# Patient Record
Sex: Male | Born: 1955 | Race: Black or African American | Hispanic: No | Marital: Married | Smoking: Former smoker
Health system: Southern US, Community
[De-identification: ages and names within clinical notes are randomized; demographics above are authoritative.]

## PROBLEM LIST (undated history)

## (undated) DIAGNOSIS — E119 Type 2 diabetes mellitus without complications: Secondary | ICD-10-CM

## (undated) DIAGNOSIS — E785 Hyperlipidemia, unspecified: Secondary | ICD-10-CM

## (undated) DIAGNOSIS — I10 Essential (primary) hypertension: Secondary | ICD-10-CM

## (undated) HISTORY — DX: Hyperlipidemia, unspecified: E78.5

## (undated) HISTORY — DX: Type 2 diabetes mellitus without complications: E11.9

---

## 1982-07-28 HISTORY — PX: CYST EXCISION PERINEAL: SHX6278

## 1998-12-09 ENCOUNTER — Inpatient Hospital Stay (HOSPITAL_COMMUNITY): Admission: EM | Admit: 1998-12-09 | Discharge: 1998-12-10 | Payer: Self-pay | Admitting: Emergency Medicine

## 2009-05-01 ENCOUNTER — Ambulatory Visit: Payer: Self-pay | Admitting: Oncology

## 2009-05-28 LAB — CBC WITH DIFFERENTIAL (CANCER CENTER ONLY)
BASO%: 0.4 % (ref 0.0–2.0)
LYMPH#: 2 10*3/uL (ref 0.9–3.3)
LYMPH%: 36.7 % (ref 14.0–48.0)
MONO#: 0.3 10*3/uL (ref 0.1–0.9)
NEUT#: 3 10*3/uL (ref 1.5–6.5)
Platelets: 145 10*3/uL (ref 145–400)
RDW: 12.3 % (ref 10.5–14.6)
WBC: 5.4 10*3/uL (ref 4.0–10.0)

## 2009-05-28 LAB — CMP (CANCER CENTER ONLY)
AST: 30 U/L (ref 11–38)
Albumin: 4 g/dL (ref 3.3–5.5)
Alkaline Phosphatase: 65 U/L (ref 26–84)
BUN, Bld: 10 mg/dL (ref 7–22)
Potassium: 4 mEq/L (ref 3.3–4.7)
Sodium: 143 mEq/L (ref 128–145)
Total Protein: 7.3 g/dL (ref 6.4–8.1)

## 2009-05-28 LAB — MORPHOLOGY - CHCC SATELLITE: PLT EST ~~LOC~~: ADEQUATE

## 2009-05-28 LAB — URINALYSIS, MICROSCOPIC (CHCC SATELLITE)
Bacteria, UA: NEGATIVE
Glucose: NEGATIVE g/dL
Ketones: NEGATIVE mg/dL
Specific Gravity, Urine: 1.015 (ref 1.003–1.035)
WBC: NEGATIVE (ref 0–2)

## 2009-06-01 LAB — IRON AND TIBC
%SAT: 25 % (ref 20–55)
Iron: 75 ug/dL (ref 42–165)
TIBC: 305 ug/dL (ref 215–435)

## 2009-06-01 LAB — HAPTOGLOBIN: Haptoglobin: 165 mg/dL (ref 16–200)

## 2009-06-01 LAB — VITAMIN B12: Vitamin B-12: 285 pg/mL (ref 211–911)

## 2009-06-01 LAB — HEMOGLOBINOPATHY EVALUATION
Hgb A: 55.8 % — ABNORMAL LOW (ref 96.8–97.8)
Hgb F Quant: 0 % (ref 0.0–2.0)

## 2009-06-01 LAB — PROTEIN ELECTROPHORESIS, SERUM
Albumin ELP: 62.2 % (ref 55.8–66.1)
Alpha-1-Globulin: 3.9 % (ref 2.9–4.9)
Beta 2: 4 % (ref 3.2–6.5)
Beta Globulin: 6.1 % (ref 4.7–7.2)
Gamma Globulin: 14.8 % (ref 11.1–18.8)

## 2009-06-01 LAB — FERRITIN: Ferritin: 417 ng/mL — ABNORMAL HIGH (ref 22–322)

## 2009-06-01 LAB — ERYTHROPOIETIN: Erythropoietin: 17.9 m[IU]/mL (ref 2.6–34.0)

## 2009-06-01 LAB — RETICULOCYTES (CHCC): RBC.: 4.42 MIL/uL (ref 4.22–5.81)

## 2009-06-20 ENCOUNTER — Ambulatory Visit: Payer: Self-pay | Admitting: Oncology

## 2011-10-21 ENCOUNTER — Ambulatory Visit (INDEPENDENT_AMBULATORY_CARE_PROVIDER_SITE_OTHER): Payer: BC Managed Care – PPO | Admitting: Internal Medicine

## 2011-10-21 VITALS — BP 172/113 | HR 69 | Temp 98.0°F | Resp 16 | Ht 67.5 in | Wt 246.8 lb

## 2011-10-21 DIAGNOSIS — M549 Dorsalgia, unspecified: Secondary | ICD-10-CM

## 2011-10-21 DIAGNOSIS — D573 Sickle-cell trait: Secondary | ICD-10-CM | POA: Insufficient documentation

## 2011-10-21 DIAGNOSIS — I10 Essential (primary) hypertension: Secondary | ICD-10-CM

## 2011-10-21 DIAGNOSIS — Z6838 Body mass index (BMI) 38.0-38.9, adult: Secondary | ICD-10-CM | POA: Insufficient documentation

## 2011-10-21 DIAGNOSIS — E782 Mixed hyperlipidemia: Secondary | ICD-10-CM

## 2011-10-21 DIAGNOSIS — E785 Hyperlipidemia, unspecified: Secondary | ICD-10-CM | POA: Insufficient documentation

## 2011-10-21 LAB — LIPID PANEL
HDL: 39 mg/dL — ABNORMAL LOW (ref >39)
LDL Cholesterol: 139 mg/dL — ABNORMAL HIGH (ref 0–99)
Total CHOL/HDL Ratio: 5.8 Ratio
Triglycerides: 237 mg/dL — ABNORMAL HIGH (ref <150)
VLDL: 47 mg/dL — ABNORMAL HIGH (ref 0–40)

## 2011-10-21 LAB — POCT CBC
MCH, POC: 28.1 pg (ref 27–31.2)
MCV: 87 fL (ref 80–97)
MID (cbc): 0.3 (ref 0–0.9)
POC LYMPH PERCENT: 47 %L (ref 10–50)
Platelet Count, POC: 168 10*3/uL (ref 142–424)
RBC: 4.38 M/uL — AB (ref 4.69–6.13)
WBC: 5.7 10*3/uL (ref 4.6–10.2)

## 2011-10-21 LAB — COMPREHENSIVE METABOLIC PANEL
Alkaline Phosphatase: 64 U/L (ref 39–117)
BUN: 10 mg/dL (ref 6–23)
Glucose, Bld: 76 mg/dL (ref 70–99)
Sodium: 144 mEq/L (ref 135–145)
Total Bilirubin: 0.6 mg/dL (ref 0.3–1.2)

## 2011-10-21 MED ORDER — MELOXICAM 15 MG PO TABS: 15.0000 mg | ORAL_TABLET | Freq: Every day | ORAL | Status: DC

## 2011-10-21 MED ORDER — LISINOPRIL-HYDROCHLOROTHIAZIDE 20-12.5 MG PO TABS: 1.0000 | ORAL_TABLET | Freq: Every day | ORAL | Status: DC

## 2011-10-21 MED ORDER — SIMVASTATIN 40 MG PO TABS: 40.0000 mg | ORAL_TABLET | Freq: Every evening | ORAL | Status: DC

## 2011-10-21 MED ORDER — CYCLOBENZAPRINE HCL 10 MG PO TABS: 10.0000 mg | ORAL_TABLET | Freq: Every day | ORAL | Status: AC

## 2011-10-21 MED ORDER — LISINOPRIL 20 MG PO TABS: 20.0000 mg | ORAL_TABLET | Freq: Every day | ORAL | Status: DC
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
## 2011-10-21 NOTE — Patient Instructions (Signed)
Back Exercises Back exercises help treat and prevent back injuries. The goal of back exercises is to increase the strength of your abdominal and back muscles and the flexibility of your back. These exercises should be started when you no longer have back pain. Back exercises include:  Pelvic Tilt. Lie on your back with your knees bent. Tilt your pelvis until the lower part of your back is against the floor. Hold this position 5 to 10 sec and repeat 5 to 10 times.   Knee to Chest. Pull first 1 knee up against your chest and hold for 20 to 30 seconds, repeat this with the other knee, and then both knees. This may be done with the other leg straight or bent, whichever feels better.   Sit-Ups or Curl-Ups. Bend your knees 90 degrees. Start with tilting your pelvis, and do a partial, slow sit-up, lifting your trunk only 30 to 45 degrees off the floor. Take at least 2 to 3 seconds for each sit-up. Do not do sit-ups with your knees out straight. If partial sit-ups are difficult, simply do the above but with only tightening your abdominal muscles and holding it as directed.   Hip-Lift. Lie on your back with your knees flexed 90 degrees. Push down with your feet and shoulders as you raise your hips a couple inches off the floor; hold for 10 seconds, repeat 5 to 10 times.   Back arches. Lie on your stomach, propping yourself up on bent elbows. Slowly press on your hands, causing an arch in your low back. Repeat 3 to 5 times. Any initial stiffness and discomfort should lessen with repetition over time.   Shoulder-Lifts. Lie face down with arms beside your body. Keep hips and torso pressed to floor as you slowly lift your head and shoulders off the floor.  Do not overdo your exercises, especially in the beginning. Exercises may cause you some mild back discomfort which lasts for a few minutes; however, if the pain is more severe, or lasts for more than 15 minutes, do not continue exercises until you see your  caregiver. Improvement with exercise therapy for back problems is slow.  See your caregivers for assistance with developing a proper back exercise program. Document Released: 08/21/2004 Document Revised: 07/03/2011 Document Reviewed: 07/14/2005 ExitCare Patient Information 2012 [employer]. 

## 2011-10-28 ENCOUNTER — Encounter: Payer: Self-pay | Admitting: Internal Medicine

## 2012-05-22 ENCOUNTER — Other Ambulatory Visit: Payer: Self-pay | Admitting: Internal Medicine

## 2012-06-25 ENCOUNTER — Other Ambulatory Visit: Payer: Self-pay | Admitting: Internal Medicine

## 2012-07-17 ENCOUNTER — Other Ambulatory Visit: Payer: Self-pay | Admitting: Physician Assistant

## 2012-07-22 ENCOUNTER — Other Ambulatory Visit: Payer: Self-pay | Admitting: Physician Assistant

## 2012-08-26 ENCOUNTER — Other Ambulatory Visit: Payer: Self-pay | Admitting: Physician Assistant

## 2012-08-26 NOTE — Telephone Encounter (Signed)
4th notice - pt way overdue for visit and labs

## 2012-09-25 ENCOUNTER — Other Ambulatory Visit: Payer: Self-pay | Admitting: Internal Medicine

## 2012-10-05 ENCOUNTER — Other Ambulatory Visit: Payer: Self-pay | Admitting: Internal Medicine

## 2012-10-05 ENCOUNTER — Ambulatory Visit (INDEPENDENT_AMBULATORY_CARE_PROVIDER_SITE_OTHER): Payer: BC Managed Care – PPO | Admitting: Emergency Medicine

## 2012-10-05 VITALS — BP 180/110 | HR 77 | Temp 98.1°F | Resp 18 | Wt 252.0 lb

## 2012-10-05 DIAGNOSIS — S83207A Unspecified tear of unspecified meniscus, current injury, left knee, initial encounter: Secondary | ICD-10-CM

## 2012-10-05 DIAGNOSIS — I10 Essential (primary) hypertension: Secondary | ICD-10-CM

## 2012-10-05 DIAGNOSIS — IMO0002 Reserved for concepts with insufficient information to code with codable children: Secondary | ICD-10-CM

## 2012-10-05 MED ORDER — LISINOPRIL 20 MG PO TABS: 20.0000 mg | ORAL_TABLET | Freq: Every day | ORAL | Status: DC

## 2012-10-05 MED ORDER — LISINOPRIL-HYDROCHLOROTHIAZIDE 20-12.5 MG PO TABS: 1.0000 | ORAL_TABLET | Freq: Every day | ORAL | Status: DC

## 2012-10-05 MED ORDER — HYDROCODONE-ACETAMINOPHEN 5-500 MG PO TABS: 1.0000 | ORAL_TABLET | ORAL | Status: DC | PRN

## 2012-10-05 NOTE — Patient Instructions (Addendum)
Knee, Cartilage and its Function  The menisci are made of tough cartilage, and fit between the surfaces of the bones upon which they rest. The menisci are semi lunar (C-shaped) and have a wedged profile. The wedged profile helps the stability of the joint by keeping the rounded femur surface from sliding off the flat tibial surface. The menisci are nourished (fed) by small blood vessels; but there is also a large area in the center of the meniscus that does not have a good blood supply (avascular). This presents a problem when there is an injury to the meniscus, as areas without good blood supply heal poorly. When there is a torn cartilage in the knee, surgery is often required to fix this. This is usually done with an arthroscopy (a surgical procedure less invasive than open surgery). Some times open surgery of the knee is required if there is other damage which has occurred.  The medial meniscus rests on the medial tibial plateau. The tibia is the large bone in your lower leg (the shin bone). The medial tibial plateau is the upper end of the bone making up the inner part of your knee. The lateral meniscus serves the same purpose and is located on the outside of the knee. The menisci help to distribute your body weight across the knee joint. Without the meniscus present, the weight of your body would be unevenly applied to the bones in your legs (the femur and tibia). The femur is the large bone in your thigh. This uneven weight distribution would cause increased wear and tear on the cartilage covering the bones, leading to early damage of these areas. The presence of the menisci cartilage is necessary for a healthy knee.  PURPOSE OF THE KNEE CARTILAGE   The knee joint is made up of three bones: the thigh bone (femur), the shin bone (tibia), and the knee cap (patella). The surfaces of these bones at the knee joint are covered with cartilage. This smooth, slippery surface allows the bones to slide against each other without causing bone damage. The meniscus sits between these cartilaginous surfaces of the bones (femur and tibia). It distributes the weight evenly in the joints and helps with the stability of the joint (keeps the joint steady).  HOME CARE INSTRUCTIONS  After surgery:   Use crutches as instructed.   Once home, an ice pack applied to your injury may help with discomfort and keep the swelling down. An ice pack can be used for 15 to 20 minutes 3 to 4 times per day for the first 2-3 days or as instructed.   Only take over-the-counter or prescription medicines for pain, discomfort, or fever as directed by your caregiver.   Call if you do not have relief of pain with medications or if there is an increase in pain.   Call if your foot becomes cold or blue.   Call if your knee gets stuck in a fixed position and will not move.   You may resume normal diet and activities as directed.   Make sure to keep your appointment with your follow-up caregiver. This injury may require further evaluation and treatment beyond the treatment you received today.  MAKE SURE YOU:     Understand these instructions.   Will watch your condition.   Will get help right away if you are not doing well or get worse.  Document Released: 01/03/2002 Document Revised: 10/06/2011 Document Reviewed: 05/16/2009  ExitCare Patient Information 2013 [employer].

## 2012-10-05 NOTE — Progress Notes (Signed)
[facility] [address], [place] [address] [phone]  Date:  10/05/2012   Name:  Alexander Cochran   DOB:  04-Aug-1955   MRN:  [id number]  PCP:  No primary Laverna Dossett on file.    Chief Complaint: Knee Pain   History of Present Illness:  Alexander Cochran is a 57 y.o. very pleasant male patient who presents with the following:  Sitting in chair Saturday evening and stood up to go to another room.  He felt an immediate pain and swelling in his left knee.  Has been unable to easily bear weight since.  Denies prior knee injury or other complaint.  No improvement last night with ace and icey hot.  Ran out of antihypertensive medication over a week ago. No symptoms.  Patient Active Problem List  Diagnosis  . BMI 38.0-38.9,adult  . Hemoglobin S-A disorder  . Hyperlipemia  . HTN (hypertension)    No past medical history on file.  No past surgical history on file.  History  Substance Use Topics  . Smoking status: Former Games developer  . Smokeless tobacco: Not on file  . Alcohol Use: Yes    Family History  Problem Relation Age of Onset  . Hypertension Sister   . Hypertension Brother     No Known Allergies  Medication list has been reviewed and updated.  Current Outpatient Prescriptions on File Prior to Visit  Medication Sig Dispense Refill  . lisinopril (PRINIVIL,ZESTRIL) 20 MG tablet Take 1 tablet (20 mg total) by mouth daily. NEED VISIT - 4th NOTICE!!!  15 tablet  0  . lisinopril-hydrochlorothiazide (PRINZIDE,ZESTORETIC) 20-12.5 MG per tablet TAKE 1 TABLET BY MOUTH EVERY DAY  30 tablet  0  . simvastatin (ZOCOR) 40 MG tablet Take 1 tablet (40 mg total) by mouth at bedtime. NEED VISIT - 4th NOTICE!!!  15 tablet  0   No current facility-administered medications on file prior to visit.    Review of Systems:  As per HPI, otherwise negative.    Physical Examination: Filed Vitals:   10/05/12 1030  BP: 180/110  Pulse: 77  Temp: 98.1 F (36.7 C)   Resp: 18   Filed Vitals:   10/05/12 1030  Weight: 252 lb (114.306 kg)   Body mass index is 38.86 kg/(m^2). Ideal Body Weight:     GEN: WDWN, NAD, Non-toxic, Alert & Oriented x 3 HEENT: Atraumatic, Normocephalic.  Ears and Nose: No external deformity. EXTR: No clubbing/cyanosis/edema NEURO: Normal gait.  PSYCH: Normally interactive. Conversant. Not depressed or anxious appearing.  Calm demeanor.  LEFT KNEE:  Moderate effusion.  Guards extension past 160 degrees or flexion past 120 degrees.  Joint stable grossly  Assessment and Plan: Meniscus tear left knee Crutches Knee immobilizer vicodin Hypertension Non compliance Refill meds Follow up in one month  Carmelina Dane, MD

## 2012-11-07 ENCOUNTER — Other Ambulatory Visit: Payer: Self-pay | Admitting: Emergency Medicine

## 2012-11-07 ENCOUNTER — Other Ambulatory Visit: Payer: Self-pay | Admitting: Physician Assistant

## 2013-01-08 ENCOUNTER — Other Ambulatory Visit: Payer: Self-pay | Admitting: Physician Assistant

## 2013-03-05 ENCOUNTER — Other Ambulatory Visit: Payer: Self-pay | Admitting: Physician Assistant

## 2013-04-10 ENCOUNTER — Other Ambulatory Visit: Payer: Self-pay | Admitting: Emergency Medicine

## 2013-04-10 ENCOUNTER — Other Ambulatory Visit: Payer: Self-pay | Admitting: Physician Assistant

## 2013-04-10 NOTE — Telephone Encounter (Signed)
Needs OV, labs 

## 2013-05-07 ENCOUNTER — Other Ambulatory Visit: Payer: Self-pay | Admitting: Physician Assistant

## 2013-08-31 ENCOUNTER — Ambulatory Visit (INDEPENDENT_AMBULATORY_CARE_PROVIDER_SITE_OTHER): Payer: BC Managed Care – PPO | Admitting: Emergency Medicine

## 2013-08-31 VITALS — BP 202/120 | HR 77 | Temp 98.4°F | Resp 18 | Wt 262.0 lb

## 2013-08-31 DIAGNOSIS — Z9114 Patient's other noncompliance with medication regimen: Secondary | ICD-10-CM

## 2013-08-31 DIAGNOSIS — Z91199 Patient's noncompliance with other medical treatment and regimen due to unspecified reason: Secondary | ICD-10-CM

## 2013-08-31 DIAGNOSIS — Z9119 Patient's noncompliance with other medical treatment and regimen: Secondary | ICD-10-CM

## 2013-08-31 DIAGNOSIS — I10 Essential (primary) hypertension: Secondary | ICD-10-CM

## 2013-08-31 DIAGNOSIS — Z6838 Body mass index (BMI) 38.0-38.9, adult: Secondary | ICD-10-CM

## 2013-08-31 DIAGNOSIS — E785 Hyperlipidemia, unspecified: Secondary | ICD-10-CM

## 2013-08-31 LAB — COMPREHENSIVE METABOLIC PANEL
ALBUMIN: 4.7 g/dL (ref 3.5–5.2)
ALK PHOS: 66 U/L (ref 39–117)
ALT: 23 U/L (ref 0–53)
AST: 17 U/L (ref 0–37)
BUN: 11 mg/dL (ref 6–23)
CO2: 27 mEq/L (ref 19–32)
Calcium: 9.7 mg/dL (ref 8.4–10.5)
Chloride: 106 mEq/L (ref 96–112)
Creat: 1.16 mg/dL (ref 0.50–1.35)
GLUCOSE: 93 mg/dL (ref 70–99)
Potassium: 4.1 mEq/L (ref 3.5–5.3)
Sodium: 144 mEq/L (ref 135–145)
Total Bilirubin: 0.7 mg/dL (ref 0.2–1.2)
Total Protein: 7.8 g/dL (ref 6.0–8.3)

## 2013-08-31 LAB — POCT CBC
Granulocyte percent: 50.4 %G (ref 37–80)
HEMATOCRIT: 45 % (ref 43.5–53.7)
HEMOGLOBIN: 14.1 g/dL (ref 14.1–18.1)
Lymph, poc: 2 (ref 0.6–3.4)
MCH, POC: 29.2 pg (ref 27–31.2)
MCHC: 31.3 g/dL — AB (ref 31.8–35.4)
MCV: 93.1 fL (ref 80–97)
MID (cbc): 0.2 (ref 0–0.9)
MPV: 13.1 fL (ref 0–99.8)
POC GRANULOCYTE: 2.3 (ref 2–6.9)
POC LYMPH PERCENT: 44.2 %L (ref 10–50)
POC MID %: 5.4 % (ref 0–12)
Platelet Count, POC: 153 10*3/uL (ref 142–424)
RBC: 4.83 M/uL (ref 4.69–6.13)
RDW, POC: 13.5 %
WBC: 4.5 10*3/uL — AB (ref 4.6–10.2)

## 2013-08-31 LAB — POCT URINALYSIS DIPSTICK
BILIRUBIN UA: NEGATIVE
Blood, UA: NEGATIVE
GLUCOSE UA: NEGATIVE
Ketones, UA: NEGATIVE
LEUKOCYTES UA: NEGATIVE
NITRITE UA: NEGATIVE
PH UA: 5.5
Protein, UA: NEGATIVE
Spec Grav, UA: 1.02
Urobilinogen, UA: 0.2

## 2013-08-31 LAB — LIPID PANEL
Cholesterol: 237 mg/dL — ABNORMAL HIGH (ref 0–200)
HDL: 40 mg/dL (ref >39)
LDL Cholesterol: 137 mg/dL — ABNORMAL HIGH (ref 0–99)
TRIGLYCERIDES: 300 mg/dL — AB (ref <150)
Total CHOL/HDL Ratio: 5.9 Ratio
VLDL: 60 mg/dL — ABNORMAL HIGH (ref 0–40)

## 2013-08-31 MED ORDER — LISINOPRIL-HYDROCHLOROTHIAZIDE 20-12.5 MG PO TABS: 1.0000 | ORAL_TABLET | Freq: Every day | ORAL | Status: DC

## 2013-08-31 MED ORDER — SIMVASTATIN 40 MG PO TABS: 40.0000 mg | ORAL_TABLET | Freq: Every day | ORAL | Status: DC

## 2013-08-31 NOTE — Patient Instructions (Signed)

## 2013-08-31 NOTE — Progress Notes (Signed)
[facility] [address] [place] [address] [phone]  Date:  08/31/2013   Name:  Alexander Cochran   DOB:  03-27-1956   MRN:  [id number]  PCP:  No primary provider on file.    Chief Complaint: Medication Refill   History of Present Illness:  Alexander Cochran is a 58 y.o. very pleasant male patient who presents with the following:  History of uncontrolled HBP.  Last March BP was 180/110 and today is 200/120.  Not symptomatic.  Not compliant.  Out of meds for 1 month.  Fasting.  Non smoker.  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  He was informed.  No improvement with over the counter medications or other home remedies. Denies other complaint or health concern today.   Patient Active Problem List   Diagnosis Date Noted  . BMI 38.0-38.9,adult 10/21/2011  . 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  . Hyperlipemia 10/21/2011  . HTN (hypertension) 10/21/2011    No past medical history on file.  No past surgical history on file.  History  Substance Use Topics  . Smoking status: Former Research scientist (life sciences)  . Smokeless tobacco: Not on file  . Alcohol Use: Yes    Family History  Problem Relation Age of Onset  . Hypertension Sister   . Hypertension Brother     No Known Allergies  Medication list has been reviewed and updated.  Current Outpatient Prescriptions on File Prior to Visit  Medication Sig Dispense Refill  . lisinopril-hydrochlorothiazide (PRINZIDE,ZESTORETIC) 20-12.5 MG per tablet Take 1 tablet by mouth daily. NEED VISIT, LABS!!  30 tablet  0  . simvastatin (ZOCOR) 40 MG tablet Take 1 tablet (40 mg total) by mouth at bedtime. NEED VISIT, LABS!!  30 tablet  0   No current facility-administered medications on file prior to visit.    Review of Systems:  As per HPI, otherwise negative.    Physical Examination: Filed Vitals:   08/31/13 0907  BP: 202/120  Pulse: 77  Temp: 98.4 F (36.9 C)  Resp: 18   Filed Vitals:   08/31/13  0907  Weight: 262 lb (118.842 kg)   Body mass index is 40.41 kg/(m^2). Ideal Body Weight:    GEN: very obese, NAD, Non-toxic, A & O x 3 HEENT: Atraumatic, Normocephalic. Neck supple. No masses, No LAD. Ears and Nose: No external deformity. CV: RRR, No M/G/R. No JVD. No thrill. No extra heart sounds. PULM: CTA B, no wheezes, crackles, rhonchi. No retractions. No resp. distress. No accessory muscle use. ABD: S, NT, ND, +BS. No rebound. No HSM. EXTR: No c/c/e NEURO Normal gait.  PSYCH: Normally interactive. Conversant. Not depressed or anxious appearing.  Calm demeanor.    Assessment and Plan: Hypertension.  Refill meds and follow up in a month for recheck Hyperlipidemia Non compliance Overweight Follow up in the next thirty days   Signed,  Ellison Carwin, MD

## 2013-09-01 ENCOUNTER — Telehealth: Payer: Self-pay | Admitting: Radiology

## 2013-09-01 LAB — TSH: TSH: 0.955 u[IU]/mL (ref 0.350–4.500)

## 2013-09-01 LAB — PSA: PSA: 0.72 ng/mL (ref <=4.00)

## 2013-10-05 ENCOUNTER — Encounter: Payer: Self-pay | Admitting: Emergency Medicine

## 2013-10-24 ENCOUNTER — Other Ambulatory Visit: Payer: Self-pay | Admitting: Emergency Medicine

## 2013-11-22 ENCOUNTER — Other Ambulatory Visit: Payer: Self-pay | Admitting: Emergency Medicine

## 2013-12-04 ENCOUNTER — Ambulatory Visit (INDEPENDENT_AMBULATORY_CARE_PROVIDER_SITE_OTHER): Payer: BC Managed Care – PPO | Admitting: Family Medicine

## 2013-12-04 VITALS — BP 124/84 | HR 87 | Temp 98.2°F | Resp 14 | Ht 66.5 in | Wt 262.0 lb

## 2013-12-04 DIAGNOSIS — I10 Essential (primary) hypertension: Secondary | ICD-10-CM

## 2013-12-04 DIAGNOSIS — E785 Hyperlipidemia, unspecified: Secondary | ICD-10-CM

## 2013-12-04 LAB — COMPLETE METABOLIC PANEL WITH GFR
ALT: 24 U/L (ref 0–53)
AST: 18 U/L (ref 0–37)
CO2: 28 mEq/L (ref 19–32)
Calcium: 9.4 mg/dL (ref 8.4–10.5)
Chloride: 106 mEq/L (ref 96–112)
Creat: 1.42 mg/dL — ABNORMAL HIGH (ref 0.50–1.35)
GFR, Est African American: 63 mL/min
GFR, Est Non African American: 55 mL/min — ABNORMAL LOW
Sodium: 143 mEq/L (ref 135–145)
Total Protein: 6.9 g/dL (ref 6.0–8.3)

## 2013-12-04 LAB — COMPLETE METABOLIC PANEL WITHOUT GFR
Albumin: 4.6 g/dL (ref 3.5–5.2)
Alkaline Phosphatase: 61 U/L (ref 39–117)
BUN: 17 mg/dL (ref 6–23)
Glucose, Bld: 105 mg/dL — ABNORMAL HIGH (ref 70–99)
Potassium: 3.8 meq/L (ref 3.5–5.3)
Total Bilirubin: 0.6 mg/dL (ref 0.2–1.2)

## 2013-12-04 LAB — LIPID PANEL
Cholesterol: 137 mg/dL (ref 0–200)
HDL: 32 mg/dL — ABNORMAL LOW (ref >39)
LDL Cholesterol: 67 mg/dL (ref 0–99)
Total CHOL/HDL Ratio: 4.3 ratio
Triglycerides: 188 mg/dL — ABNORMAL HIGH (ref <150)
VLDL: 38 mg/dL (ref 0–40)

## 2013-12-04 LAB — MICROALBUMIN, URINE: Microalb, Ur: 0.5 mg/dL (ref 0.00–1.89)

## 2013-12-04 MED ORDER — LISINOPRIL-HYDROCHLOROTHIAZIDE 20-12.5 MG PO TABS: 1.0000 | ORAL_TABLET | Freq: Every day | ORAL | Status: DC

## 2013-12-04 MED ORDER — SIMVASTATIN 40 MG PO TABS: 40.0000 mg | ORAL_TABLET | Freq: Every day | ORAL | Status: DC

## 2013-12-04 NOTE — Progress Notes (Signed)
Chief Complaint:  Chief Complaint  Patient presents with  . Follow-up  . Medication Refill    HPI: Alexander Cochran is a 58 y.o. male who is here for : Hyperlipidemia since 1980 HTN  Since 1980 Strong family hx of HTN,no SEs He has been compliant, last does of meds was yeaterday morning He has no SE, denies CP, SOB, n/v/abd pain, msk pain No prior h.o MI/CVA HE does not take BP, he says he does but has not gotten a new machine, he tries to follow a low fat diet, not exercising right now.    Past Medical History  Diagnosis Date  . Hyperlipidemia    History reviewed. No pertinent past surgical history. History   Social History  . Marital Status: Married    Spouse Name: N/A    Number of Children: N/A  . Years of Education: N/A   Social History Main Topics  . Smoking status: Former Research scientist (life sciences)  . Smokeless tobacco: Never Used  . Alcohol Use: Yes  . Drug Use: No  . Sexual Activity: No   Other Topics Concern  . None   Social History Narrative  . None   Family History  Problem Relation Age of Onset  . Hypertension Sister   . Hypertension Brother    No Known Allergies Prior to Admission medications   Medication Sig Start Date End Date Taking? Authorizing Provider  lisinopril-hydrochlorothiazide (PRINZIDE,ZESTORETIC) 20-12.5 MG per tablet Take 1 tablet by mouth daily. PATIENT NEEDS OFFICE VISIT FOR ADDITIONAL REFILLS 10/24/13  Yes Ellison Carwin, MD  simvastatin (ZOCOR) 40 MG tablet Take 1 tablet (40 mg total) by mouth daily. PATIENT NEEDS OFFICE VISIT FOR ADDITIONAL REFILLS 10/24/13  Yes Ellison Carwin, MD     ROS: The patient denies fevers, chills, night sweats, unintentional weight loss, chest pain, palpitations, wheezing, dyspnea on exertion, nausea, vomiting, abdominal pain, dysuria, hematuria, melena, numbness, weakness, or tingling.   All other systems have been reviewed and were otherwise negative with the exception of those mentioned in the HPI and as  above.    PHYSICAL EXAM: Filed Vitals:   12/04/13 1022  BP: 124/84  Pulse: 87  Temp: 98.2 F (36.8 C)  Resp: 14   Filed Vitals:   12/04/13 1022  Height: 5' 6.5" (1.689 m)  Weight: 262 lb (118.842 kg)   Body mass index is 41.66 kg/(m^2).  General: Alert, no acute distress, obese AA male HEENT:  Normocephalic, atraumatic, oropharynx patent. EOMI, PERRLA Cardiovascular:  Regular rate and rhythm, no rubs murmurs or gallops.  No Carotid bruits, radial pulse intact. No pedal edema.  Respiratory: Clear to auscultation bilaterally.  No wheezes, rales, or rhonchi.  No cyanosis, no use of accessory musculature GI: No organomegaly, abdomen is soft and non-tender, positive bowel sounds.  No masses. Skin: No rashes. Neurologic: Facial musculature symmetric. Psychiatric: Patient is appropriate throughout our interaction. Lymphatic: No cervical lymphadenopathy Musculoskeletal: Gait intact.   LABS: Results for orders placed in visit on 08/31/13  COMPREHENSIVE METABOLIC PANEL      Result Value Ref Range   Sodium 144  135 - 145 mEq/L   Potassium 4.1  3.5 - 5.3 mEq/L   Chloride 106  96 - 112 mEq/L   CO2 27  19 - 32 mEq/L   Glucose, Bld 93  70 - 99 mg/dL   BUN 11  6 - 23 mg/dL   Creat 1.16  0.50 - 1.35 mg/dL   Total Bilirubin 0.7  0.2 - 1.2  mg/dL   Alkaline Phosphatase 66  39 - 117 U/L   AST 17  0 - 37 U/L   ALT 23  0 - 53 U/L   Total Protein 7.8  6.0 - 8.3 g/dL   Albumin 4.7  3.5 - 5.2 g/dL   Calcium 9.7  8.4 - 10.5 mg/dL  LIPID PANEL      Result Value Ref Range   Cholesterol 237 (*) 0 - 200 mg/dL   Triglycerides 300 (*) <150 mg/dL   HDL 40  >39 mg/dL   Total CHOL/HDL Ratio 5.9     VLDL 60 (*) 0 - 40 mg/dL   LDL Cholesterol 137 (*) 0 - 99 mg/dL  TSH      Result Value Ref Range   TSH 0.955  0.350 - 4.500 uIU/mL  PSA      Result Value Ref Range   PSA 0.72  <=4.00 ng/mL  POCT CBC      Result Value Ref Range   WBC 4.5 (*) 4.6 - 10.2 K/uL   Lymph, poc 2.0  0.6 - 3.4   POC  LYMPH PERCENT 44.2  10 - 50 %L   MID (cbc) 0.2  0 - 0.9   POC MID % 5.4  0 - 12 %M   POC Granulocyte 2.3  2 - 6.9   Granulocyte percent 50.4  37 - 80 %G   RBC 4.83  4.69 - 6.13 M/uL   Hemoglobin 14.1  14.1 - 18.1 g/dL   HCT, POC 45.0  43.5 - 53.7 %   MCV 93.1  80 - 97 fL   MCH, POC 29.2  27 - 31.2 pg   MCHC 31.3 (*) 31.8 - 35.4 g/dL   RDW, POC 13.5     Platelet Count, POC 153  142 - 424 K/uL   MPV 13.1  0 - 99.8 fL  POCT URINALYSIS DIPSTICK      Result Value Ref Range   Color, UA amber     Clarity, UA clear     Glucose, UA neg     Bilirubin, UA neg     Ketones, UA neg     Spec Grav, UA 1.020     Blood, UA neg     pH, UA 5.5     Protein, UA neg     Urobilinogen, UA 0.2     Nitrite, UA neg     Leukocytes, UA Negative       EKG/XRAY:   Primary read interpreted by Dr. Marin Comment at [facility].   ASSESSMENT/PLAN: Encounter Diagnoses  Name Primary?  . Other and unspecified hyperlipidemia Yes  . HTN (hypertension)    Refilled meds for 6 months Labs pending: CMP, TSH, lipids F/u in 6 months , if labs abnormal then will increase statin to 80 mg and recheck in 2 months   Gross sideeffects, risk and benefits, and alternatives of medications d/w patient. Patient is aware that all medications have potential sideeffects and we are unable to predict every sideeffect or drug-drug interaction that may occur.  Glenford Bayley, DO 12/04/2013 10:34 AM

## 2013-12-08 ENCOUNTER — Other Ambulatory Visit: Payer: Self-pay | Admitting: Family Medicine

## 2013-12-08 ENCOUNTER — Encounter: Payer: Self-pay | Admitting: Family Medicine

## 2013-12-08 DIAGNOSIS — R944 Abnormal results of kidney function studies: Secondary | ICD-10-CM

## 2014-05-27 ENCOUNTER — Other Ambulatory Visit: Payer: Self-pay | Admitting: Family Medicine

## 2014-06-23 ENCOUNTER — Other Ambulatory Visit: Payer: Self-pay | Admitting: Family Medicine

## 2014-07-09 ENCOUNTER — Other Ambulatory Visit: Payer: Self-pay | Admitting: Physician Assistant

## 2014-07-10 NOTE — Telephone Encounter (Signed)
LMOM for pt to CB and either set up appt or advise me when he can f/up.

## 2014-07-16 ENCOUNTER — Ambulatory Visit (INDEPENDENT_AMBULATORY_CARE_PROVIDER_SITE_OTHER): Payer: BC Managed Care – PPO | Admitting: Family Medicine

## 2014-07-16 VITALS — BP 134/88 | HR 68 | Temp 98.0°F | Resp 16 | Ht 68.0 in | Wt 270.0 lb

## 2014-07-16 DIAGNOSIS — I10 Essential (primary) hypertension: Secondary | ICD-10-CM

## 2014-07-16 DIAGNOSIS — E785 Hyperlipidemia, unspecified: Secondary | ICD-10-CM

## 2014-07-16 LAB — COMPREHENSIVE METABOLIC PANEL
ALT: 33 U/L (ref 0–53)
AST: 29 U/L (ref 0–37)
Albumin: 4.4 g/dL (ref 3.5–5.2)
Alkaline Phosphatase: 49 U/L (ref 39–117)
BUN: 9 mg/dL (ref 6–23)
CO2: 28 mEq/L (ref 19–32)
Calcium: 9.7 mg/dL (ref 8.4–10.5)
Chloride: 106 mEq/L (ref 96–112)
Creat: 1.33 mg/dL (ref 0.50–1.35)
Glucose, Bld: 105 mg/dL — ABNORMAL HIGH (ref 70–99)
Potassium: 4.1 mEq/L (ref 3.5–5.3)
Sodium: 143 mEq/L (ref 135–145)
Total Bilirubin: 0.6 mg/dL (ref 0.2–1.2)
Total Protein: 7 g/dL (ref 6.0–8.3)

## 2014-07-16 MED ORDER — SIMVASTATIN 40 MG PO TABS: 40.0000 mg | ORAL_TABLET | Freq: Every day | ORAL | Status: DC

## 2014-07-16 MED ORDER — LISINOPRIL-HYDROCHLOROTHIAZIDE 20-12.5 MG PO TABS: 1.0000 | ORAL_TABLET | Freq: Every day | ORAL | Status: DC

## 2014-07-16 NOTE — Progress Notes (Signed)
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 He was last seen in May of this year. At that time his cholesterol was excellent.  Patient is having no muscle soreness or nausea. He is fasting this morning and has been compliant with his medication.  When patient was seen last May is creatinine was elevated at 1.42. He denies any itching or decrease in urine flow.  Objective: This is an obese gentleman in no acute distress HEENT: Unremarkable Neck: Supple no adenopathy or thyromegaly Chest: Clear Heart: Regular no murmur Abdomen: Soft nontender without HSM or masses Skin: Some mild hyperpigmentation around his eyes and neck.  Assessment: Controlled hypertension and hyperlipidemia. Unexplained reason for a slight elevation in creatinine last time     ICD-9-CM ICD-10-CM   1. Hyperlipidemia 272.4 E78.5 Comprehensive metabolic panel     simvastatin (ZOCOR) 40 MG tablet  2. Essential hypertension 401.9 I10 lisinopril-hydrochlorothiazide (PRINZIDE,ZESTORETIC) 20-12.5 MG per tablet     Signed, Robyn Haber, MD   A low fat, low cholesterol is discussed with the patient, and a written copy is given to him.

## 2014-07-16 NOTE — Patient Instructions (Signed)

## 2014-09-03 ENCOUNTER — Ambulatory Visit (INDEPENDENT_AMBULATORY_CARE_PROVIDER_SITE_OTHER): Payer: BLUE CROSS/BLUE SHIELD | Admitting: Internal Medicine

## 2014-09-03 VITALS — BP 118/84 | HR 91 | Temp 98.4°F | Resp 18 | Ht 68.25 in | Wt 259.2 lb

## 2014-09-03 DIAGNOSIS — J0100 Acute maxillary sinusitis, unspecified: Secondary | ICD-10-CM

## 2014-09-03 MED ORDER — AMOXICILLIN 875 MG PO TABS: 875.0000 mg | ORAL_TABLET | Freq: Two times a day (BID) | ORAL | Status: DC

## 2014-09-03 NOTE — Progress Notes (Signed)
   Subjective:  This chart was scribed for Tami Lin, MD by Dellis Filbert, ED Scribe at [facility].The patient was seen in exam room 09 and the patient's care was started at 9:00 AM.   Patient ID: Alexander Cochran, male    DOB: [birth year], 59 y.o.   MRN: [id number] Chief Complaint  Patient presents with  . Sore Throat    x 1 week    HPI HPI Comments: Alexander Cochran is a 59 y.o. male who presents to [facility] complaining of a sore throat ongoing for one week. He initally  developed a cold which has improved but the sore throat has lingered. Coughing up plenty of phlegm in the morning but not trouble with sleeping. Salt water gurgle, tea and honey have not provided relief.   Patient Active Problem List   Diagnosis Date Noted  . BMI 38.0-38.9,adult 10/21/2011  . 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  . Hyperlipemia 10/21/2011  . HTN (hypertension) 10/21/2011   Past Medical History  Diagnosis Date  . Hyperlipidemia    History reviewed. No pertinent past surgical history. No Known Allergies Prior to Admission medications   Medication Sig Start Date End Date Taking? Authorizing Provider  lisinopril-hydrochlorothiazide (PRINZIDE,ZESTORETIC) 20-12.5 MG per tablet Take 1 tablet by mouth daily. 07/16/14  Yes Robyn Haber, MD  simvastatin (ZOCOR) 40 MG tablet Take 1 tablet (40 mg total) by mouth daily. 07/16/14  Yes Robyn Haber, MD   Review of Systems  HENT: Positive for sore throat.   Respiratory: Positive for cough.   Psychiatric/Behavioral: Negative for sleep disturbance.       Objective:  BP 118/84 mmHg  Pulse 91  Temp(Src) 98.4 F (36.9 C) (Oral)  Resp 18  Ht 5' 8.25" (1.734 m)  Wt 259 lb 3.2 oz (117.572 kg)  BMI 39.10 kg/m2  SpO2 97%  Physical Exam  Constitutional: He is oriented to person, place, and time. He appears well-developed and well-nourished. No distress.  HENT:  Head: Normocephalic and atraumatic.  Nares are boggy but no  discharge Throat is red with copious post nasal drainage. Tender to percussion of the maxillary areas  Eyes: Conjunctivae are normal. Pupils are equal, round, and reactive to light.  Neck: Normal range of motion. No thyromegaly present.  Cardiovascular: Normal rate, regular rhythm and normal heart sounds.   Pulmonary/Chest: Effort normal and breath sounds normal. No respiratory distress.  Musculoskeletal: Normal range of motion.  Lymphadenopathy:    He has no cervical adenopathy.  Neurological: He is alert and oriented to person, place, and time.  Skin: Skin is warm and dry.  Psychiatric: He has a normal mood and affect. His behavior is normal.  Nursing note and vitals reviewed.       Assessment & Plan:    I have completed the patient encounter in its entirety as documented by the scribe, with editing by me where necessary. Devarious Pavek P. Laney Pastor, M.D.  Acute maxillary sinusitis Meds ordered this encounter  Medications  . amoxicillin (AMOXIL) 875 MG tablet    Sig: Take 1 tablet (875 mg total) by mouth 2 (two) times daily.    Dispense:  20 tablet    Refill:  0

## 2014-12-13 ENCOUNTER — Other Ambulatory Visit: Payer: Self-pay | Admitting: Family Medicine

## 2014-12-31 ENCOUNTER — Ambulatory Visit (INDEPENDENT_AMBULATORY_CARE_PROVIDER_SITE_OTHER): Payer: BLUE CROSS/BLUE SHIELD | Admitting: Physician Assistant

## 2014-12-31 VITALS — BP 124/82 | HR 85 | Temp 99.0°F | Resp 20 | Ht 68.25 in | Wt 263.2 lb

## 2014-12-31 DIAGNOSIS — I10 Essential (primary) hypertension: Secondary | ICD-10-CM | POA: Diagnosis not present

## 2014-12-31 DIAGNOSIS — Z6839 Body mass index (BMI) 39.0-39.9, adult: Secondary | ICD-10-CM

## 2014-12-31 DIAGNOSIS — E785 Hyperlipidemia, unspecified: Secondary | ICD-10-CM

## 2014-12-31 LAB — CBC
HCT: 36.3 % — ABNORMAL LOW (ref 39.0–52.0)
Hemoglobin: 11.9 g/dL — ABNORMAL LOW (ref 13.0–17.0)
MCH: 29.2 pg (ref 26.0–34.0)
MCHC: 32.8 g/dL (ref 30.0–36.0)
MCV: 89 fL (ref 78.0–100.0)
MPV: 13.3 fL — ABNORMAL HIGH (ref 8.6–12.4)
Platelets: 156 10*3/uL (ref 150–400)
RBC: 4.08 MIL/uL — ABNORMAL LOW (ref 4.22–5.81)
RDW: 13 % (ref 11.5–15.5)
WBC: 4 10*3/uL (ref 4.0–10.5)

## 2014-12-31 LAB — COMPLETE METABOLIC PANEL WITH GFR
ALBUMIN: 4.3 g/dL (ref 3.5–5.2)
ALT: 26 U/L (ref 0–53)
AST: 19 U/L (ref 0–37)
Alkaline Phosphatase: 50 U/L (ref 39–117)
BILIRUBIN TOTAL: 0.7 mg/dL (ref 0.2–1.2)
BUN: 16 mg/dL (ref 6–23)
CALCIUM: 9.3 mg/dL (ref 8.4–10.5)
CHLORIDE: 109 meq/L (ref 96–112)
CO2: 28 mEq/L (ref 19–32)
Creat: 1.34 mg/dL (ref 0.50–1.35)
GFR, Est African American: 68 mL/min
GFR, Est Non African American: 58 mL/min — ABNORMAL LOW
Glucose, Bld: 109 mg/dL — ABNORMAL HIGH (ref 70–99)
Potassium: 3.9 mEq/L (ref 3.5–5.3)
SODIUM: 145 meq/L (ref 135–145)
TOTAL PROTEIN: 6.8 g/dL (ref 6.0–8.3)

## 2014-12-31 LAB — LIPID PANEL
CHOL/HDL RATIO: 4.7 ratio
Cholesterol: 127 mg/dL (ref 0–200)
HDL: 27 mg/dL — AB (ref >=40)
LDL Cholesterol: 66 mg/dL (ref 0–99)
Triglycerides: 172 mg/dL — ABNORMAL HIGH (ref <150)
VLDL: 34 mg/dL (ref 0–40)

## 2014-12-31 LAB — POCT URINALYSIS DIPSTICK
Bilirubin, UA: NEGATIVE
Glucose, UA: NEGATIVE
Ketones, UA: NEGATIVE
Leukocytes, UA: NEGATIVE
NITRITE UA: NEGATIVE
PROTEIN UA: NEGATIVE
RBC UA: NEGATIVE
SPEC GRAV UA: 1.02
UROBILINOGEN UA: 0.2
pH, UA: 5

## 2014-12-31 LAB — POCT GLYCOSYLATED HEMOGLOBIN (HGB A1C): HEMOGLOBIN A1C: 5.7

## 2014-12-31 MED ORDER — LISINOPRIL-HYDROCHLOROTHIAZIDE 20-12.5 MG PO TABS: 1.0000 | ORAL_TABLET | Freq: Every day | ORAL | Status: DC

## 2014-12-31 MED ORDER — SIMVASTATIN 40 MG PO TABS: 40.0000 mg | ORAL_TABLET | Freq: Every day | ORAL | Status: DC

## 2014-12-31 NOTE — Progress Notes (Signed)
12/31/2014 at 10:29 AM  Alexander Cochran / DOB: 23-Jan-1956 / MRN: [id number]  The patient has BMI 38.0-38.9,adult; Hemoglobin S-A disorder; Hyperlipemia; and HTN (hypertension) on his problem list.  SUBJECTIVE  Chief complaint: Medication Refill  Patient here for a refill of his HTN medication.  Reports he is out as of today.  He checks his pressure a few times per weeks and it measures at 135/85.  He denies HA, SOB, DOE, chest pain, cough, and nausea. Denies excessive thirst, and urinates once nightly.   Overall he feels well.  He has a history of hyperlipidemia and this was last check 1 year ago. He is not exercising.    He  has a past medical history of Hyperlipidemia.    Medications reviewed and updated by myself where necessary, and exist elsewhere in the encounter.   Alexander Cochran has No Known Allergies. He  reports that he has quit smoking. He has never used smokeless tobacco. He reports that he drinks alcohol. He reports that he does not use illicit drugs. He  reports that he does not engage in sexual activity. The patient  has no past surgical history on file.  His family history includes Hypertension in his brother and sister.  ROS  As noted in hpi, otherwise negative.   OBJECTIVE  His  height is 5' 8.25" (1.734 m) and weight is 263 lb 3.2 oz (119.387 kg). His oral temperature is 99 F (37.2 C). His blood pressure is 124/82 and his pulse is 85. His respiration is 20 and oxygen saturation is 96%.  The patient's body mass index is 39.71 kg/(m^2).  Physical Exam  Vitals reviewed. Constitutional: He is oriented to person, place, and time. He appears well-developed and well-nourished.  Eyes: EOM are normal. Pupils are equal, round, and reactive to light.  Cardiovascular: Normal rate.   Respiratory: Effort normal.  GI: Soft.  Musculoskeletal: Normal range of motion.  Neurological: He is alert and oriented to person, place, and time. No cranial nerve deficit.  Skin: Skin is warm and  dry.  Psychiatric: He has a normal mood and affect.    Results for orders placed or performed in visit on 12/31/14 (from the past 24 hour(s))  POCT urinalysis dipstick     Status: None   Collection Time: 12/31/14  9:41 AM  Result Value Ref Range   Color, UA yellow    Clarity, UA clear    Glucose, UA neg    Bilirubin, UA neg    Ketones, UA neg    Spec Grav, UA 1.020    Blood, UA neg    pH, UA 5.0    Protein, UA neg    Urobilinogen, UA 0.2    Nitrite, UA neg    Leukocytes, UA Negative   POCT glycosylated hemoglobin (Hb A1C)     Status: Normal   Collection Time: 12/31/14 10:06 AM  Result Value Ref Range   Hemoglobin A1C 5.7     ASSESSMENT & PLAN  Alexander Cochran was seen today for medication refill.  Diagnoses and all orders for this visit:  Essential hypertension Orders: -     COMPLETE METABOLIC PANEL WITH GFR -     CBC -     POCT urinalysis dipstick -     lisinopril-hydrochlorothiazide (PRINZIDE,ZESTORETIC) 20-12.5 MG per tablet; Take 1 tablet by mouth daily. -     POCT glycosylated hemoglobin (Hb A1C)  Hyperlipemia Orders: -     Lipid panel -     simvastatin (  ZOCOR) 40 MG tablet; Take 1 tablet (40 mg total) by mouth daily. -     POCT glycosylated hemoglobin (Hb A1C)  BMI 39.0-39.9,adult Orders: -     Cancel: Hemoglobin A1c -     POCT glycosylated hemoglobin (Hb A1C)    The patient was advised to call or come back to clinic if he does not see an improvement in symptoms, or worsens with the above plan.   Philis Fendt, MHS, PA-C [facility] 12/31/2014 10:29 AM

## 2015-01-04 NOTE — Progress Notes (Signed)
  Medical screening examination/treatment/procedure(s) were performed by non-physician practitioner and as supervising physician I was immediately available for consultation/collaboration.     

## 2015-01-31 ENCOUNTER — Encounter: Payer: Self-pay | Admitting: *Deleted

## 2015-06-06 ENCOUNTER — Other Ambulatory Visit: Payer: Self-pay | Admitting: Physician Assistant

## 2015-09-18 ENCOUNTER — Other Ambulatory Visit: Payer: Self-pay | Admitting: Physician Assistant

## 2015-12-05 ENCOUNTER — Other Ambulatory Visit: Payer: Self-pay | Admitting: Physician Assistant

## 2015-12-31 ENCOUNTER — Ambulatory Visit (INDEPENDENT_AMBULATORY_CARE_PROVIDER_SITE_OTHER): Payer: BLUE CROSS/BLUE SHIELD | Admitting: Physician Assistant

## 2015-12-31 VITALS — BP 120/84 | HR 82 | Temp 97.8°F | Resp 20 | Ht 68.25 in | Wt 266.4 lb

## 2015-12-31 DIAGNOSIS — E785 Hyperlipidemia, unspecified: Secondary | ICD-10-CM

## 2015-12-31 DIAGNOSIS — I10 Essential (primary) hypertension: Secondary | ICD-10-CM | POA: Diagnosis not present

## 2015-12-31 LAB — COMPLETE METABOLIC PANEL WITH GFR
ALT: 21 U/L (ref 9–46)
AST: 20 U/L (ref 10–35)
Albumin: 4.4 g/dL (ref 3.6–5.1)
Alkaline Phosphatase: 58 U/L (ref 40–115)
BILIRUBIN TOTAL: 0.6 mg/dL (ref 0.2–1.2)
BUN: 11 mg/dL (ref 7–25)
CALCIUM: 9.1 mg/dL (ref 8.6–10.3)
CO2: 26 mmol/L (ref 20–31)
CREATININE: 1.32 mg/dL (ref 0.70–1.33)
Chloride: 108 mmol/L (ref 98–110)
GFR, EST NON AFRICAN AMERICAN: 59 mL/min — AB (ref >=60)
GFR, Est African American: 68 mL/min (ref >=60)
Glucose, Bld: 108 mg/dL — ABNORMAL HIGH (ref 65–99)
Potassium: 3.9 mmol/L (ref 3.5–5.3)
Sodium: 144 mmol/L (ref 135–146)
TOTAL PROTEIN: 6.7 g/dL (ref 6.1–8.1)

## 2015-12-31 LAB — LIPID PANEL
Cholesterol: 142 mg/dL (ref 125–200)
HDL: 38 mg/dL — ABNORMAL LOW (ref >=40)
LDL CALC: 73 mg/dL (ref <130)
Total CHOL/HDL Ratio: 3.7 Ratio (ref <=5.0)
Triglycerides: 155 mg/dL — ABNORMAL HIGH (ref <150)
VLDL: 31 mg/dL — ABNORMAL HIGH (ref <30)

## 2015-12-31 NOTE — Patient Instructions (Addendum)
IF you received an x-ray today, you will receive an invoice from [facility]. Please contact [facility] at [phone] with questions or concerns regarding your invoice.   IF you received labwork today, you will receive an invoice from [employer]. Please contact [facility] at [phone] with questions or concerns regarding your invoice.   Our billing staff will not be able to assist you with questions regarding bills from these companies.  You will be contacted with the lab results as soon as they are available. The fastest way to get your results is to activate your My Chart account. Instructions are located on the last page of this paperwork. If you have not heard from [place] regarding the results in 2 weeks, please contact this office.    Please try to do daily stretches, and exercise of aerobic work.  There is a great swim class at the [facility] instructed by "Romie Minus" that provides low impact aerobic activity, without the back pain.  Something to consider.   Below is the dash diet, to avoid high sodium choices.   Please schedule your physical exam.  If you would like [place] to look at the back issue, please return.  Also, you need to have a colonoscopy.    DASH Eating Plan DASH stands for "Dietary Approaches to Stop Hypertension." The DASH eating plan is a healthy eating plan that has been shown to reduce high blood pressure (hypertension). Additional health benefits may include reducing the risk of type 2 diabetes mellitus, heart disease, and stroke. The DASH eating plan may also help with weight loss. WHAT DO I NEED TO KNOW ABOUT THE DASH EATING PLAN? For the DASH eating plan, you will follow these general guidelines:  Choose foods with a percent daily value for sodium of less than 5% (as listed on the food label).  Use salt-free seasonings or herbs instead of table salt or sea salt.  Check with your health care provider or pharmacist before using  salt substitutes.  Eat lower-sodium products, often labeled as "lower sodium" or "no salt added."  Eat fresh foods.  Eat more vegetables, fruits, and low-fat dairy products.  Choose whole grains. Look for the word "whole" as the first word in the ingredient list.  Choose fish and skinless chicken or [place] more often than red meat. Limit fish, poultry, and meat to 6 oz (170 g) each day.  Limit sweets, desserts, sugars, and sugary drinks.  Choose heart-healthy fats.  Limit cheese to 1 oz (28 g) per day.  Eat more home-cooked food and less restaurant, buffet, and fast food.  Limit fried foods.  Cook foods using methods other than frying.  Limit canned vegetables. If you do use them, rinse them well to decrease the sodium.  When eating at a restaurant, ask that your food be prepared with less salt, or no salt if possible. WHAT FOODS CAN I EAT? Seek help from a dietitian for individual calorie needs. Grains Whole grain or whole wheat bread. Brown rice. Whole grain or whole wheat pasta. Quinoa, bulgur, and whole grain cereals. Low-sodium cereals. Corn or whole wheat flour tortillas. Whole grain cornbread. Whole grain crackers. Low-sodium crackers. Vegetables Fresh or frozen vegetables (raw, steamed, roasted, or grilled). Low-sodium or reduced-sodium tomato and vegetable juices. Low-sodium or reduced-sodium tomato sauce and paste. Low-sodium or reduced-sodium canned vegetables.  Fruits All fresh, canned (in natural juice), or frozen fruits. Meat and Other Protein Products Ground beef (85% or leaner), grass-fed beef, or beef  trimmed of fat. Skinless chicken or [place]. Ground chicken or [place]. Pork trimmed of fat. All fish and seafood. Eggs. Dried beans, peas, or lentils. Unsalted nuts and seeds. Unsalted canned beans. Dairy Low-fat dairy products, such as skim or 1% milk, 2% or reduced-fat cheeses, low-fat ricotta or cottage cheese, or plain low-fat yogurt. Low-sodium or  reduced-sodium cheeses. Fats and Oils Tub margarines without trans fats. Light or reduced-fat mayonnaise and salad dressings (reduced sodium). Avocado. Safflower, olive, or canola oils. Natural peanut or almond butter. Other Unsalted popcorn and pretzels. The items listed above may not be a complete list of recommended foods or beverages. Contact your dietitian for more options. WHAT FOODS ARE NOT RECOMMENDED? Grains White bread. White pasta. White rice. Refined cornbread. Bagels and croissants. Crackers that contain trans fat. Vegetables Creamed or fried vegetables. Vegetables in a cheese sauce. Regular canned vegetables. Regular canned tomato sauce and paste. Regular tomato and vegetable juices. Fruits Dried fruits. Canned fruit in light or heavy syrup. Fruit juice. Meat and Other Protein Products Fatty cuts of meat. Ribs, chicken wings, bacon, sausage, bologna, salami, chitterlings, fatback, hot dogs, bratwurst, and packaged luncheon meats. Salted nuts and seeds. Canned beans with salt. Dairy Whole or 2% milk, cream, half-and-half, and cream cheese. Whole-fat or sweetened yogurt. Full-fat cheeses or blue cheese. Nondairy creamers and whipped toppings. Processed cheese, cheese spreads, or cheese curds. Condiments Onion and garlic salt, seasoned salt, table salt, and sea salt. Canned and packaged gravies. Worcestershire sauce. Tartar sauce. Barbecue sauce. Teriyaki sauce. Soy sauce, including reduced sodium. Steak sauce. Fish sauce. Oyster sauce. Cocktail sauce. Horseradish. Ketchup and mustard. Meat flavorings and tenderizers. Bouillon cubes. Hot sauce. Tabasco sauce. Marinades. Taco seasonings. Relishes. Fats and Oils Butter, stick margarine, lard, shortening, ghee, and bacon fat. Coconut, palm kernel, or palm oils. Regular salad dressings. Other Pickles and olives. Salted popcorn and pretzels. The items listed above may not be a complete list of foods and beverages to avoid. Contact your  dietitian for more information. WHERE CAN I FIND MORE INFORMATION? National Heart, Lung, and Blood Institute: travelstabloid.com   This information is not intended to replace advice given to you by your health care provider. Make sure you discuss any questions you have with your health care provider.   Document Released: 07/03/2011 Document Revised: 08/04/2014 Document Reviewed: 05/18/2013 Elsevier Interactive Patient Education [employer].

## 2016-01-01 ENCOUNTER — Other Ambulatory Visit: Payer: Self-pay | Admitting: Physician Assistant

## 2016-01-13 ENCOUNTER — Other Ambulatory Visit: Payer: Self-pay | Admitting: Physician Assistant

## 2016-01-24 NOTE — Progress Notes (Signed)
[facility] [address] [place] [address] [phone]  Date:  12/31/2015   Name:  Alexander Cochran   DOB:  02/04/1956   MRN:  [id number]  PCP:  No primary care provider on file.    History of Present Illness:  Alexander Cochran is a 60 y.o. male patient who presents to [facility] for cc of htn and hyperlipidemia. Patient has no current concerns or complaints.  No hx of tia, cp, palpitations, leg swelling, or sob.  Compliant on medication       Patient Active Problem List   Diagnosis Date Noted  . BMI 38.0-38.9,adult 10/21/2011  . 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[place]) 10/21/2011  . Hyperlipemia 10/21/2011  . HTN (hypertension) 10/21/2011    Past Medical History  Diagnosis Date  . Hyperlipidemia     History reviewed. No pertinent past surgical history.  Social History  Substance Use Topics  . Smoking status: Former Research scientist (life sciences)  . Smokeless tobacco: Never Used  . Alcohol Use: Yes    Family History  Problem Relation Age of Onset  . Hypertension Sister   . Hypertension Brother     No Known Allergies  Medication list has been reviewed and updated.  No current outpatient prescriptions on file prior to visit.   No current facility-administered medications on file prior to visit.    ROS ROS otherwise unremarkable unless listed above.   Physical Examination: BP 120/84 mmHg  Pulse 82  Temp(Src) 97.8 F (36.6 C) (Oral)  Resp 20  Ht 5' 8.25" (1.734 m)  Wt 266 lb 6.4 oz (120.838 kg)  BMI 40.19 kg/m2  SpO2 98% Ideal Body Weight: Weight in (lb) to have BMI = 25: 165.3  Physical Exam  Constitutional: He is oriented to person, place, and time. He appears well-developed and well-nourished. No distress.  HENT:  Head: Normocephalic and atraumatic.  Eyes: Conjunctivae and EOM are normal. Pupils are equal, round, and reactive to light.  Cardiovascular: Normal rate.   Pulmonary/Chest: Effort normal. No respiratory distress.  Neurological: He is alert and  oriented to person, place, and time.  Skin: Skin is warm and dry. He is not diaphoretic.  Psychiatric: He has a normal mood and affect. His behavior is normal.     Assessment and Plan: Alexander Cochran is a 60 y.o. male who is here today for medication recheck, follow up of htn and hl Refill given. Essential hypertension - Plan: COMPLETE METABOLIC PANEL WITH GFR, Lipid panel  Hyperlipidemia - Plan: COMPLETE METABOLIC PANEL WITH GFR, Lipid panel  Ivar Drape, PA-C [facility] 01/24/2016 9:19 AM

## 2016-05-13 ENCOUNTER — Encounter (HOSPITAL_COMMUNITY): Payer: Self-pay

## 2016-05-13 ENCOUNTER — Emergency Department (HOSPITAL_COMMUNITY)
Admission: EM | Admit: 2016-05-13 | Discharge: 2016-05-14 | Disposition: A | Discharge status: Home / Self Care | Payer: BLUE CROSS/BLUE SHIELD | Attending: Emergency Medicine | Admitting: Emergency Medicine

## 2016-05-13 DIAGNOSIS — I10 Essential (primary) hypertension: Secondary | ICD-10-CM | POA: Insufficient documentation

## 2016-05-13 DIAGNOSIS — Z79899 Other long term (current) drug therapy: Secondary | ICD-10-CM | POA: Insufficient documentation

## 2016-05-13 DIAGNOSIS — Z87891 Personal history of nicotine dependence: Secondary | ICD-10-CM | POA: Insufficient documentation

## 2016-05-13 DIAGNOSIS — M5416 Radiculopathy, lumbar region: Secondary | ICD-10-CM | POA: Insufficient documentation

## 2016-05-13 HISTORY — DX: Essential (primary) hypertension: I10

## 2016-05-13 MED ORDER — OXYCODONE-ACETAMINOPHEN 5-325 MG PO TABS
1.0000 | ORAL_TABLET | ORAL | Status: DC | PRN
Start: 2016-05-13 — End: 2016-05-14
  Administered 2016-05-13: 1 via ORAL
  Filled 2016-05-13: qty 1

## 2016-05-13 NOTE — ED Notes (Signed)
PT ADVISED NOT TO DRIVE AFTER RECEIVING THE MEDICATION. PT VERBALIZED UNDERSTANDING.

## 2016-05-13 NOTE — ED Triage Notes (Signed)
PT RECEIVED FROM HOME VIA EMS C/O LEFT FLANK PAIN RADIATING DOWN THE LEFT LEG WITH NUMBNESS AND TINGLING. 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 DENIES INJURY.

## 2016-05-14 ENCOUNTER — Emergency Department (HOSPITAL_COMMUNITY): Payer: BLUE CROSS/BLUE SHIELD

## 2016-05-14 LAB — URINALYSIS, ROUTINE W REFLEX MICROSCOPIC
Bilirubin Urine: NEGATIVE
Glucose, UA: NEGATIVE mg/dL
Ketones, ur: NEGATIVE mg/dL
Leukocytes, UA: NEGATIVE
NITRITE: NEGATIVE
PH: 5.5 (ref 5.0–8.0)
Protein, ur: NEGATIVE mg/dL
SPECIFIC GRAVITY, URINE: 1.016 (ref 1.005–1.030)

## 2016-05-14 LAB — URINE MICROSCOPIC-ADD ON

## 2016-05-14 MED ORDER — PREDNISONE 20 MG PO TABS: ORAL_TABLET | ORAL | 0 refills | Status: DC

## 2016-05-14 MED ORDER — ONDANSETRON HCL 4 MG/2ML IJ SOLN
4.0000 mg | Freq: Once | INTRAMUSCULAR | Status: AC
  Administered 2016-05-14: 4 mg via INTRAVENOUS
  Filled 2016-05-14: qty 2

## 2016-05-14 MED ORDER — DEXAMETHASONE SODIUM PHOSPHATE 10 MG/ML IJ SOLN
10.0000 mg | Freq: Once | INTRAMUSCULAR | Status: AC
  Administered 2016-05-14: 10 mg via INTRAVENOUS
  Filled 2016-05-14: qty 1

## 2016-05-14 MED ORDER — METHOCARBAMOL 500 MG PO TABS: 500.0000 mg | ORAL_TABLET | Freq: Two times a day (BID) | ORAL | 0 refills | Status: DC

## 2016-05-14 MED ORDER — OXYCODONE-ACETAMINOPHEN 5-325 MG PO TABS: 1.0000 | ORAL_TABLET | ORAL | 0 refills | Status: DC | PRN

## 2016-05-14 MED ORDER — HYDROMORPHONE HCL 1 MG/ML IJ SOLN
1.0000 mg | Freq: Once | INTRAMUSCULAR | Status: AC
  Administered 2016-05-14: 1 mg via INTRAVENOUS
  Filled 2016-05-14: qty 1

## 2016-05-14 MED ORDER — IBUPROFEN 800 MG PO TABS: 800.0000 mg | ORAL_TABLET | Freq: Three times a day (TID) | ORAL | 0 refills | Status: DC

## 2016-05-14 MED ORDER — HYDROMORPHONE HCL 2 MG/ML IJ SOLN
2.0000 mg | Freq: Once | INTRAMUSCULAR | Status: AC
  Administered 2016-05-14: 2 mg via INTRAVENOUS
  Filled 2016-05-14: qty 1

## 2016-05-14 MED ORDER — KETOROLAC TROMETHAMINE 30 MG/ML IJ SOLN
30.0000 mg | Freq: Once | INTRAMUSCULAR | Status: AC
  Administered 2016-05-14: 30 mg via INTRAVENOUS
  Filled 2016-05-14: qty 1

## 2016-05-14 NOTE — ED Notes (Signed)
MD at bedside. 

## 2016-05-14 NOTE — ED Notes (Signed)
Walked patient once around ED floor patient stated he had a dull pain in back and felt a little light headed.

## 2016-05-14 NOTE — ED Notes (Signed)
Pt placed on 2L oxygen since de -sating in 78's on room air. He is arousable to name. Mentation is WNL.  Pt states his pain is 9/10.  Family at bedside. HOB 45 degrees of semi -fowler were patient is able to tolerate due to discomfort in back.

## 2016-05-14 NOTE — ED Notes (Signed)
Family at bedside. Pt asleep, ar

## 2016-05-14 NOTE — ED Provider Notes (Signed)
[facility] DEPT Provider Note   CSN: [id number] Arrival date & time: 05/13/16  2054  By signing my name below, I, Higinio Plan, attest that this documentation has been prepared under the direction and in the presence of Orpah Greek, MD . Electronically Signed: Higinio Plan, Scribe. 05/14/2016. 12:20 AM.  History   Chief Complaint Chief Complaint  Patient presents with  . Flank Pain    LEFT   The history is provided by the patient. No language interpreter was used.   HPI Comments: Alexander Cochran is a 60 y.o. male with PMHx of HTN and HLD, brought in by EMS to the Emergency Department complaining of gradually worsening, left sided lower back pain that began this afternoon. 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 He states associated numbness that radiated down his left leg but notes this has improved now in the ED. He reports hx of lower back pain many years ago in which he saw a chiropractor with moderate relief.    Past Medical History:  Diagnosis Date  . Hyperlipidemia   . Hypertension     Patient Active Problem List   Diagnosis Date Noted  . BMI 38.0-38.9,adult 10/21/2011  . 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[place]) 10/21/2011  . Hyperlipemia 10/21/2011  . HTN (hypertension) 10/21/2011   History reviewed. No pertinent surgical history.  Home Medications    Prior to Admission medications   Medication Sig Start Date End Date Taking? Authorizing Provider  lisinopril-hydrochlorothiazide (PRINZIDE,ZESTORETIC) 20-12.5 MG tablet TAKE 1 TABLET BY MOUTH EVERY DAY 01/02/16  Yes Tereasa Coop, PA-C  simvastatin (ZOCOR) 40 MG tablet TAKE 1 TABLET BY MOUTH EVERY DAY 01/14/16  Yes Dorian Heckle English, PA    Family History Family History  Problem Relation Age of Onset  . Hypertension Sister   . Hypertension Brother     Social History Social History  Substance Use Topics  . Smoking status: Former Research scientist (life sciences)    . Smokeless tobacco: Never Used  . Alcohol use Yes     Allergies   Review of patient's allergies indicates no known allergies.   Review of Systems Review of Systems  Musculoskeletal: Positive for back pain.  Neurological: Positive for numbness.  All other systems reviewed and are negative.  Physical Exam Updated Vital Signs BP (!) 164/102 (BP Location: Left Arm)   Pulse 65   Temp 98.9 F (37.2 C) (Oral)   Resp 19   Ht 5\' 6"  (1.676 m)   Wt 250 lb (113.4 kg)   SpO2 98%   BMI 40.35 kg/m   Physical Exam  Constitutional: He is oriented to person, place, and time. He appears well-developed and well-nourished. No distress.  HENT:  Head: Normocephalic and atraumatic.  Right Ear: Hearing normal.  Left Ear: Hearing normal.  Nose: Nose normal.  Mouth/Throat: Oropharynx is clear and moist and mucous membranes are normal.  Eyes: Conjunctivae and EOM are normal. Pupils are equal, round, and reactive to light.  Neck: Normal range of motion. Neck supple.  Cardiovascular: Regular rhythm, S1 normal and S2 normal.  Exam reveals no gallop and no friction rub.   No murmur heard. Pulmonary/Chest: Effort normal and breath sounds normal. No respiratory distress. He exhibits no tenderness.  Abdominal: Soft. Normal appearance and bowel sounds are normal. There is no hepatosplenomegaly. There is no tenderness. There is no rebound, no guarding, no tenderness at McBurney's point and negative Murphy's sign. No hernia.  Musculoskeletal:  Normal range of motion. He exhibits tenderness.  Lower lumbar tenderness and left paraspinal tenderness  Neurological: He is alert and oriented to person, place, and time. He has normal strength. No cranial nerve deficit or sensory deficit. Coordination normal. GCS eye subscore is 4. GCS verbal subscore is 5. GCS motor subscore is 6.  Normal sensation in bilateral legs and pain with straight leg raise on the left   Skin: Skin is warm, dry and intact. No rash noted.  No cyanosis.  Psychiatric: He has a normal mood and affect. His speech is normal and behavior is normal. Thought content normal.  Nursing note and vitals reviewed.  ED Treatments / Results  Labs (all labs ordered are listed, but only abnormal results are displayed) Labs Reviewed  URINALYSIS, ROUTINE W REFLEX MICROSCOPIC (NOT AT [facility]) - Abnormal; Notable for the following:       Result Value   Hgb urine dipstick MODERATE (*)    All other components within normal limits  URINE MICROSCOPIC-ADD ON - Abnormal; Notable for the following:    Squamous Epithelial / LPF 0-5 (*)    Bacteria, UA FEW (*)    All other components within normal limits    EKG  EKG Interpretation None       Radiology Dg Lumbar Spine Complete  Result Date: 05/14/2016 CLINICAL DATA:  60 year old male with back pain. EXAM: LUMBAR SPINE - COMPLETE 4+ VIEW COMPARISON:  None. FINDINGS: There is no acute fracture or subluxation of the lumbar spine. There is mild multilevel degenerative changes with minimal loss of vertebral body height. Mild anterior osteophyte at L2-L3. The visualized transverse and spinous processes appear intact. The soft tissues appear unremarkable. A small amorphous calcification over the pelvis likely represent calcification of the prostate gland. IMPRESSION: No acute/traumatic lumbar spine pathology. Electronically Signed   By: Anner Crete M.D.   On: 05/14/2016 05:24    Procedures Procedures (including critical care time)  Medications Ordered in ED Medications  oxyCODONE-acetaminophen (PERCOCET/ROXICET) 5-325 MG per tablet 1 tablet (1 tablet Oral Given 05/13/16 2108)  HYDROmorphone (DILAUDID) injection 1 mg (1 mg Intravenous Given 05/14/16 0123)  ondansetron (ZOFRAN) injection 4 mg (4 mg Intravenous Given 05/14/16 0123)  ketorolac (TORADOL) 30 MG/ML injection 30 mg (30 mg Intravenous Given 05/14/16 0123)  dexamethasone (DECADRON) injection 10 mg (10 mg Intravenous Given 05/14/16 0317)   HYDROmorphone (DILAUDID) injection 2 mg (2 mg Intravenous Given 05/14/16 0318)   DIAGNOSTIC STUDIES:  Oxygen Saturation is 98% on RA, normal by my interpretation.    COORDINATION OF CARE:  12:16 AM Discussed treatment plan with pt at bedside and pt agreed to plan.  Initial Impression / Assessment and Plan / ED Course  I have reviewed the triage vital signs and the nursing notes.  Pertinent labs & imaging results that were available during my care of the patient were reviewed by me and considered in my medical decision making (see chart for details).  Clinical Course  Patient presents with acute onset low back pain with radiation to left hip area. Initially the pain went all the way down his leg and he had some tingling, but that has resolved. Pain is now predominantly in the low back but radiates to the left hip area when he tries to move around in the bed. Patient reports constant pain that is severe and worsens with movement. Pain began after moving heavy objects at work. Cannot rule out herniated disc, but cannot obtain MRI at this point in time. Patient does  not have any neurologic deficit. He has normal strength, sensation and reflexes in lower extremities. No saddle anesthesia. No urinary symptoms. Patient was administered NSAIDs, Decadron, analgesia. His symptoms have significantly improved. Patient was given option to stay in the ER until MRI was available or discharge with analgesia and prompt follow-up with primary care. Patient feels well enough to go home. Will discharge with Percocet, prednisone, Robaxin, Ibuprofen.  I personally performed the services described in this documentation, which was scribed in my presence. The recorded information has been reviewed and is accurate.   Final Clinical Impressions(s) / ED Diagnoses   Final diagnoses:  Lumbar radiculopathy, acute    New Prescriptions New Prescriptions   No medications on file     Orpah Greek,  MD 05/14/16 [phone]

## 2016-05-14 NOTE — ED Notes (Signed)
Family at bedside. 

## 2016-06-02 ENCOUNTER — Encounter: Payer: Self-pay | Admitting: Physician Assistant

## 2016-06-20 ENCOUNTER — Other Ambulatory Visit: Payer: Self-pay | Admitting: Physician Assistant

## 2016-07-26 ENCOUNTER — Other Ambulatory Visit: Payer: Self-pay | Admitting: Physician Assistant

## 2016-08-29 ENCOUNTER — Other Ambulatory Visit: Payer: Self-pay | Admitting: Physician Assistant

## 2016-09-07 ENCOUNTER — Other Ambulatory Visit: Payer: Self-pay | Admitting: Physician Assistant

## 2016-11-20 ENCOUNTER — Other Ambulatory Visit: Payer: Self-pay | Admitting: Physician Assistant

## 2016-12-30 ENCOUNTER — Other Ambulatory Visit: Payer: Self-pay | Admitting: Physician Assistant

## 2017-01-02 ENCOUNTER — Ambulatory Visit (INDEPENDENT_AMBULATORY_CARE_PROVIDER_SITE_OTHER): Payer: Self-pay | Admitting: Physician Assistant

## 2017-01-02 ENCOUNTER — Encounter: Payer: Self-pay | Admitting: Physician Assistant

## 2017-01-02 VITALS — BP 153/98 | HR 60 | Temp 98.7°F | Resp 16 | Ht 66.0 in | Wt 250.8 lb

## 2017-01-02 DIAGNOSIS — Z0289 Encounter for other administrative examinations: Secondary | ICD-10-CM

## 2017-01-02 NOTE — Progress Notes (Signed)
Commercial Driver Medical Examination   Alexander Cochran is a 61 y.o. male with a pertinent medical history of HTN who presents today for a commercial driver fitness determination physical exam. The patient reports no problems today. In the past the patient reports receiving 1 year certificates. He denies focal neurological deficits, vision and hearing changes. He denies the habitual use of benzodiazepines, opioids, amphetamines and denies illicit drug use.   Current medications, family history, allergies, social history reviewed by me and exist elsewhere in the encounter.   Depression screen [facility] 2/9 01/02/2017 12/31/2015 12/31/2014  Decreased Interest 0 0 0  Down, Depressed, Hopeless 0 0 0  PHQ - 2 Score 0 0 0     Review of Systems  Constitutional: Negative for chills, diaphoresis and fever.  Eyes: Negative.   Respiratory: Negative for cough, hemoptysis, sputum production, shortness of breath and wheezing.   Cardiovascular: Negative for chest pain, orthopnea and leg swelling.  Gastrointestinal: Negative for nausea.  Skin: Negative for rash.  Neurological: Negative for dizziness, sensory change, speech change, focal weakness and headaches.    Objective:     Vision/hearing:  Visual Acuity Screening   Right eye Left eye Both eyes  Without correction: 20/20 20/20 20/20   With correction:     Comments: Color vision done, passed  Hearing Screening Comments: Whisper test done at 10 feet, passed  Applicant can recognize and distinguish among traffic control signals and devices showing standard red, green, and amber colors.  Corrective lenses required: No  Monocular Vision?: No  Hearing aid requirement: No  Physical Exam  Constitutional: He is oriented to person, place, and time. He appears well-developed. He is active and cooperative.  Non-toxic appearance.  HENT:  Right Ear: Hearing, tympanic membrane, external ear and ear canal normal.  Left Ear: Hearing, tympanic membrane,  external ear and ear canal normal.  Nose: Nose normal. Right sinus exhibits no maxillary sinus tenderness and no frontal sinus tenderness. Left sinus exhibits no maxillary sinus tenderness and no frontal sinus tenderness.  Mouth/Throat: Uvula is midline, oropharynx is clear and moist and mucous membranes are normal. No oropharyngeal exudate, posterior oropharyngeal edema or tonsillar abscesses.  Eyes: Conjunctivae and EOM are normal. Pupils are equal, round, and reactive to light.  Cardiovascular: Normal rate, regular rhythm, S1 normal, S2 normal, normal heart sounds, intact distal pulses and normal pulses.  Exam reveals no gallop and no friction rub.   No murmur heard. Pulmonary/Chest: Effort normal. No stridor. No tachypnea. No respiratory distress. He has no wheezes. He has no rales.  Abdominal: Soft. Normal appearance and bowel sounds are normal. He exhibits no distension and no mass. There is no tenderness. There is no rigidity, no rebound, no guarding and no CVA tenderness. No hernia.  Musculoskeletal: He exhibits no edema.  Lymphadenopathy:       Head (right side): No submandibular and no tonsillar adenopathy present.       Head (left side): No submandibular and no tonsillar adenopathy present.    He has no cervical adenopathy.  Neurological: He is alert and oriented to person, place, and time. He has normal strength and normal reflexes. He is not disoriented. No cranial nerve deficit or sensory deficit. He exhibits normal muscle tone. Coordination and gait normal.  Skin: Skin is warm and dry. He is not diaphoretic. No pallor.  Psychiatric: His behavior is normal.  Vitals reviewed.   BP (!) 153/98 (BP Location: Left Arm, Patient Position: Sitting, Cuff Size: Large)   Pulse  60   Temp 98.7 F (37.1 C) (Oral)   Resp 16   Ht 5\' 6"  (1.676 m)   Wt 250 lb 12.8 oz (113.8 kg)   SpO2 98%   BMI 40.48 kg/m   Labs: Comments: U/A: specific gravity: 1.020; glucose: Neg; blood: Neg; pro:  Neg  Assessment:    Healthy male exam.  Meets standards, but periodic monitoring required due to Stage 1 HTN.  Driver qualified only for 1 year.    Plan:    Medical examiners certificate completed and printed. Return as needed.    Philis Fendt, MS, PA-C 11:40 AM, 01/02/2017

## 2017-01-02 NOTE — Patient Instructions (Signed)
     IF you received an x-ray today, you will receive an invoice from [facility]. Please contact [facility] at [phone] with questions or concerns regarding your invoice.   IF you received labwork today, you will receive an invoice from [facility]. Please contact [facility] at [phone] with questions or concerns regarding your invoice.   Our billing staff will not be able to assist you with questions regarding bills from these companies.  You will be contacted with the lab results as soon as they are available. The fastest way to get your results is to activate your My Chart account. Instructions are located on the last page of this paperwork. If you have not heard from us regarding the results in 2 weeks, please contact this office.     

## 2017-02-20 ENCOUNTER — Other Ambulatory Visit: Payer: Self-pay | Admitting: Physician Assistant

## 2017-10-26 ENCOUNTER — Encounter: Payer: Self-pay | Admitting: Physician Assistant

## 2017-12-15 ENCOUNTER — Ambulatory Visit: Payer: Self-pay | Admitting: Emergency Medicine

## 2017-12-28 ENCOUNTER — Ambulatory Visit (INDEPENDENT_AMBULATORY_CARE_PROVIDER_SITE_OTHER): Payer: Self-pay | Admitting: Physician Assistant

## 2017-12-28 ENCOUNTER — Encounter: Payer: Self-pay | Admitting: Physician Assistant

## 2017-12-28 ENCOUNTER — Other Ambulatory Visit: Payer: Self-pay

## 2017-12-28 VITALS — BP 190/114 | HR 97 | Temp 98.6°F | Resp 16 | Ht 67.0 in | Wt 256.0 lb

## 2017-12-28 DIAGNOSIS — Z1329 Encounter for screening for other suspected endocrine disorder: Secondary | ICD-10-CM

## 2017-12-28 DIAGNOSIS — I10 Essential (primary) hypertension: Secondary | ICD-10-CM

## 2017-12-28 DIAGNOSIS — E785 Hyperlipidemia, unspecified: Secondary | ICD-10-CM

## 2017-12-28 DIAGNOSIS — Z130 Encounter for screening for diseases of the blood and blood-forming organs and certain disorders involving the immune mechanism: Secondary | ICD-10-CM

## 2017-12-28 DIAGNOSIS — Z6838 Body mass index (BMI) 38.0-38.9, adult: Secondary | ICD-10-CM

## 2017-12-28 DIAGNOSIS — Z0000 Encounter for general adult medical examination without abnormal findings: Secondary | ICD-10-CM

## 2017-12-28 DIAGNOSIS — Z1211 Encounter for screening for malignant neoplasm of colon: Secondary | ICD-10-CM

## 2017-12-28 DIAGNOSIS — Z125 Encounter for screening for malignant neoplasm of prostate: Secondary | ICD-10-CM

## 2017-12-28 DIAGNOSIS — Z23 Encounter for immunization: Secondary | ICD-10-CM

## 2017-12-28 DIAGNOSIS — Z13228 Encounter for screening for other metabolic disorders: Secondary | ICD-10-CM

## 2017-12-28 MED ORDER — ZOSTER VAC RECOMB ADJUVANTED 50 MCG/0.5ML IM SUSR: 0.5000 mL | Freq: Once | INTRAMUSCULAR | 1 refills | Status: AC

## 2017-12-28 MED ORDER — SIMVASTATIN 40 MG PO TABS: 40.0000 mg | ORAL_TABLET | Freq: Every day | ORAL | 3 refills | Status: DC

## 2017-12-28 MED ORDER — LISINOPRIL-HYDROCHLOROTHIAZIDE 20-25 MG PO TABS: 1.0000 | ORAL_TABLET | Freq: Every day | ORAL | 3 refills | Status: DC

## 2017-12-28 NOTE — Progress Notes (Signed)
12/28/2017 9:45 AM   DOB: 1955/08/03 / MRN: [id number]  SUBJECTIVE:  Alexander Cochran is a 62 y.o. male presenting for annual exam.  Would like refills of BP and statin medication as well.   The natural history of prostate cancer and ongoing controversy regarding screening and potential treatment outcomes of prostate cancer has been discussed with the patient. The meaning of a false positive PSA and a false negative PSA has been discussed. He indicates understanding of the limitations of this screening test and wishes to proceed with screening PSA testing.  Would like refills of his HTN and dyslipidemia medications.  Has been out of both for about a year due to job loss and economic hardship.  BP elevated today however he denies HA, vision change, chest pain, SOB, DOE, orthopnea, leg swelling.   There is no immunization history on file for this patient.   He has No Known Allergies.   He  has a past medical history of Hyperlipidemia and Hypertension.    He  reports that he has quit smoking. He has never used smokeless tobacco. He reports that he drinks alcohol. He reports that he does not use drugs. He  reports that he does not engage in sexual activity. The patient  has no past surgical history on file.  His family history includes Hypertension in his brother and sister.  Review of Systems  Constitutional: Negative for chills, diaphoresis and fever.  Eyes: Negative.   Respiratory: Negative for cough, hemoptysis, sputum production, shortness of breath and wheezing.   Cardiovascular: Negative for chest pain, orthopnea and leg swelling.  Gastrointestinal: Negative for abdominal pain, blood in stool, constipation, diarrhea, heartburn, melena, nausea and vomiting.  Genitourinary: Negative for dysuria, flank pain, frequency, hematuria and urgency.  Skin: Negative for rash.  Neurological: Negative for dizziness, sensory change, speech change, focal weakness and headaches.    The problem list  and medications were reviewed and updated by myself where necessary and exist elsewhere in the encounter.   OBJECTIVE:  BP (!) 190/114   Pulse 97   Temp 98.6 F (37 C)   Resp 16   Ht 5\' 7"  (1.702 m)   Wt 256 lb (116.1 kg)   SpO2 97%   BMI 40.10 kg/m   BP Readings from Last 3 Encounters:  12/28/17 (!) 190/114  01/02/17 (!) 153/98  05/14/16 150/77   Physical Exam  Constitutional: He is oriented to person, place, and time. He appears well-developed. He is active.  Non-toxic appearance. He does not appear ill.  Eyes: Pupils are equal, round, and reactive to light. Conjunctivae and EOM are normal.  Cardiovascular: Normal rate, regular rhythm, S1 normal, S2 normal, normal heart sounds, intact distal pulses and normal pulses. Exam reveals no gallop and no friction rub.  No murmur heard. Pulmonary/Chest: Effort normal. No stridor. No respiratory distress. He has no wheezes. He has no rales.  Abdominal: Soft. Normal appearance and bowel sounds are normal. He exhibits no distension and no mass. There is no tenderness. There is no rigidity, no rebound, no guarding and no CVA tenderness. No hernia.  Musculoskeletal: Normal range of motion. He exhibits no edema.  Neurological: He is alert and oriented to person, place, and time. He has normal strength and normal reflexes. He is not disoriented. No cranial nerve deficit or sensory deficit. He exhibits normal muscle tone. Coordination and gait normal.  Skin: Skin is warm and dry. He is not diaphoretic. No pallor.  Psychiatric: He has a  normal mood and affect. His behavior is normal.  Nursing note and vitals reviewed.   Lab Results  Component Value Date   WBC 4.0 12/31/2014   HGB 11.9 (L) 12/31/2014   HCT 36.3 (L) 12/31/2014   MCV 89.0 12/31/2014   PLT 156 12/31/2014    Lab Results  Component Value Date   CREATININE 1.32 12/31/2015   BUN 11 12/31/2015   NA 144 12/31/2015   K 3.9 12/31/2015   CL 108 12/31/2015   CO2 26 12/31/2015     Lab Results  Component Value Date   ALT 21 12/31/2015   AST 20 12/31/2015   ALKPHOS 58 12/31/2015   BILITOT 0.6 12/31/2015    Lab Results  Component Value Date   TSH 0.955 08/31/2013    Lab Results  Component Value Date   HGBA1C 5.7 12/31/2014    Lab Results  Component Value Date   CHOL 142 12/31/2015   HDL 38 (L) 12/31/2015   LDLCALC 73 12/31/2015   TRIG 155 (H) 12/31/2015   CHOLHDL 3.7 12/31/2015    Lab Results  Component Value Date   PSA 0.72 08/31/2013   PSA 0.93 10/21/2011     No results found for this or any previous visit (from the past 72 hour(s)).  No results found.  ASSESSMENT AND PLAN:  Kimble was seen today for annual exam and medication refill.  Diagnoses and all orders for this visit:  Annual physical exam  Screening for endocrine, metabolic and immunity disorder -     Hemoglobin A1c -     Basic metabolic panel -     Hepatic function panel -     TSH  Special screening for malignant neoplasms, colon -     Cologuard  Screening PSA (prostate specific antigen) -     PSA  Essential hypertension: Asymptomatic today. Refilling medication with increased dose of HCTZ as pressure has never been at target.  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  -     CBC -     lisinopril-hydrochlorothiazide (PRINZIDE,ZESTORETIC) 20-25 MG tablet; Take 1 tablet by mouth daily.  Hyperlipidemia, unspecified hyperlipidemia type -     Lipid panel -     simvastatin (ZOCOR) 40 MG tablet; Take 1 tablet (40 mg total) by mouth daily.  BMI 38.0-38.9,adult  Need shingles vaccination:  -     Zoster Vaccine Adjuvanted [facility]) injection; Inject 0.5 mLs into the muscle once for 1 dose. Due for booster in 6 months.    The patient is advised to call or return to clinic if he does not see an improvement in symptoms, or to seek the care of the closest emergency department if he worsens with the above plan.   Philis Fendt, MHS, PA-C [facility] 12/28/2017 9:45 AM

## 2017-12-28 NOTE — Patient Instructions (Addendum)
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  Start back on your BP meds today.     IF you received an x-ray today, you will receive an invoice from [facility]. Please contact [facility] at [phone] with questions or concerns regarding your invoice.   IF you received labwork today, you will receive an invoice from [facility]. Please contact [facility] at [phone] with questions or concerns regarding your invoice.   Our billing staff will not be able to assist you with questions regarding bills from these companies.  You will be contacted with the lab results as soon as they are available. The fastest way to get your results is to activate your My Chart account. Instructions are located on the last page of this paperwork. If you have not heard from [place] regarding the results in 2 weeks, please contact this office.

## 2017-12-29 LAB — BASIC METABOLIC PANEL
BUN / CREAT RATIO: 10 (ref 10–24)
BUN: 13 mg/dL (ref 8–27)
CHLORIDE: 107 mmol/L — AB (ref 96–106)
CO2: 22 mmol/L (ref 20–29)
Calcium: 9.2 mg/dL (ref 8.6–10.2)
Creatinine, Ser: 1.29 mg/dL — ABNORMAL HIGH (ref 0.76–1.27)
GFR calc Af Amer: 69 mL/min/{1.73_m2} (ref >59)
GFR calc non Af Amer: 60 mL/min/{1.73_m2} (ref >59)
Glucose: 100 mg/dL — ABNORMAL HIGH (ref 65–99)
Potassium: 3.9 mmol/L (ref 3.5–5.2)
SODIUM: 146 mmol/L — AB (ref 134–144)

## 2017-12-29 LAB — HEMOGLOBIN A1C
ESTIMATED AVERAGE GLUCOSE: 94 mg/dL
Hgb A1c MFr Bld: 4.9 % (ref 4.8–5.6)

## 2017-12-29 LAB — HEPATIC FUNCTION PANEL
ALK PHOS: 85 IU/L (ref 39–117)
ALT: 16 IU/L (ref 0–44)
AST: 15 IU/L (ref 0–40)
Albumin: 4.9 g/dL — ABNORMAL HIGH (ref 3.6–4.8)
BILIRUBIN, DIRECT: 0.17 mg/dL (ref 0.00–0.40)
Bilirubin Total: 0.6 mg/dL (ref 0.0–1.2)
Total Protein: 7.1 g/dL (ref 6.0–8.5)

## 2017-12-29 LAB — PSA: PROSTATE SPECIFIC AG, SERUM: 0.8 ng/mL (ref 0.0–4.0)

## 2017-12-29 NOTE — Progress Notes (Signed)
Please make patient aware of results via letter. In the context of his overall presentation any abnormal values are of no clinical significance.  Philis Fendt PA-C, 12/29/2017 7:19 PM

## 2017-12-30 ENCOUNTER — Encounter: Payer: Self-pay | Admitting: *Deleted

## 2018-01-01 ENCOUNTER — Other Ambulatory Visit: Payer: Self-pay

## 2018-01-01 ENCOUNTER — Ambulatory Visit (INDEPENDENT_AMBULATORY_CARE_PROVIDER_SITE_OTHER): Payer: Self-pay | Admitting: Physician Assistant

## 2018-01-01 ENCOUNTER — Encounter: Payer: Self-pay | Admitting: Physician Assistant

## 2018-01-01 VITALS — BP 150/110 | HR 93 | Temp 98.4°F | Resp 16 | Ht 67.0 in | Wt 251.0 lb

## 2018-01-01 DIAGNOSIS — I10 Essential (primary) hypertension: Secondary | ICD-10-CM

## 2018-01-01 DIAGNOSIS — E785 Hyperlipidemia, unspecified: Secondary | ICD-10-CM

## 2018-01-01 MED ORDER — AMLODIPINE BESYLATE 10 MG PO TABS: 5.0000 mg | ORAL_TABLET | Freq: Every day | ORAL | 3 refills | Status: DC

## 2018-01-01 MED ORDER — SIMVASTATIN 40 MG PO TABS: 20.0000 mg | ORAL_TABLET | Freq: Every day | ORAL | 3 refills | Status: DC

## 2018-01-01 NOTE — Progress Notes (Signed)
    01/01/2018 9:04 AM   DOB: April 26, 1956 / MRN: [id number]  SUBJECTIVE:  Alexander Cochran is a 62 y.o. male presenting for BP recheck.  He would like to had a DOT today however blood pressure was just too high.  He needs add on therapy.   Review of Systems  Constitutional: Negative for chills, diaphoresis and fever.  Gastrointestinal: Negative for nausea.  Skin: Negative for rash.  Neurological: Negative for dizziness.    The problem list and medications were reviewed and updated by myself where necessary and exist elsewhere in the encounter.   OBJECTIVE:  BP (!) 150/110 (BP Location: Left Arm, Cuff Size: Large)   Pulse 93   Temp 98.4 F (36.9 C) (Oral)   Resp 16   Ht 5\' 7"  (1.702 m)   Wt 251 lb (113.9 kg)   SpO2 95%   BMI 39.31 kg/m   Physical Exam  Constitutional: He is oriented to person, place, and time. He appears well-developed. He does not appear ill.  Eyes: Pupils are equal, round, and reactive to light. Conjunctivae and EOM are normal.  Cardiovascular: Normal rate.  Pulmonary/Chest: Effort normal.  Abdominal: He exhibits no distension.  Musculoskeletal: Normal range of motion.  Neurological: He is alert and oriented to person, place, and time. No cranial nerve deficit. Coordination normal.  Skin: Skin is warm and dry. He is not diaphoretic.  Psychiatric: He has a normal mood and affect.  Nursing note and vitals reviewed.   No results found for this or any previous visit (from the past 72 hour(s)).  No results found.  ASSESSMENT AND PLAN:  Almin was seen today for hypertension.  Diagnoses and all orders for this visit:  Asymptomatic hypertension -     amLODipine (NORVASC) 10 MG tablet; Take 0.5-1 tablets (5-10 mg total) by mouth daily.  Hyperlipidemia, unspecified hyperlipidemia type -     simvastatin (ZOCOR) 40 MG tablet; Take 0.5 tablets (20 mg total) by mouth daily.    The patient is advised to call or return to clinic if he does not see an  improvement in symptoms, or to seek the care of the closest emergency department if he worsens with the above plan.   Philis Fendt, MHS, PA-C [facility] 01/01/2018 9:04 AM

## 2018-01-01 NOTE — Patient Instructions (Addendum)
  Keep an eye on the blood pressure this week.  I would suggest that you check it daily.  If your numbers are going below 120/80 then go to half tab of Norvasc.  Hopefully you can get some local runs this coming week.  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  Please start taking only half of your simvastatin as there is an interaction between Norvasc and simvastatin.  I have changed the prescription to reflect the 20 mg dosage of simvastatin.   IF you received an x-ray today, you will receive an invoice from [facility]. Please contact [facility] at [phone] with questions or concerns regarding your invoice.   IF you received labwork today, you will receive an invoice from [facility]. Please contact [facility] at [phone] with questions or concerns regarding your invoice.   Our billing staff will not be able to assist you with questions regarding bills from these companies.  You will be contacted with the lab results as soon as they are available. The fastest way to get your results is to activate your My Chart account. Instructions are located on the last page of this paperwork. If you have not heard from [place] regarding the results in 2 weeks, please contact this office.

## 2018-01-09 ENCOUNTER — Ambulatory Visit: Payer: Self-pay | Admitting: Physician Assistant

## 2018-01-09 ENCOUNTER — Encounter: Payer: Self-pay | Admitting: Physician Assistant

## 2018-01-09 ENCOUNTER — Other Ambulatory Visit: Payer: Self-pay

## 2018-01-09 DIAGNOSIS — I10 Essential (primary) hypertension: Secondary | ICD-10-CM

## 2018-01-09 MED ORDER — AMLODIPINE BESYLATE 10 MG PO TABS: 10.0000 mg | ORAL_TABLET | Freq: Every day | ORAL | 3 refills | Status: DC

## 2018-01-09 NOTE — Patient Instructions (Signed)
     IF you received an x-ray today, you will receive an invoice from [facility]. Please contact [facility] at [phone] with questions or concerns regarding your invoice.   IF you received labwork today, you will receive an invoice from [facility]. Please contact [facility] at [phone] with questions or concerns regarding your invoice.   Our billing staff will not be able to assist you with questions regarding bills from these companies.  You will be contacted with the lab results as soon as they are available. The fastest way to get your results is to activate your My Chart account. Instructions are located on the last page of this paperwork. If you have not heard from us regarding the results in 2 weeks, please contact this office.     

## 2018-01-09 NOTE — Progress Notes (Deleted)
    01/09/2018 9:20 AM   DOB: Dec 13, 1955 / MRN: [id number]  SUBJECTIVE:  Alexander Cochran is a 62 y.o. male presenting for   He has No Known Allergies.   He  has a past medical history of Hyperlipidemia and Hypertension.    He  reports that he quit smoking about 44 years ago. He has a 0.50 pack-year smoking history. He has never used smokeless tobacco. He reports that he drinks alcohol. He reports that he does not use drugs. He  reports that he does not engage in sexual activity. The patient  has a past surgical history that includes Cyst excision perineal (1984).  His family history includes Hypertension in his brother and sister.  ROS  The problem list and medications were reviewed and updated by myself where necessary and exist elsewhere in the encounter.   OBJECTIVE:  BP (!) 158/88   Pulse 90   Temp 97.8 F (36.6 C) (Oral)   Resp 16   Ht 5' 6.93" (1.7 m)   Wt 252 lb (114.3 kg)   SpO2 98%   BMI 39.55 kg/m   Physical Exam  No results found for this or any previous visit (from the past 72 hour(s)).  No results found.  ASSESSMENT AND PLAN:  There are no diagnoses linked to this encounter.  The patient is advised to call or return to clinic if he does not see an improvement in symptoms, or to seek the care of the closest emergency department if he worsens with the above plan.   Philis Fendt, MHS, PA-C [facility] 01/09/2018 9:20 AM

## 2018-01-09 NOTE — Progress Notes (Signed)
   Commercial Driver Medical Examination   Alexander Cochran is a 62 y.o. 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 The patient reports no problems today. In the past the patient reports receiving 1 year certificates. He denies focal neurological deficits, vision and hearing changes. He denies the habitual use of benzodiazepines, opioids, amphetamines and denies illicit drug use.   Current medications, family history, allergies, social history reviewed by me and exist elsewhere in the encounter.   Review of Systems  Constitutional: Negative for chills, diaphoresis and fever.  Eyes: Negative.   Respiratory: Negative for cough, hemoptysis, sputum production, shortness of breath and wheezing.   Cardiovascular: Negative for chest pain, orthopnea and leg swelling.  Gastrointestinal: Negative for abdominal pain, blood in stool, constipation, diarrhea, heartburn, melena, nausea and vomiting.  Genitourinary: Negative for dysuria, flank pain, frequency, hematuria and urgency.  Skin: Negative for rash.  Neurological: Negative for dizziness, sensory change, speech change, focal weakness and headaches.    Objective:     Vision/hearing: No exam data present  Applicant can recognize and distinguish among traffic control signals and devices showing standard red, green, and amber colors.  Corrective lenses required: No  Monocular Vision?: No  Hearing aid requirement: No  Physical Exam  Constitutional: He is oriented to person, place, and time. He appears well-developed. He is active and cooperative.  Non-toxic appearance. He does not appear ill.  Eyes: Pupils are equal, round, and reactive to light. Conjunctivae and EOM are normal.  Cardiovascular: Normal rate, regular rhythm, S1 normal, S2 normal, normal heart sounds, intact distal pulses and normal pulses. Exam reveals no gallop and no friction rub.   No murmur heard. Pulmonary/Chest: Effort normal. No stridor. No tachypnea. No respiratory distress. He has no wheezes. He has no rales.  Abdominal: Soft. Normal appearance and bowel sounds are normal. He exhibits no distension and no mass. There is no tenderness. There is no rigidity, no rebound, no guarding and no CVA tenderness. No hernia.  Musculoskeletal: Normal range of motion. He exhibits no edema.  Neurological: He is alert and oriented to person, place, and time. He has normal strength and normal reflexes. He is not disoriented. No cranial nerve deficit or sensory deficit. He exhibits normal muscle tone. Coordination and gait normal.  Skin: Skin is warm and dry. He is not diaphoretic. No pallor.  Psychiatric: He has a normal mood and affect. His behavior is normal.  Nursing note and vitals reviewed.   BP (!) 158/88   Pulse 90   Temp 97.8 F (36.6 C) (Oral)   Resp 16   Ht 5' 6.93" (1.7 m)   Wt 252 lb (114.3 kg)   SpO2 98%   BMI 39.55 kg/m   Labs: Comments: Glucose: Neg Protein: Neg Blood: Neg SQ: 1.020  Assessment:    Healthy male exam.  Meets standards, but periodic monitoring required due to HTN.  Driver qualified only for 1 year.  He will follow up with me in six months for HTN recheck. His is now up to 10 mg norvasc daily along with ACE/diuretic.     Plan:    Medical examiners certificate completed and printed. Return as needed.    Alexander Fendt, MS, PA-C 9:58 AM, 01/09/2018

## 2018-02-24 ENCOUNTER — Telehealth: Payer: Self-pay | Admitting: Physician Assistant

## 2018-05-20 NOTE — Telephone Encounter (Signed)
DONE

## 2018-07-12 ENCOUNTER — Ambulatory Visit: Payer: Self-pay | Admitting: Physician Assistant

## 2018-08-28 DIAGNOSIS — N179 Acute kidney failure, unspecified: Secondary | ICD-10-CM | POA: Insufficient documentation

## 2018-08-28 DIAGNOSIS — R579 Shock, unspecified: Secondary | ICD-10-CM | POA: Insufficient documentation

## 2018-08-28 DIAGNOSIS — N189 Chronic kidney disease, unspecified: Secondary | ICD-10-CM | POA: Insufficient documentation

## 2018-08-28 DIAGNOSIS — K859 Acute pancreatitis without necrosis or infection, unspecified: Secondary | ICD-10-CM | POA: Insufficient documentation

## 2018-08-29 ENCOUNTER — Inpatient Hospital Stay
Admission: AD | Admit: 2018-08-29 | Payer: Self-pay | Source: Other Acute Inpatient Hospital | Admitting: Pulmonary Disease

## 2018-08-30 DIAGNOSIS — E1101 Type 2 diabetes mellitus with hyperosmolarity with coma: Secondary | ICD-10-CM | POA: Insufficient documentation

## 2018-08-30 DIAGNOSIS — I21A1 Myocardial infarction type 2: Secondary | ICD-10-CM | POA: Insufficient documentation

## 2018-08-30 DIAGNOSIS — R221 Localized swelling, mass and lump, neck: Secondary | ICD-10-CM | POA: Insufficient documentation

## 2018-08-30 DIAGNOSIS — N2889 Other specified disorders of kidney and ureter: Secondary | ICD-10-CM | POA: Insufficient documentation

## 2018-09-14 ENCOUNTER — Ambulatory Visit (INDEPENDENT_AMBULATORY_CARE_PROVIDER_SITE_OTHER): Payer: Self-pay | Admitting: Emergency Medicine

## 2018-09-14 ENCOUNTER — Other Ambulatory Visit: Payer: Self-pay

## 2018-09-14 ENCOUNTER — Encounter: Payer: Self-pay | Admitting: Emergency Medicine

## 2018-09-14 VITALS — BP 113/68 | HR 101 | Temp 98.4°F | Resp 16 | Ht 67.25 in | Wt 234.0 lb

## 2018-09-14 DIAGNOSIS — N2889 Other specified disorders of kidney and ureter: Secondary | ICD-10-CM

## 2018-09-14 DIAGNOSIS — E1165 Type 2 diabetes mellitus with hyperglycemia: Secondary | ICD-10-CM

## 2018-09-14 DIAGNOSIS — Z1211 Encounter for screening for malignant neoplasm of colon: Secondary | ICD-10-CM

## 2018-09-14 MED ORDER — FREESTYLE LIBRE READER DEVI: 1.0000 [IU] | Freq: Every day | 5 refills | Status: AC

## 2018-09-14 MED ORDER — FREESTYLE LIBRE SENSOR SYSTEM MISC: 5 refills | Status: DC

## 2018-09-14 NOTE — Patient Instructions (Addendum)
   If you have lab work done today you will be contacted with your lab results within the next 2 weeks.  If you have not heard from us then please contact us. The fastest way to get your results is to register for My Chart.   IF you received an x-ray today, you will receive an invoice from [facility]. Please contact [facility] at [phone] with questions or concerns regarding your invoice.   IF you received labwork today, you will receive an invoice from [facility]. Please contact [facility] at [phone] with questions or concerns regarding your invoice.   Our billing staff will not be able to assist you with questions regarding bills from these companies.  You will be contacted with the lab results as soon as they are available. The fastest way to get your results is to activate your My Chart account. Instructions are located on the last page of this paperwork. If you have not heard from us regarding the results in 2 weeks, please contact this office.     Diabetes Mellitus and Nutrition, Adult When you have diabetes (diabetes mellitus), it is very important to have healthy eating habits because your blood sugar (glucose) levels are greatly affected by what you eat and drink. Eating healthy foods in the appropriate amounts, at about the same times every day, can help you:  Control your blood glucose.  Lower your risk of heart disease.  Improve your blood pressure.  Reach or maintain a healthy weight. Every person with diabetes is different, and each person has different needs for a meal plan. Your health care provider may recommend that you work with a diet and nutrition specialist (dietitian) to make a meal plan that is best for you. Your meal plan may vary depending on factors such as:  The calories you need.  The medicines you take.  Your weight.  Your blood glucose, blood pressure, and cholesterol levels.  Your activity level.  Other health conditions  you have, such as heart or kidney disease. How do carbohydrates affect me? Carbohydrates, also called carbs, affect your blood glucose level more than any other type of food. Eating carbs naturally raises the amount of glucose in your blood. Carb counting is a method for keeping track of how many carbs you eat. Counting carbs is important to keep your blood glucose at a healthy level, especially if you use insulin or take certain oral diabetes medicines. It is important to know how many carbs you can safely have in each meal. This is different for every person. Your dietitian can help you calculate how many carbs you should have at each meal and for each snack. Foods that contain carbs include:  Bread, cereal, rice, pasta, and crackers.  Potatoes and corn.  Peas, beans, and lentils.  Milk and yogurt.  Fruit and juice.  Desserts, such as cakes, cookies, ice cream, and candy. How does alcohol affect me? Alcohol can cause a sudden decrease in blood glucose (hypoglycemia), especially if you use insulin or take certain oral diabetes medicines. Hypoglycemia can be a life-threatening condition. Symptoms of hypoglycemia (sleepiness, dizziness, and confusion) are similar to symptoms of having too much alcohol. If your health care provider says that alcohol is safe for you, follow these guidelines:  Limit alcohol intake to no more than 1 drink per day for nonpregnant women and 2 drinks per day for men. One drink equals 12 oz of beer, 5 oz of wine, or 1 oz of hard liquor.    Do not drink on an empty stomach.  Keep yourself hydrated with water, diet soda, or unsweetened iced tea.  Keep in mind that regular soda, juice, and other mixers may contain a lot of sugar and must be counted as carbs. What are tips for following this plan?  Reading food labels  Start by checking the serving size on the "Nutrition Facts" label of packaged foods and drinks. The amount of calories, carbs, fats, and other  nutrients listed on the label is based on one serving of the item. Many items contain more than one serving per package.  Check the total grams (g) of carbs in one serving. You can calculate the number of servings of carbs in one serving by dividing the total carbs by 15. For example, if a food has 30 g of total carbs, it would be equal to 2 servings of carbs.  Check the number of grams (g) of saturated and trans fats in one serving. Choose foods that have low or no amount of these fats.  Check the number of milligrams (mg) of salt (sodium) in one serving. Most people should limit total sodium intake to less than 2,300 mg per day.  Always check the nutrition information of foods labeled as "low-fat" or "nonfat". These foods may be higher in added sugar or refined carbs and should be avoided.  Talk to your dietitian to identify your daily goals for nutrients listed on the label. Shopping  Avoid buying canned, premade, or processed foods. These foods tend to be high in fat, sodium, and added sugar.  Shop around the outside edge of the grocery store. This includes fresh fruits and vegetables, bulk grains, fresh meats, and fresh dairy. Cooking  Use low-heat cooking methods, such as baking, instead of high-heat cooking methods like deep frying.  Cook using healthy oils, such as olive, canola, or sunflower oil.  Avoid cooking with butter, cream, or high-fat meats. Meal planning  Eat meals and snacks regularly, preferably at the same times every day. Avoid going long periods of time without eating.  Eat foods high in fiber, such as fresh fruits, vegetables, beans, and whole grains. Talk to your dietitian about how many servings of carbs you can eat at each meal.  Eat 4-6 ounces (oz) of lean protein each day, such as lean meat, chicken, fish, eggs, or tofu. One oz of lean protein is equal to: ? 1 oz of meat, chicken, or fish. ? 1 egg. ?  cup of tofu.  Eat some foods each day that contain  healthy fats, such as avocado, nuts, seeds, and fish. Lifestyle  Check your blood glucose regularly.  Exercise regularly as told by your health care provider. This may include: ? 150 minutes of moderate-intensity or vigorous-intensity exercise each week. This could be brisk walking, biking, or water aerobics. ? Stretching and doing strength exercises, such as yoga or weightlifting, at least 2 times a week.  Take medicines as told by your health care provider.  Do not use any products that contain nicotine or tobacco, such as cigarettes and e-cigarettes. If you need help quitting, ask your health care provider.  Work with a counselor or diabetes educator to identify strategies to manage stress and any emotional and social challenges. Questions to ask a health care provider  Do I need to meet with a diabetes educator?  Do I need to meet with a dietitian?  What number can I call if I have questions?  When are the best times to   check my blood glucose? Where to find more information:  American Diabetes Association: diabetes.org  Academy of Nutrition and Dietetics: www.eatright.org  National Institute of Diabetes and Digestive and Kidney Diseases (NIH): www.niddk.nih.gov Summary  A healthy meal plan will help you control your blood glucose and maintain a healthy lifestyle.  Working with a diet and nutrition specialist (dietitian) can help you make a meal plan that is best for you.  Keep in mind that carbohydrates (carbs) and alcohol have immediate effects on your blood glucose levels. It is important to count carbs and to use alcohol carefully. This information is not intended to replace advice given to you by your health care provider. Make sure you discuss any questions you have with your health care provider. Document Released: 04/10/2005 Document Revised: 02/11/2017 Document Reviewed: 08/18/2016 Elsevier Interactive Patient Education  2019 Elsevier Inc.  

## 2018-09-14 NOTE — Progress Notes (Signed)
Lab Results  Component Value Date   HGBA1C 4.9 12/28/2017   Alexander Cochran 63 y.o.   Chief Complaint  Patient presents with  . Establish Care  . Diabetes    follow up after IP at [facility] 08/29/2018    HISTORY OF PRESENT ILLNESS: This is a 63 y.o. male discharged from the hospital at [facility] last week after he was admitted with diabetes and ketoacidosis, acute pancreatitis, kidney failure, shock, anemia with melena.  Work-up also showed a left renal mass.  Discharge report reviewed with patient and son-in-law.  Here to establish care with me.  Doing well.  Has no complaints or medical concerns at this point  HPI   Prior to Admission medications   Medication Sig Start Date End Date Taking? Authorizing Provider  insulin glargine (LANTUS) 100 UNIT/ML injection Inject 45 Units into the skin daily. In the evening.   Yes [provider]  pantoprazole (PROTONIX) 40 MG tablet Take 40 mg by mouth daily.   Yes [provider]  lisinopril-hydrochlorothiazide (PRINZIDE,ZESTORETIC) 20-25 MG tablet Take 1 tablet by mouth daily. 12/28/17   Tereasa Coop, PA-C  simvastatin (ZOCOR) 40 MG tablet Take 0.5 tablets (20 mg total) by mouth daily. 01/01/18   Tereasa Coop, PA-C    No Known Allergies  Patient Active Problem List   Diagnosis Date Noted  . BMI 38.0-38.9,adult 10/21/2011  . 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 Hyperlipemia 10/21/2011  . HTN (hypertension) 10/21/2011    Past Medical History:  Diagnosis Date  . Hyperlipidemia   . Hypertension     Past Surgical History:  Procedure Laterality Date  . CYST EXCISION PERINEAL  1984    Social History   Socioeconomic History  . Marital status: Married    Spouse name: Not on file  . Number of children: Not on file  . Years of education: Not on file  . Highest education level: Not on file  Occupational History  . Not on file  Social Needs  . Financial resource strain: Not on file  . Food  insecurity:    Worry: Not on file    Inability: Not on file  . Transportation needs:    Medical: Not on file    Non-medical: Not on file  Tobacco Use  . Smoking status: Former Smoker    Packs/day: 0.25    Years: 2.00    Pack years: 0.50    Last attempt to quit: 12/28/1973    Years since quitting: 44.7  . Smokeless tobacco: Never Used  Substance and Sexual Activity  . Alcohol use: Yes  . Drug use: No  . Sexual activity: Never  Lifestyle  . Physical activity:    Days per week: Not on file    Minutes per session: Not on file  . Stress: Not on file  Relationships  . Social connections:    Talks on phone: Not on file    Gets together: Not on file    Attends religious service: Not on file    Active member of club or organization: Not on file    Attends meetings of clubs or organizations: Not on file    Relationship status: Not on file  . Intimate partner violence:    Fear of current or ex partner: Not on file    Emotionally abused: Not on file    Physically abused: Not on file    Forced sexual activity: Not on file  Other Topics Concern  .  Not on file  Social History Narrative  . Not on file    Family History  Problem Relation Age of Onset  . Hypertension Sister   . Hypertension Brother      Review of Systems  Constitutional: Negative.  Negative for chills and fever.  HENT: Negative.   Eyes: Positive for blurred vision. Negative for double vision.  Respiratory: Negative.  Negative for cough and shortness of breath.   Cardiovascular: Negative.  Negative for chest pain and palpitations.  Gastrointestinal: Negative for abdominal pain, nausea and vomiting.  Genitourinary: Negative.  Negative for dysuria.  Musculoskeletal: Negative for back pain, myalgias and neck pain.  Skin: Negative.  Negative for rash.  Neurological: Negative.  Negative for dizziness and headaches.  Endo/Heme/Allergies: Negative.   All other systems reviewed and are negative.  Vitals:   09/14/18  1150  BP: 113/68  Pulse: (!) 101  Resp: 16  Temp: 98.4 F (36.9 C)  SpO2: 96%     Physical Exam Vitals signs reviewed.  Constitutional:      Appearance: Normal appearance.  HENT:     Head: Normocephalic and atraumatic.     Nose: Nose normal.     Mouth/Throat:     Mouth: Mucous membranes are moist.     Pharynx: Oropharynx is clear.  Eyes:     Extraocular Movements: Extraocular movements intact.     Conjunctiva/sclera: Conjunctivae normal.     Pupils: Pupils are equal, round, and reactive to light.  Neck:     Musculoskeletal: Normal range of motion and neck supple.  Cardiovascular:     Rate and Rhythm: Normal rate and regular rhythm.     Heart sounds: Normal heart sounds.  Pulmonary:     Effort: Pulmonary effort is normal.     Breath sounds: Normal breath sounds.  Abdominal:     General: Bowel sounds are normal. There is no distension.     Palpations: Abdomen is soft.     Tenderness: There is no abdominal tenderness.  Musculoskeletal: Normal range of motion.  Skin:    General: Skin is warm and dry.     Capillary Refill: Capillary refill takes less than 2 seconds.  Neurological:     General: No focal deficit present.     Mental Status: He is alert and oriented to person, place, and time.  Psychiatric:        Mood and Affect: Mood normal.        Behavior: Behavior normal.      ASSESSMENT & PLAN: Alexander Cochran was seen today for establish care and diabetes.  Diagnoses and all orders for this visit:  Type 2 diabetes mellitus with hyperglycemia, unspecified whether long term insulin use ([place]) -     CBC with Differential/Platelet -     Comprehensive metabolic panel -     Hemoglobin A1c -     Ambulatory referral to Endocrinology -     Continuous Blood Gluc Sensor ([place]) MISC; Sig as indicated -     Continuous Blood Gluc Receiver (FREESTYLE LIBRE READER) DEVI; 1 Units by Does not apply route daily for 30 days.  Colon cancer screening -      Ambulatory referral to Gastroenterology  Renal mass, left -     Ambulatory referral to Urology    Patient Instructions       If you have lab work done today you will be contacted with your lab results within the next 2 weeks.  If you have not  heard from [place] then please contact us. The fastest way to get your results is to register for My Chart.   IF you received an x-ray today, you will receive an invoice from [facility]. Please contact [facility] at [phone] with questions or concerns regarding your invoice.   IF you received labwork today, you will receive an invoice from [facility]. Please contact [facility] at [phone] with questions or concerns regarding your invoice.   Our billing staff will not be able to assist you with questions regarding bills from these companies.  You will be contacted with the lab results as soon as they are available. The fastest way to get your results is to activate your My Chart account. Instructions are located on the last page of this paperwork. If you have not heard from [place] regarding the results in 2 weeks, please contact this office.     Diabetes Mellitus and Nutrition, Adult When you have diabetes (diabetes mellitus), it is very important to have healthy eating habits because your blood sugar (glucose) levels are greatly affected by what you eat and drink. Eating healthy foods in the appropriate amounts, at about the same times every day, can help you:  Control your blood glucose.  Lower your risk of heart disease.  Improve your blood pressure.  Reach or maintain a healthy weight. Every person with diabetes is different, and each person has different needs for a meal plan. Your health care provider may recommend that you work with a diet and nutrition specialist (dietitian) to make a meal plan that is best for you. Your meal plan may vary depending on factors such as:  The calories you need.  The medicines you  take.  Your weight.  Your blood glucose, blood pressure, and cholesterol levels.  Your activity level.  Other health conditions you have, such as heart or kidney disease. How do carbohydrates affect me? Carbohydrates, also called carbs, affect your blood glucose level more than any other type of food. Eating carbs naturally raises the amount of glucose in your blood. Carb counting is a method for keeping track of how many carbs you eat. Counting carbs is important to keep your blood glucose at a healthy level, especially if you use insulin or take certain oral diabetes medicines. It is important to know how many carbs you can safely have in each meal. This is different for every person. Your dietitian can help you calculate how many carbs you should have at each meal and for each snack. Foods that contain carbs include:  Bread, cereal, rice, pasta, and crackers.  Potatoes and corn.  Peas, beans, and lentils.  Milk and yogurt.  Fruit and juice.  Desserts, such as cakes, cookies, ice cream, and candy. How does alcohol affect me? Alcohol can cause a sudden decrease in blood glucose (hypoglycemia), especially if you use insulin or take certain oral diabetes medicines. Hypoglycemia can be a life-threatening condition. Symptoms of hypoglycemia (sleepiness, dizziness, and confusion) are similar to symptoms of having too much alcohol. If your health care provider says that alcohol is safe for you, follow these guidelines:  Limit alcohol intake to no more than 1 drink per day for nonpregnant women and 2 drinks per day for men. One drink equals 12 oz of beer, 5 oz of wine, or 1 oz of hard liquor.  Do not drink on an empty stomach.  Keep yourself hydrated with water, diet soda, or unsweetened iced tea.  Keep in mind that regular soda, juice,  and other mixers may contain a lot of sugar and must be counted as carbs. What are tips for following this plan?  Reading food labels  Start by  checking the serving size on the "Nutrition Facts" label of packaged foods and drinks. The amount of calories, carbs, fats, and other nutrients listed on the label is based on one serving of the item. Many items contain more than one serving per package.  Check the total grams (g) of carbs in one serving. You can calculate the number of servings of carbs in one serving by dividing the total carbs by 15. For example, if a food has 30 g of total carbs, it would be equal to 2 servings of carbs.  Check the number of grams (g) of saturated and trans fats in one serving. Choose foods that have low or no amount of these fats.  Check the number of milligrams (mg) of salt (sodium) in one serving. Most people should limit total sodium intake to less than 2,300 mg per day.  Always check the nutrition information of foods labeled as "low-fat" or "nonfat". These foods may be higher in added sugar or refined carbs and should be avoided.  Talk to your dietitian to identify your daily goals for nutrients listed on the label. Shopping  Avoid buying canned, premade, or processed foods. These foods tend to be high in fat, sodium, and added sugar.  Shop around the outside edge of the grocery store. This includes fresh fruits and vegetables, bulk grains, fresh meats, and fresh dairy. Cooking  Use low-heat cooking methods, such as baking, instead of high-heat cooking methods like deep frying.  Cook using healthy oils, such as olive, canola, or sunflower oil.  Avoid cooking with butter, cream, or high-fat meats. Meal planning  Eat meals and snacks regularly, preferably at the same times every day. Avoid going long periods of time without eating.  Eat foods high in fiber, such as fresh fruits, vegetables, beans, and whole grains. Talk to your dietitian about how many servings of carbs you can eat at each meal.  Eat 4-6 ounces (oz) of lean protein each day, such as lean meat, chicken, fish, eggs, or tofu. One oz  of lean protein is equal to: ? 1 oz of meat, chicken, or fish. ? 1 egg. ?  cup of tofu.  Eat some foods each day that contain healthy fats, such as avocado, nuts, seeds, and fish. Lifestyle  Check your blood glucose regularly.  Exercise regularly as told by your health care provider. This may include: ? 150 minutes of moderate-intensity or vigorous-intensity exercise each week. This could be brisk walking, biking, or water aerobics. ? Stretching and doing strength exercises, such as yoga or weightlifting, at least 2 times a week.  Take medicines as told by your health care provider.  Do not use any products that contain nicotine or tobacco, such as cigarettes and e-cigarettes. If you need help quitting, ask your health care provider.  Work with a Social worker or diabetes educator to identify strategies to manage stress and any emotional and social challenges. Questions to ask a health care provider  Do I need to meet with a diabetes educator?  Do I need to meet with a dietitian?  What number can I call if I have questions?  When are the best times to check my blood glucose? Where to find more information:  American Diabetes Association: diabetes.org  Academy of Nutrition and Dietetics: www.eatright.[employer] of Diabetes and  Digestive and Kidney Diseases (NIH): [employer] Summary  A healthy meal plan will help you control your blood glucose and maintain a healthy lifestyle.  Working with a diet and nutrition specialist (dietitian) can help you make a meal plan that is best for you.  Keep in mind that carbohydrates (carbs) and alcohol have immediate effects on your blood glucose levels. It is important to count carbs and to use alcohol carefully. This information is not intended to replace advice given to you by your health care provider. Make sure you discuss any questions you have with your health care provider. Document Released: 04/10/2005 Document  Revised: 02/11/2017 Document Reviewed: 08/18/2016 Elsevier Interactive Patient Education  2019 Elsevier Inc.      Agustina Caroli, MD [facility]

## 2018-09-15 ENCOUNTER — Telehealth: Payer: Self-pay | Admitting: Emergency Medicine

## 2018-09-15 ENCOUNTER — Telehealth: Payer: Self-pay | Admitting: *Deleted

## 2018-09-15 LAB — COMPREHENSIVE METABOLIC PANEL
A/G RATIO: 1.1 — AB (ref 1.2–2.2)
ALT: 22 IU/L (ref 0–44)
AST: 24 IU/L (ref 0–40)
Albumin: 3.5 g/dL — ABNORMAL LOW (ref 3.8–4.8)
Alkaline Phosphatase: 98 IU/L (ref 39–117)
BILIRUBIN TOTAL: 0.2 mg/dL (ref 0.0–1.2)
BUN/Creatinine Ratio: 7 — ABNORMAL LOW (ref 10–24)
BUN: 12 mg/dL (ref 8–27)
CHLORIDE: 102 mmol/L (ref 96–106)
CO2: 21 mmol/L (ref 20–29)
Calcium: 9.1 mg/dL (ref 8.6–10.2)
Creatinine, Ser: 1.82 mg/dL — ABNORMAL HIGH (ref 0.76–1.27)
GFR calc Af Amer: 45 mL/min/{1.73_m2} — ABNORMAL LOW (ref >59)
GFR calc non Af Amer: 39 mL/min/{1.73_m2} — ABNORMAL LOW (ref >59)
Globulin, Total: 3.1 g/dL (ref 1.5–4.5)
Glucose: 125 mg/dL — ABNORMAL HIGH (ref 65–99)
POTASSIUM: 4.3 mmol/L (ref 3.5–5.2)
Sodium: 142 mmol/L (ref 134–144)
TOTAL PROTEIN: 6.6 g/dL (ref 6.0–8.5)

## 2018-09-15 LAB — HEMOGLOBIN A1C
Est. average glucose Bld gHb Est-mCnc: 223 mg/dL
Hgb A1c MFr Bld: 9.4 % — ABNORMAL HIGH (ref 4.8–5.6)

## 2018-09-15 LAB — CBC WITH DIFFERENTIAL/PLATELET
BASOS: 1 %
Basophils Absolute: 0.1 10*3/uL (ref 0.0–0.2)
EOS (ABSOLUTE): 0.4 10*3/uL (ref 0.0–0.4)
Eos: 7 %
Hematocrit: 21.3 % — ABNORMAL LOW (ref 37.5–51.0)
Hemoglobin: 6.9 g/dL — CL (ref 13.0–17.7)
Immature Grans (Abs): 0.1 10*3/uL (ref 0.0–0.1)
Immature Granulocytes: 1 %
LYMPHS ABS: 1.7 10*3/uL (ref 0.7–3.1)
Lymphs: 26 %
MCH: 29 pg (ref 26.6–33.0)
MCHC: 32.4 g/dL (ref 31.5–35.7)
MCV: 90 fL (ref 79–97)
MONOS ABS: 0.5 10*3/uL (ref 0.1–0.9)
Monocytes: 7 %
NEUTROS ABS: 3.8 10*3/uL (ref 1.4–7.0)
Neutrophils: 58 %
PLATELETS: 490 10*3/uL — AB (ref 150–450)
RBC: 2.38 x10E6/uL — CL (ref 4.14–5.80)
RDW: 13.3 % (ref 11.6–15.4)
WBC: 6.5 10*3/uL (ref 3.4–10.8)

## 2018-09-15 NOTE — Telephone Encounter (Signed)
Called patient's mobile number for the 4th time, call sent to voice mail and try the spouse's home number no success, disconnected.

## 2018-09-15 NOTE — Telephone Encounter (Signed)
Called patient for the 3rd time to advise him labs results from office visits 09/14/2018 are abnormal. He needs to go the Emergency Dept of his choice to be seen IMMEDIATELY. Message was left in voice mail.

## 2018-09-15 NOTE — Telephone Encounter (Signed)
Called patient at 8:20 am no answer and wife's number no voice mail set up and again 9:25 am to advise he needs to go to the Emergency Room IMMEDIATELY because his labs are ABNORMAL. Unable to reach patient no answer, call sent to voice mail message left.

## 2018-09-16 ENCOUNTER — Telehealth: Payer: Self-pay | Admitting: *Deleted

## 2018-09-16 NOTE — Telephone Encounter (Signed)
Pt returned call; he was advised that his that his lab results from office visit 09/14/2018 are abnormal: Hgb 6.9, creatinine 1.82; he verbalized understanding and says that he will go to [facility]; unable to chart in result note because no encounter created; pt seen by Dr Mitchel Honour, [place]; will route to office for notification.

## 2018-09-16 NOTE — Telephone Encounter (Signed)
Called patients home number (out of service) Left detailed message on cell phone Called patients wife number  Unable to leave voicemail full Called work number ( they stated patient is no longer an employee there)..   Please advise patient or wife if return call that patient needs to go to emergency room asap due to abnormal labs.

## 2018-09-17 ENCOUNTER — Telehealth: Payer: Self-pay | Admitting: *Deleted

## 2018-09-17 NOTE — Telephone Encounter (Signed)
Dr Mitchel Honour,  Mr Davison called back yesterday from his mobile phone to say he will go to [place] RN from the Center spoke to the patient on 09/16/2018.

## 2018-09-17 NOTE — Telephone Encounter (Signed)
Called mobile number multiple times (5) left messages in the voice mail to call back, because lab results are ABNORMAL and needs to go to emergency department.

## 2018-09-20 ENCOUNTER — Other Ambulatory Visit: Payer: Self-pay | Admitting: Emergency Medicine

## 2018-09-20 NOTE — Telephone Encounter (Signed)
Copied from [place] [id number]. Topic: Quick Communication - Rx Refill/Question >> Sep 20, 2018  1:47 PM [place], [place] D wrote: Medication: insulin glargine (LANTUS) 100 UNIT/ML injection  Has the patient contacted their pharmacy? Yes.   (Agent: If no, request that the patient contact the pharmacy for the refill.) (Agent: If yes, when and what did the pharmacy advise?)  Preferred Pharmacy (with phone number or street name): [id number] - [place], [place] [phone] (Phone) [phone] (Fax)  Agent: Please be advised that RX refills may take up to 3 business days. We ask that you follow-up with your pharmacy.

## 2018-09-20 NOTE — Telephone Encounter (Signed)
Requested medication (s) are due for refill today: yes  Requested medication (s) are on the active medication list: yes  Last refill:  Last refilled by historical provider  Future visit scheduled: yes  Notes to clinic:  Last refilled by historical provider    Requested Prescriptions  Pending Prescriptions Disp Refills   insulin glargine (LANTUS) 100 UNIT/ML injection 10 mL     Sig: Inject 0.45 mLs (45 Units total) into the skin daily. In the evening.     Endocrinology:  Diabetes - Insulins Failed - 09/20/2018  2:46 PM      Failed - HBA1C is between 0 and 7.9 and within 180 days    Hgb A1c MFr Bld  Date Value Ref Range Status  09/14/2018 9.4 (H) 4.8 - 5.6 % Final    Comment:             Prediabetes: 5.7 - 6.4          Diabetes: >6.4          Glycemic control for adults with diabetes: <7.0          Passed - Valid encounter within last 6 months    Recent Outpatient Visits          6 days ago Type 2 diabetes mellitus with hyperglycemia, unspecified whether long term insulin use [facility])   Primary Care at [facility], Ines Bloomer, MD   8 months ago Asymptomatic hypertension   Primary Care at [place], PA-C   8 months ago Asymptomatic hypertension   Primary Care at [place], PA-C   8 months ago Annual physical exam   Primary Care at [place] [facility], Audrie Lia, PA-C   1 year ago Encounter for examination required by Department of Transportation (DOT)   Primary Care at [facility], Audrie Lia, PA-C      Future Appointments            In 2 months [place], Ines Bloomer, MD Primary Care at [place], [facility]

## 2018-09-20 NOTE — Telephone Encounter (Signed)
Thanks

## 2018-09-24 MED ORDER — INSULIN GLARGINE 100 UNIT/ML ~~LOC~~ SOLN: 45.0000 [IU] | Freq: Every day | SUBCUTANEOUS | 3 refills | Status: DC

## 2018-09-24 NOTE — Telephone Encounter (Signed)
Request for Lantus 100 unit/ml inj   Last refill:  Last refilled by historical provider  Future visit scheduled: yes 12/15/2018 LOV 09/14/2018  See request below

## 2018-10-04 ENCOUNTER — Encounter: Payer: Self-pay | Admitting: Internal Medicine

## 2018-10-13 ENCOUNTER — Other Ambulatory Visit: Payer: Self-pay | Admitting: Emergency Medicine

## 2018-10-13 NOTE — Telephone Encounter (Signed)
Requested medication (s) are due for refill today - historical  Requested medication (s) are on the active medication list -yes  Future visit scheduled -yes  Last refill: historical  Notes to clinic: Patient passes protocol for refill- but is requesting a medication that is listed as historical. Sent for provider review.  Requested Prescriptions  Pending Prescriptions Disp Refills   pantoprazole (PROTONIX) 40 MG tablet [Pharmacy Med Name: PANTOPRAZOLE SOD DR 40 MG TAB] 32 tablet 0    Sig: TAKE 1 TABLET BY MOUTH 2 TIMES DAILY     Gastroenterology: Proton Pump Inhibitors Passed - 10/13/2018  3:22 PM      Passed - Valid encounter within last 12 months    Recent Outpatient Visits          4 weeks ago Type 2 diabetes mellitus with hyperglycemia, unspecified whether long term insulin use [facility])   Primary Care at [facility], Ines Bloomer, MD   9 months ago Asymptomatic hypertension   Primary Care at [facility], Audrie Lia, PA-C   9 months ago Asymptomatic hypertension   Primary Care at [place], PA-C   9 months ago Annual physical exam   Primary Care at [facility], Audrie Lia, PA-C   1 year ago Encounter for examination required by Department of Transportation (DOT)   Primary Care at [facility] [facility], Audrie Lia, PA-C      Future Appointments            In 2 months [place], Ines Bloomer, MD Primary Care at [place], [facility]            Requested Prescriptions  Pending Prescriptions Disp Refills   pantoprazole ([place]) 40 MG tablet [Pharmacy Med Name: PANTOPRAZOLE SOD DR 40 MG TAB] 32 tablet 0    Sig: TAKE 1 TABLET BY MOUTH 2 TIMES DAILY     Gastroenterology: Proton Pump Inhibitors Passed - 10/13/2018  3:22 PM      Passed - Valid encounter within last 12 months    Recent Outpatient Visits          4 weeks ago Type 2 diabetes mellitus with hyperglycemia, unspecified whether long term insulin use [facility])   Primary Care at [facility], Ines Bloomer, MD   9 months  ago Asymptomatic hypertension   Primary Care at [place], PA-C   9 months ago Asymptomatic hypertension   Primary Care at [place], PA-C   9 months ago Annual physical exam   Primary Care at [facility], Audrie Lia, PA-C   1 year ago Encounter for examination required by Department of Transportation (DOT)   Primary Care at [facility] [facility], Audrie Lia, PA-C      Future Appointments            In 2 months [place], Ines Bloomer, MD Primary Care at [place], [facility]

## 2018-10-20 ENCOUNTER — Other Ambulatory Visit: Payer: Self-pay | Admitting: Urology

## 2018-10-20 ENCOUNTER — Other Ambulatory Visit (HOSPITAL_COMMUNITY): Payer: Self-pay | Admitting: Urology

## 2018-10-20 DIAGNOSIS — D4102 Neoplasm of uncertain behavior of left kidney: Secondary | ICD-10-CM

## 2018-11-07 ENCOUNTER — Other Ambulatory Visit: Payer: Self-pay | Admitting: Physician Assistant

## 2018-11-07 DIAGNOSIS — I10 Essential (primary) hypertension: Secondary | ICD-10-CM

## 2018-11-18 ENCOUNTER — Telehealth: Payer: Self-pay | Admitting: Emergency Medicine

## 2018-11-18 NOTE — Telephone Encounter (Signed)
Amlodipine was not prescribed by this office so not sure where it came from.  He had been taking lisinopril HCTZ before.  Probably he was seen by some other office recently.  I know he had been to the hospital recently.  Call patient and inquire about this.  Thanks.

## 2018-11-18 NOTE — Telephone Encounter (Signed)
Copied from [place] [id number]. Topic: Quick Communication - Rx Refill/Question >> Nov 18, 2018 12:22 PM Scherrie Gerlach wrote: Medication: lisinopril-hydrochlorothiazide (PRINZIDE,ZESTORETIC) 20-25 MG tablet amLODipine (NORVASC) 10 MG tablet   Pt states [facility] amlodipine ready for pickup. But pt states he does not know if he should be taking that.  Pt has been on lisinopril HTZ. Pt would like to know which one he needs.  [id number] [place] [place] [phone] (Phone) [phone] (Fax)

## 2018-11-18 NOTE — Telephone Encounter (Signed)
Spoke with patient and told him we did not prescribe that so do not pick up and patient verbalized understanding.

## 2018-11-23 ENCOUNTER — Other Ambulatory Visit: Payer: Self-pay

## 2018-11-23 ENCOUNTER — Encounter: Payer: Self-pay | Admitting: Emergency Medicine

## 2018-11-23 ENCOUNTER — Telehealth (INDEPENDENT_AMBULATORY_CARE_PROVIDER_SITE_OTHER): Payer: Self-pay | Admitting: Emergency Medicine

## 2018-11-23 DIAGNOSIS — E1159 Type 2 diabetes mellitus with other circulatory complications: Secondary | ICD-10-CM

## 2018-11-23 DIAGNOSIS — I10 Essential (primary) hypertension: Secondary | ICD-10-CM

## 2018-11-23 DIAGNOSIS — E1142 Type 2 diabetes mellitus with diabetic polyneuropathy: Secondary | ICD-10-CM

## 2018-11-23 DIAGNOSIS — Z862 Personal history of diseases of the blood and blood-forming organs and certain disorders involving the immune mechanism: Secondary | ICD-10-CM

## 2018-11-23 DIAGNOSIS — Z794 Long term (current) use of insulin: Secondary | ICD-10-CM

## 2018-11-23 DIAGNOSIS — E1169 Type 2 diabetes mellitus with other specified complication: Secondary | ICD-10-CM

## 2018-11-23 MED ORDER — LISINOPRIL-HYDROCHLOROTHIAZIDE 20-25 MG PO TABS: 1.0000 | ORAL_TABLET | Freq: Every day | ORAL | 3 refills | Status: DC

## 2018-11-23 MED ORDER — GABAPENTIN 300 MG PO CAPS: 300.0000 mg | ORAL_CAPSULE | Freq: Two times a day (BID) | ORAL | 1 refills | Status: DC

## 2018-11-23 NOTE — Progress Notes (Signed)
Contacted patient to triage for 3:20 pm appointment. Patient states following up on his diabetes. Patient needs a refill on his medication Lisinopril-Hctz. Patient states he is having problems with numbness in his feet at night, it has been like this since about 08/2018 after discharged from the hospital. Patient wants to discuss this problem. Patient has been keeping a reading of glucose, this morning before eating it was 97 and this afternoon 88. Patient states he check his glucose at 5 times a day.

## 2018-11-23 NOTE — Progress Notes (Signed)
Lab Results  Component Value Date   HGBA1C 9.4 (H) 09/14/2018   BP Readings from Last 3 Encounters:  09/14/18 113/68  01/09/18 138/82  01/01/18 (!) 150/110   Lab Results  Component Value Date   CREATININE 1.82 (H) 09/14/2018   BUN 12 09/14/2018   NA 142 09/14/2018   K 4.3 09/14/2018   CL 102 09/14/2018   CO2 21 09/14/2018   Lab Results  Component Value Date   CHOL 142 12/31/2015   HDL 38 (L) 12/31/2015   LDLCALC 73 12/31/2015   TRIG 155 (H) 12/31/2015   CHOLHDL 3.7 12/31/2015      Telemedicine Encounter- SOAP NOTE Established Patient  This telephone encounter was conducted with the patient's (or proxy's) verbal consent via audio telecommunications: yes/no: Yes Patient was instructed to have this encounter in a suitably private space; and to only have persons present to whom they give permission to participate. In addition, patient identity was confirmed by use of name plus two identifiers (DOB and address).  I discussed the limitations, risks, security and privacy concerns of performing an evaluation and management service by telephone and the availability of in person appointments. I also discussed with the patient that there may be a patient responsible charge related to this service. The patient expressed understanding and agreed to proceed.  I spent a total of TIME; 0 MIN TO 60 MIN: 15 minutes talking with the patient or their proxy.  No chief complaint on file.  Diabetes follow-up Subjective   Alexander Cochran is a 63 y.o. male established patient. Telephone visit today for diabetes follow-up.  Presently taking 45 units of Lantus insulin in the evening.  Morning blood glucose in the 80s.  During the day from 90-100.  No other medication for diabetes. Has history of renal insufficiency stage III.  Complaining of intermittent pain and numbness to his feet, "like they are frozen".  Intermittently painful.  HPI   Patient Active Problem List   Diagnosis Date Noted  .  BMI 38.0-38.9,adult 10/21/2011  . 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 Hyperlipemia 10/21/2011  . HTN (hypertension) 10/21/2011    Past Medical History:  Diagnosis Date  . Hyperlipidemia   . Hypertension     Current Outpatient Medications  Medication Sig Dispense Refill  . insulin glargine (LANTUS) 100 UNIT/ML injection Inject 0.45 mLs (45 Units total) into the skin daily. In the evening. 40.5 mL 3  . lisinopril-hydrochlorothiazide (PRINZIDE,ZESTORETIC) 20-25 MG tablet Take 1 tablet by mouth daily. 90 tablet 3  . simvastatin (ZOCOR) 40 MG tablet Take 0.5 tablets (20 mg total) by mouth daily. 90 tablet 3  . Continuous Blood Gluc Sensor ([place]) MISC Sig as indicated 1 each 5  . pantoprazole (PROTONIX) 40 MG tablet Take 40 mg by mouth daily.     No current facility-administered medications for this visit.     No Known Allergies  Social History   Socioeconomic History  . Marital status: Married    Spouse name: Not on file  . Number of children: Not on file  . Years of education: Not on file  . Highest education level: Not on file  Occupational History  . Not on file  Social Needs  . Financial resource strain: Not on file  . Food insecurity:    Worry: Not on file    Inability: Not on file  . Transportation needs:    Medical: Not on file    Non-medical: Not on file  Tobacco  Use  . Smoking status: Former Smoker    Packs/day: 0.25    Years: 2.00    Pack years: 0.50    Last attempt to quit: 12/28/1973    Years since quitting: 44.9  . Smokeless tobacco: Never Used  Substance and Sexual Activity  . Alcohol use: Yes  . Drug use: No  . Sexual activity: Never  Lifestyle  . Physical activity:    Days per week: Not on file    Minutes per session: Not on file  . Stress: Not on file  Relationships  . Social connections:    Talks on phone: Not on file    Gets together: Not on file    Attends religious service: Not on file    Active member of  club or organization: Not on file    Attends meetings of clubs or organizations: Not on file    Relationship status: Not on file  . Intimate partner violence:    Fear of current or ex partner: Not on file    Emotionally abused: Not on file    Physically abused: Not on file    Forced sexual activity: Not on file  Other Topics Concern  . Not on file  Social History Narrative  . Not on file    Review of Systems  Constitutional: Negative.  Negative for chills and fever.  HENT: Negative for congestion and sore throat.   Respiratory: Negative for cough.   Cardiovascular: Negative for chest pain and palpitations.  Gastrointestinal: Negative for abdominal pain, diarrhea, nausea and vomiting.  Musculoskeletal: Negative for myalgias.  Neurological: Negative.  Negative for dizziness and headaches.  Endo/Heme/Allergies: Negative.   All other systems reviewed and are negative.   Objective   Vitals as reported by the patient: None available There were no vitals filed for this visit. Awake and oriented x3 in no apparent respiratory distress There are no diagnoses linked to this encounter.  Diagnoses and all orders for this visit:  Type 2 diabetes mellitus with other specified complication, with long-term current use of insulin ([place]) -     CBC with Differential/Platelet; Future -     Hemoglobin A1c; Future -     Comprehensive metabolic panel; Future -     Lipid panel; Future  Essential hypertension -     lisinopril-hydrochlorothiazide (ZESTORETIC) 20-25 MG tablet; Take 1 tablet by mouth daily. -     Comprehensive metabolic panel; Future  Diabetic polyneuropathy associated with type 2 diabetes mellitus (HCC) -     gabapentin (NEURONTIN) 300 MG capsule; Take 1 capsule (300 mg total) by mouth 2 (two) times daily for 30 days.  History of anemia -     CBC with Differential/Platelet; Future  Hypertension associated with type 2 diabetes mellitus (HCC)  Clinically stable.  Continue  present medications.  No changes. Office visit in the next 1 to 2 months.  I discussed the assessment and treatment plan with the patient. The patient was provided an opportunity to ask questions and all were answered. The patient agreed with the plan and demonstrated an understanding of the instructions.   The patient was advised to call back or seek an in-person evaluation if the symptoms worsen or if the condition fails to improve as anticipated.  I provided 15 minutes of non-face-to-face time during this encounter.  [name], MD  Primary Care at [facility]

## 2018-11-24 ENCOUNTER — Ambulatory Visit (HOSPITAL_COMMUNITY): Payer: Self-pay

## 2018-12-05 ENCOUNTER — Other Ambulatory Visit: Payer: Self-pay | Admitting: Physician Assistant

## 2018-12-05 DIAGNOSIS — I10 Essential (primary) hypertension: Secondary | ICD-10-CM

## 2018-12-06 ENCOUNTER — Telehealth: Payer: Self-pay | Admitting: Emergency Medicine

## 2018-12-06 NOTE — Telephone Encounter (Unsigned)
Copied from [place] [id number]. Topic: Quick Communication - Rx Refill/Question >> Dec 06, 2018 10:21 AM Yvette Rack wrote: Medication: True Plus syringes  Has the patient contacted their pharmacy? yes   Preferred Pharmacy (with phone number or street name): [id number] [place], [place] [phone] (Phone)  [phone] (Fax)  Agent: Please be advised that RX refills may take up to 3 business days. We ask that you follow-up with your pharmacy.

## 2018-12-09 NOTE — Telephone Encounter (Signed)
Pt states the hospital in [place] had written him an Rx for the syringes in the past, so he had plenty until now.  1 mm 29 gauge X 1/2 syringes  100 ea box  This is a new Rx so dr needs to write new script. Pt is going to pick up new insulin this weekend and needs asap.  [facility]   [id number] [place], [place] [phone] (Phone) [phone] (Fax)

## 2018-12-10 ENCOUNTER — Other Ambulatory Visit: Payer: Self-pay

## 2018-12-10 DIAGNOSIS — E1169 Type 2 diabetes mellitus with other specified complication: Secondary | ICD-10-CM

## 2018-12-10 DIAGNOSIS — Z794 Long term (current) use of insulin: Secondary | ICD-10-CM

## 2018-12-10 MED ORDER — "INSULIN SYRINGE 29G X 1/2"" 0.5 ML MISC": 0 refills | Status: DC

## 2018-12-10 NOTE — Telephone Encounter (Signed)
Rx has been sent to pharmacy

## 2018-12-15 ENCOUNTER — Ambulatory Visit: Payer: Self-pay | Admitting: Emergency Medicine

## 2018-12-21 ENCOUNTER — Ambulatory Visit (INDEPENDENT_AMBULATORY_CARE_PROVIDER_SITE_OTHER): Payer: Self-pay | Admitting: Emergency Medicine

## 2018-12-21 ENCOUNTER — Other Ambulatory Visit: Payer: Self-pay

## 2018-12-21 DIAGNOSIS — Z794 Long term (current) use of insulin: Secondary | ICD-10-CM

## 2018-12-21 DIAGNOSIS — I10 Essential (primary) hypertension: Secondary | ICD-10-CM

## 2018-12-21 DIAGNOSIS — Z862 Personal history of diseases of the blood and blood-forming organs and certain disorders involving the immune mechanism: Secondary | ICD-10-CM

## 2018-12-21 DIAGNOSIS — E1169 Type 2 diabetes mellitus with other specified complication: Secondary | ICD-10-CM

## 2018-12-21 LAB — LIPID PANEL

## 2018-12-22 ENCOUNTER — Other Ambulatory Visit: Payer: Self-pay | Admitting: Emergency Medicine

## 2018-12-22 DIAGNOSIS — E1165 Type 2 diabetes mellitus with hyperglycemia: Secondary | ICD-10-CM

## 2018-12-22 LAB — CBC WITH DIFFERENTIAL/PLATELET
Basophils Absolute: 0 10*3/uL (ref 0.0–0.2)
Basos: 0 %
EOS (ABSOLUTE): 0.1 10*3/uL (ref 0.0–0.4)
Eos: 3 %
Hematocrit: 35.3 % — ABNORMAL LOW (ref 37.5–51.0)
Hemoglobin: 11.6 g/dL — ABNORMAL LOW (ref 13.0–17.7)
Immature Grans (Abs): 0 10*3/uL (ref 0.0–0.1)
Immature Granulocytes: 0 %
Lymphocytes Absolute: 2.4 10*3/uL (ref 0.7–3.1)
Lymphs: 45 %
MCH: 29.3 pg (ref 26.6–33.0)
MCHC: 32.9 g/dL (ref 31.5–35.7)
MCV: 89 fL (ref 79–97)
Monocytes Absolute: 0.4 10*3/uL (ref 0.1–0.9)
Monocytes: 7 %
Neutrophils Absolute: 2.4 10*3/uL (ref 1.4–7.0)
Neutrophils: 45 %
Platelets: 145 10*3/uL — ABNORMAL LOW (ref 150–450)
RBC: 3.96 x10E6/uL — ABNORMAL LOW (ref 4.14–5.80)
RDW: 13.2 % (ref 11.6–15.4)
WBC: 5.3 10*3/uL (ref 3.4–10.8)

## 2018-12-22 LAB — COMPREHENSIVE METABOLIC PANEL
ALT: 38 IU/L (ref 0–44)
AST: 24 IU/L (ref 0–40)
Albumin/Globulin Ratio: 1.9 (ref 1.2–2.2)
Albumin: 4.7 g/dL (ref 3.8–4.8)
Alkaline Phosphatase: 66 IU/L (ref 39–117)
BUN/Creatinine Ratio: 12 (ref 10–24)
BUN: 21 mg/dL (ref 8–27)
Bilirubin Total: 0.3 mg/dL (ref 0.0–1.2)
CO2: 26 mmol/L (ref 20–29)
Calcium: 10 mg/dL (ref 8.6–10.2)
Chloride: 105 mmol/L (ref 96–106)
Creatinine, Ser: 1.73 mg/dL — ABNORMAL HIGH (ref 0.76–1.27)
GFR calc Af Amer: 48 mL/min/{1.73_m2} — ABNORMAL LOW (ref >59)
GFR calc non Af Amer: 42 mL/min/{1.73_m2} — ABNORMAL LOW (ref >59)
Globulin, Total: 2.5 g/dL (ref 1.5–4.5)
Glucose: 68 mg/dL (ref 65–99)
Potassium: 4.3 mmol/L (ref 3.5–5.2)
Sodium: 148 mmol/L — ABNORMAL HIGH (ref 134–144)
Total Protein: 7.2 g/dL (ref 6.0–8.5)

## 2018-12-22 LAB — HEMOGLOBIN A1C
Est. average glucose Bld gHb Est-mCnc: 105 mg/dL
Hgb A1c MFr Bld: 5.3 % (ref 4.8–5.6)

## 2018-12-22 LAB — LIPID PANEL
Chol/HDL Ratio: 3.7 ratio (ref 0.0–5.0)
Cholesterol, Total: 127 mg/dL (ref 100–199)
HDL: 34 mg/dL — ABNORMAL LOW (ref >39)
LDL Calculated: 49 mg/dL (ref 0–99)
Triglycerides: 218 mg/dL — ABNORMAL HIGH (ref 0–149)
VLDL Cholesterol Cal: 44 mg/dL — ABNORMAL HIGH (ref 5–40)

## 2018-12-22 NOTE — Telephone Encounter (Signed)
Requested medication (s) are due for refill today: yes  Requested medication (s) are on the active medication list: yes  Last refill:  09/14/2018  Future visit scheduled: no  Notes to clinic:  DX code needed for pharmacy  Requested Prescriptions  Pending Prescriptions Disp Refills   Continuous Blood Gluc Sensor (FREESTYLE LIBRE [place]) [place] [Pharmacy Med Name: FREESTYLE LIBRE 14 DAY SENSOR]  5    Sig: USE AS INDICATED     Endocrinology: Diabetes - Testing Supplies Passed - 12/22/2018 11:40 AM      Passed - Valid encounter within last 12 months    Recent Outpatient Visits          Yesterday Type 2 diabetes mellitus with other specified complication, with long-term current use of insulin [facility])   Primary Care at [facility], [place], MD   3 months ago Type 2 diabetes mellitus with hyperglycemia, unspecified whether long term insulin use [facility])   Primary Care at [facility], Ines Bloomer, MD   11 months ago Asymptomatic hypertension   Primary Care at [place], PA-C   11 months ago Asymptomatic hypertension   Primary Care at [facility], Audrie Lia, PA-C   11 months ago Annual physical exam   Primary Care at [place], PA-C

## 2018-12-29 ENCOUNTER — Encounter (HOSPITAL_COMMUNITY): Payer: Self-pay

## 2018-12-29 ENCOUNTER — Ambulatory Visit (HOSPITAL_COMMUNITY): Payer: Self-pay

## 2019-01-04 NOTE — Telephone Encounter (Signed)
error 

## 2019-01-31 ENCOUNTER — Other Ambulatory Visit: Payer: Self-pay | Admitting: Emergency Medicine

## 2019-01-31 DIAGNOSIS — E1142 Type 2 diabetes mellitus with diabetic polyneuropathy: Secondary | ICD-10-CM

## 2019-01-31 NOTE — Telephone Encounter (Signed)
Requested Prescriptions  Pending Prescriptions Disp Refills  . gabapentin (NEURONTIN) 300 MG capsule [Pharmacy Med Name: GABAPENTIN 300 MG CAPSULE] 60 capsule 1    Sig: TAKE 1 CAPSULE BY MOUTH TWICE A DAY     Neurology: Anticonvulsants - gabapentin Passed - 01/31/2019  1:17 PM      Passed - Valid encounter within last 12 months    Recent Outpatient Visits          1 month ago Type 2 diabetes mellitus with other specified complication, with long-term current use of insulin [facility])   Primary Care at [facility], [place], MD   4 months ago Type 2 diabetes mellitus with hyperglycemia, unspecified whether long term insulin use [facility])   Primary Care at [facility], Ines Bloomer, MD   1 year ago Asymptomatic hypertension   Primary Care at [facility] [facility], Audrie Lia, PA-C   1 year ago Asymptomatic hypertension   Primary Care at [facility], Audrie Lia, PA-C   1 year ago Annual physical exam   Primary Care at [place], PA-C

## 2019-02-14 ENCOUNTER — Other Ambulatory Visit: Payer: Self-pay | Admitting: Physician Assistant

## 2019-02-14 DIAGNOSIS — E785 Hyperlipidemia, unspecified: Secondary | ICD-10-CM

## 2019-03-08 ENCOUNTER — Other Ambulatory Visit: Payer: Self-pay | Admitting: Emergency Medicine

## 2019-03-08 DIAGNOSIS — E1169 Type 2 diabetes mellitus with other specified complication: Secondary | ICD-10-CM

## 2019-03-08 DIAGNOSIS — Z794 Long term (current) use of insulin: Secondary | ICD-10-CM

## 2019-03-15 ENCOUNTER — Other Ambulatory Visit: Payer: Self-pay | Admitting: Emergency Medicine

## 2019-03-15 DIAGNOSIS — E1165 Type 2 diabetes mellitus with hyperglycemia: Secondary | ICD-10-CM

## 2019-03-15 NOTE — Telephone Encounter (Signed)
Please advise 

## 2019-04-09 ENCOUNTER — Other Ambulatory Visit: Payer: Self-pay | Admitting: Emergency Medicine

## 2019-04-09 DIAGNOSIS — E1142 Type 2 diabetes mellitus with diabetic polyneuropathy: Secondary | ICD-10-CM

## 2019-05-24 ENCOUNTER — Other Ambulatory Visit: Payer: Self-pay | Admitting: Emergency Medicine

## 2019-05-24 DIAGNOSIS — E785 Hyperlipidemia, unspecified: Secondary | ICD-10-CM

## 2019-06-08 ENCOUNTER — Other Ambulatory Visit: Payer: Self-pay | Admitting: Emergency Medicine

## 2019-06-08 DIAGNOSIS — E1142 Type 2 diabetes mellitus with diabetic polyneuropathy: Secondary | ICD-10-CM

## 2019-06-08 DIAGNOSIS — Z794 Long term (current) use of insulin: Secondary | ICD-10-CM

## 2019-06-08 DIAGNOSIS — E1165 Type 2 diabetes mellitus with hyperglycemia: Secondary | ICD-10-CM

## 2019-06-08 DIAGNOSIS — E1169 Type 2 diabetes mellitus with other specified complication: Secondary | ICD-10-CM

## 2019-08-06 ENCOUNTER — Other Ambulatory Visit: Payer: Self-pay | Admitting: Emergency Medicine

## 2019-08-06 DIAGNOSIS — E1142 Type 2 diabetes mellitus with diabetic polyneuropathy: Secondary | ICD-10-CM

## 2019-08-24 ENCOUNTER — Other Ambulatory Visit: Payer: Self-pay | Admitting: Emergency Medicine

## 2019-08-24 NOTE — Telephone Encounter (Signed)
Requested medication (s) are due for refill today: yes  Requested medication (s) are on the active medication list: yes  Last refill:  06/03/2019  Future visit scheduled: no  Notes to clinic:  Patient due for follow up appointment    Requested Prescriptions  Pending Prescriptions Disp Refills   LANTUS 100 UNIT/ML injection [Pharmacy Med Name: LANTUS 100 UNIT/ML VIAL] 40 mL 3    Sig: INJECT [place]      Endocrinology:  Diabetes - Insulins Failed - 08/24/2019  1:10 AM      Failed - HBA1C is between 0 and 7.9 and within 180 days    Hgb A1c MFr Bld  Date Value Ref Range Status  12/21/2018 5.3 4.8 - 5.6 % Final    Comment:             Prediabetes: 5.7 - 6.4          Diabetes: >6.4          Glycemic control for adults with diabetes: <7.0           Failed - Valid encounter within last 6 months    Recent Outpatient Visits           8 months ago Type 2 diabetes mellitus with other specified complication, with long-term current use of insulin ([place])   Primary Care at [facility], [place], MD   9 months ago Type 2 diabetes mellitus with other specified complication, with long-term current use of insulin [facility])   Primary Care at [place], [place], MD   11 months ago Type 2 diabetes mellitus with hyperglycemia, unspecified whether long term insulin use [facility])   Primary Care at [facility], Ines Bloomer, MD   1 year ago Asymptomatic hypertension   Primary Care at [facility] [facility], Audrie Lia, PA-C   1 year ago Asymptomatic hypertension   Primary Care at [place], PA-C

## 2019-08-24 NOTE — Telephone Encounter (Signed)
No further refills without office visit 

## 2019-09-03 ENCOUNTER — Other Ambulatory Visit: Payer: Self-pay | Admitting: Emergency Medicine

## 2019-09-03 DIAGNOSIS — E1165 Type 2 diabetes mellitus with hyperglycemia: Secondary | ICD-10-CM

## 2019-09-03 NOTE — Telephone Encounter (Signed)
Requested Prescriptions  Pending Prescriptions Disp Refills  . Continuous Blood Gluc Sensor (FREESTYLE LIBRE 14 DAY SENSOR) [place]ISC [Pharmacy Med Name: FREESTYLE LIBRE 14 DAY SENSOR] 1 each 5    Sig: USE AS INDICATED TO CHECK BLOOD GLUCOSE FOR 66 DAYS     Endocrinology: Diabetes - Testing Supplies Passed - 09/03/2019 10:18 AM      Passed - Valid encounter within last 12 months    Recent Outpatient Visits          8 months ago Type 2 diabetes mellitus with other specified complication, with long-term current use of insulin [facility])   Primary Care at [facility], [place], MD   9 months ago Type 2 diabetes mellitus with other specified complication, with long-term current use of insulin [facility])   Primary Care at [facility], [place], MD   11 months ago Type 2 diabetes mellitus with hyperglycemia, unspecified whether long term insulin use [facility])   Primary Care at [facility], Ines Bloomer, MD   1 year ago Asymptomatic hypertension   Primary Care at [place] [facility], Audrie Lia, PA-C   1 year ago Asymptomatic hypertension   Primary Care at [facility], Audrie Lia, PA-C

## 2019-09-23 ENCOUNTER — Other Ambulatory Visit: Payer: Self-pay | Admitting: Emergency Medicine

## 2019-09-23 DIAGNOSIS — Z794 Long term (current) use of insulin: Secondary | ICD-10-CM

## 2019-09-23 DIAGNOSIS — E1169 Type 2 diabetes mellitus with other specified complication: Secondary | ICD-10-CM

## 2019-09-23 NOTE — Telephone Encounter (Signed)
Medication Refill - Medication:  LANTUS 100 UNIT/ML injection  Has the patient contacted their pharmacy? Yes advised to call office.  Preferred Pharmacy (with phone number or street name):  [id number] [place], [place] Phone:  [phone]  Fax:  [phone]       Agent: Please be advised that RX refills may take up to 3 business days. We ask that you follow-up with your pharmacy.

## 2019-09-23 NOTE — Telephone Encounter (Signed)
Requested medication (s) are due for refill today: Yes  Requested medication (s) are on the active medication list: Yes  Last refill:  08/24/19  Future visit scheduled: No  Notes to clinic:  Left message for pt. To call and make an appointment.    Requested Prescriptions  Pending Prescriptions Disp Refills   insulin glargine (LANTUS) 100 UNIT/ML injection 40 mL 0      Endocrinology:  Diabetes - Insulins Failed - 09/23/2019  8:56 AM      Failed - HBA1C is between 0 and 7.9 and within 180 days    Hgb A1c MFr Bld  Date Value Ref Range Status  12/21/2018 5.3 4.8 - 5.6 % Final    Comment:             Prediabetes: 5.7 - 6.4          Diabetes: >6.4          Glycemic control for adults with diabetes: <7.0           Failed - Valid encounter within last 6 months    Recent Outpatient Visits           9 months ago Type 2 diabetes mellitus with other specified complication, with long-term current use of insulin [facility])   Primary Care at [place], [place], MD   10 months ago Type 2 diabetes mellitus with other specified complication, with long-term current use of insulin [facility])   Primary Care at [place], [place], MD   1 year ago Type 2 diabetes mellitus with hyperglycemia, unspecified whether long term insulin use [facility])   Primary Care at [facility], Ines Bloomer, MD   1 year ago Asymptomatic hypertension   Primary Care at [place] [facility], Audrie Lia, PA-C   1 year ago Asymptomatic hypertension   Primary Care at [facility], Audrie Lia, PA-C

## 2019-09-23 NOTE — Telephone Encounter (Signed)
Requested medication (s) are due for refill today: Yes  Requested medication (s) are on the active medication list: Yes  Last refill:  06/08/19  Future visit scheduled: Yes  Notes to clinic:  Diagnosis code needed.    Requested Prescriptions  Pending Prescriptions Disp Refills   BD INSULIN SYRINGE U/F 30G X 1/2" 0.5 ML MISC [Pharmacy Med Name: BD INS SYR UF 0.5ML 12.7MMX30G]      Sig: INJECT 45 UNITS INTO THE SKIN DAILY      There is no refill protocol information for this order

## 2019-09-26 ENCOUNTER — Ambulatory Visit (INDEPENDENT_AMBULATORY_CARE_PROVIDER_SITE_OTHER): Payer: Self-pay | Admitting: Emergency Medicine

## 2019-09-26 ENCOUNTER — Encounter: Payer: Self-pay | Admitting: Emergency Medicine

## 2019-09-26 ENCOUNTER — Other Ambulatory Visit: Payer: Self-pay

## 2019-09-26 VITALS — BP 143/89 | HR 95 | Temp 97.4°F | Resp 16 | Ht 66.0 in | Wt 271.0 lb

## 2019-09-26 DIAGNOSIS — E1165 Type 2 diabetes mellitus with hyperglycemia: Secondary | ICD-10-CM

## 2019-09-26 DIAGNOSIS — N1831 Chronic kidney disease, stage 3a: Secondary | ICD-10-CM

## 2019-09-26 DIAGNOSIS — E1169 Type 2 diabetes mellitus with other specified complication: Secondary | ICD-10-CM | POA: Insufficient documentation

## 2019-09-26 DIAGNOSIS — E785 Hyperlipidemia, unspecified: Secondary | ICD-10-CM

## 2019-09-26 DIAGNOSIS — I10 Essential (primary) hypertension: Secondary | ICD-10-CM

## 2019-09-26 DIAGNOSIS — E1159 Type 2 diabetes mellitus with other circulatory complications: Secondary | ICD-10-CM

## 2019-09-26 DIAGNOSIS — E1142 Type 2 diabetes mellitus with diabetic polyneuropathy: Secondary | ICD-10-CM | POA: Insufficient documentation

## 2019-09-26 LAB — POCT GLYCOSYLATED HEMOGLOBIN (HGB A1C): Hemoglobin A1C: 6.6 % — AB (ref 4.0–5.6)

## 2019-09-26 LAB — GLUCOSE, POCT (MANUAL RESULT ENTRY): POC Glucose: 162 mg/dl — AB (ref 70–99)

## 2019-09-26 MED ORDER — INSULIN GLARGINE 100 UNIT/ML ~~LOC~~ SOLN: SUBCUTANEOUS | 11 refills | Status: DC

## 2019-09-26 NOTE — Patient Instructions (Addendum)
   If you have lab work done today you will be contacted with your lab results within the next 2 weeks.  If you have not heard from us then please contact us. The fastest way to get your results is to register for My Chart.   IF you received an x-ray today, you will receive an invoice from [facility]. Please contact  Radiology at [phone] with questions or concerns regarding your invoice.   IF you received labwork today, you will receive an invoice from [facility]. Please contact [facility] at [phone] with questions or concerns regarding your invoice.   Our billing staff will not be able to assist you with questions regarding bills from these companies.  You will be contacted with the lab results as soon as they are available. The fastest way to get your results is to activate your My Chart account. Instructions are located on the last page of this paperwork. If you have not heard from us regarding the results in 2 weeks, please contact this office.     Diabetes Mellitus and Nutrition, Adult When you have diabetes (diabetes mellitus), it is very important to have healthy eating habits because your blood sugar (glucose) levels are greatly affected by what you eat and drink. Eating healthy foods in the appropriate amounts, at about the same times every day, can help you:  Control your blood glucose.  Lower your risk of heart disease.  Improve your blood pressure.  Reach or maintain a healthy weight. Every person with diabetes is different, and each person has different needs for a meal plan. Your health care provider may recommend that you work with a diet and nutrition specialist (dietitian) to make a meal plan that is best for you. Your meal plan may vary depending on factors such as:  The calories you need.  The medicines you take.  Your weight.  Your blood glucose, blood pressure, and cholesterol levels.  Your activity level.  Other health conditions  you have, such as heart or kidney disease. How do carbohydrates affect me? Carbohydrates, also called carbs, affect your blood glucose level more than any other type of food. Eating carbs naturally raises the amount of glucose in your blood. Carb counting is a method for keeping track of how many carbs you eat. Counting carbs is important to keep your blood glucose at a healthy level, especially if you use insulin or take certain oral diabetes medicines. It is important to know how many carbs you can safely have in each meal. This is different for every person. Your dietitian can help you calculate how many carbs you should have at each meal and for each snack. Foods that contain carbs include:  Bread, cereal, rice, pasta, and crackers.  Potatoes and corn.  Peas, beans, and lentils.  Milk and yogurt.  Fruit and juice.  Desserts, such as cakes, cookies, ice cream, and candy. How does alcohol affect me? Alcohol can cause a sudden decrease in blood glucose (hypoglycemia), especially if you use insulin or take certain oral diabetes medicines. Hypoglycemia can be a life-threatening condition. Symptoms of hypoglycemia (sleepiness, dizziness, and confusion) are similar to symptoms of having too much alcohol. If your health care provider says that alcohol is safe for you, follow these guidelines:  Limit alcohol intake to no more than 1 drink per day for nonpregnant women and 2 drinks per day for men. One drink equals 12 oz of beer, 5 oz of wine, or 1 oz of hard liquor.    Do not drink on an empty stomach.  Keep yourself hydrated with water, diet soda, or unsweetened iced tea.  Keep in mind that regular soda, juice, and other mixers may contain a lot of sugar and must be counted as carbs. What are tips for following this plan?  Reading food labels  Start by checking the serving size on the "Nutrition Facts" label of packaged foods and drinks. The amount of calories, carbs, fats, and other  nutrients listed on the label is based on one serving of the item. Many items contain more than one serving per package.  Check the total grams (g) of carbs in one serving. You can calculate the number of servings of carbs in one serving by dividing the total carbs by 15. For example, if a food has 30 g of total carbs, it would be equal to 2 servings of carbs.  Check the number of grams (g) of saturated and trans fats in one serving. Choose foods that have low or no amount of these fats.  Check the number of milligrams (mg) of salt (sodium) in one serving. Most people should limit total sodium intake to less than 2,300 mg per day.  Always check the nutrition information of foods labeled as "low-fat" or "nonfat". These foods may be higher in added sugar or refined carbs and should be avoided.  Talk to your dietitian to identify your daily goals for nutrients listed on the label. Shopping  Avoid buying canned, premade, or processed foods. These foods tend to be high in fat, sodium, and added sugar.  Shop around the outside edge of the grocery store. This includes fresh fruits and vegetables, bulk grains, fresh meats, and fresh dairy. Cooking  Use low-heat cooking methods, such as baking, instead of high-heat cooking methods like deep frying.  Cook using healthy oils, such as olive, canola, or sunflower oil.  Avoid cooking with butter, cream, or high-fat meats. Meal planning  Eat meals and snacks regularly, preferably at the same times every day. Avoid going long periods of time without eating.  Eat foods high in fiber, such as fresh fruits, vegetables, beans, and whole grains. Talk to your dietitian about how many servings of carbs you can eat at each meal.  Eat 4-6 ounces (oz) of lean protein each day, such as lean meat, chicken, fish, eggs, or tofu. One oz of lean protein is equal to: ? 1 oz of meat, chicken, or fish. ? 1 egg. ?  cup of tofu.  Eat some foods each day that contain  healthy fats, such as avocado, nuts, seeds, and fish. Lifestyle  Check your blood glucose regularly.  Exercise regularly as told by your health care provider. This may include: ? 150 minutes of moderate-intensity or vigorous-intensity exercise each week. This could be brisk walking, biking, or water aerobics. ? Stretching and doing strength exercises, such as yoga or weightlifting, at least 2 times a week.  Take medicines as told by your health care provider.  Do not use any products that contain nicotine or tobacco, such as cigarettes and e-cigarettes. If you need help quitting, ask your health care provider.  Work with a counselor or diabetes educator to identify strategies to manage stress and any emotional and social challenges. Questions to ask a health care provider  Do I need to meet with a diabetes educator?  Do I need to meet with a dietitian?  What number can I call if I have questions?  When are the best times to   times to check my blood glucose? Where to find more information:  American Diabetes Association: diabetes.org  Academy of Nutrition and Dietetics: www.eatright.org  National Institute of Diabetes and Digestive and Kidney Diseases (NIH): www.niddk.nih.gov Summary  A healthy meal plan will help you control your blood glucose and maintain a healthy lifestyle.  Working with a diet and nutrition specialist (dietitian) can help you make a meal plan that is best for you.  Keep in mind that carbohydrates (carbs) and alcohol have immediate effects on your blood glucose levels. It is important to count carbs and to use alcohol carefully. This information is not intended to replace advice given to you by your health care provider. Make sure you discuss any questions you have with your health care provider. Document Revised: 06/26/2017 Document Reviewed: 08/18/2016 Elsevier Patient Education  2020 Elsevier Inc.  

## 2019-09-26 NOTE — Assessment & Plan Note (Signed)
Hemoglobin A1c 6.6 today higher than before.  Diet and nutrition discussed with patient.  Continue Lantus insulin in the evening. Referred to endocrinology for further evaluation. Follow-up in 3 months. Continue Zestoretic and simvastatin. No medication changes.

## 2019-09-26 NOTE — Progress Notes (Signed)
Alexander Cochran 64 y.o.   Chief Complaint  Patient presents with  . Medication Refill    LANTUS    HISTORY OF PRESENT ILLNESS: This is a 64 y.o. male with history of diabetes here for follow-up and medication refill.  Last office visit 09/14/2018 with telemedicine visit on April 2020.  Has history of diabetic neuropathy and chronic kidney disease stage III as well as hypertension. Presently on Lantus insulin 45 units in the evening.  Also gabapentin for neuropathy pain. Lab Results  Component Value Date   HGBA1C 5.3 12/21/2018   BP Readings from Last 3 Encounters:  09/26/19 (!) 143/89  09/14/18 113/68  01/09/18 138/82   Lab Results  Component Value Date   CREATININE 1.73 (H) 12/21/2018   BUN 21 12/21/2018   NA 148 (H) 12/21/2018   K 4.3 12/21/2018   CL 105 12/21/2018   CO2 26 12/21/2018   Lab Results  Component Value Date   CHOL 127 12/21/2018   HDL 34 (L) 12/21/2018   LDLCALC 49 12/21/2018   TRIG 218 (H) 12/21/2018   CHOLHDL 3.7 12/21/2018    HPI   Prior to Admission medications   Medication Sig Start Date End Date Taking? Authorizing Provider  gabapentin (NEURONTIN) 300 MG capsule TAKE 1 CAPSULE BY MOUTH TWICE A DAY 08/06/19  Yes Jannah Guardiola, Ines Bloomer, MD  insulin glargine (LANTUS) 100 UNIT/ML injection INJECT 45 UNITS INTO THE SKIN DAILY IN THE EVENING 09/26/19  Yes Rolena Knutson, Ines Bloomer, MD  lisinopril-hydrochlorothiazide (ZESTORETIC) 20-25 MG tablet Take 1 tablet by mouth daily. 11/23/18  Yes Zavalla, Ines Bloomer, MD  BD INSULIN SYRINGE U/F 30G X 1/2" 0.5 ML MISC INJECT 45 UNITS INTO THE SKIN DAILY 06/08/19   [name], MD  Continuous Blood Gluc Sensor (FREESTYLE LIBRE 14 DAY SENSOR) MISC USE AS INDICATED TO CHECK BLOOD GLUCOSE FOR 14 DAYS 09/03/19   [name], MD  pantoprazole (PROTONIX) 40 MG tablet Take 40 mg by mouth daily.    [provider]  simvastatin (ZOCOR) 40 MG tablet TAKE 1 TABLET BY MOUTH EVERY DAY Patient not taking:  Reported on 09/26/2019 05/24/19   [name], MD    No Known Allergies  Patient Active Problem List   Diagnosis Date Noted  . Diabetic polyneuropathy associated with type 2 diabetes mellitus ([place]) 09/26/2019  . Type 2 diabetes mellitus with hyperglycemia ([place]) 09/26/2019  . Dyslipidemia associated with type 2 diabetes mellitus ([place]) 09/26/2019  . Stage 3a chronic kidney disease 09/26/2019  . BMI 38.0-38.9,adult 10/21/2011  . 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 Hyperlipemia 10/21/2011  . Hypertension associated with type 2 diabetes mellitus ([place]) 10/21/2011    Past Medical History:  Diagnosis Date  . Hyperlipidemia   . Hypertension     Past Surgical History:  Procedure Laterality Date  . CYST EXCISION PERINEAL  1984    Social History   Socioeconomic History  . Marital status: Married    Spouse name: Not on file  . Number of children: Not on file  . Years of education: Not on file  . Highest education level: Not on file  Occupational History  . Not on file  Tobacco Use  . Smoking status: Former Smoker    Packs/day: 0.25    Years: 2.00    Pack years: 0.50    Quit date: 12/28/1973    Years since quitting: 45.7  . Smokeless tobacco: Never Used  Substance and Sexual Activity  . Alcohol use: Yes  .  Drug use: No  . Sexual activity: Never  Other Topics Concern  . Not on file  Social History Narrative  . Not on file   Social Determinants of Health   Financial Resource Strain:   . Difficulty of Paying Living Expenses: Not on file  Food Insecurity:   . Worried About Charity fundraiser in the Last Year: Not on file  . Ran Out of Food in the Last Year: Not on file  Transportation Needs:   . Lack of Transportation (Medical): Not on file  . Lack of Transportation (Non-Medical): Not on file  Physical Activity:   . Days of Exercise per Week: Not on file  . Minutes of Exercise per Session: Not on file  Stress:   . Feeling of Stress : Not on file   Social Connections:   . Frequency of Communication with Friends and Family: Not on file  . Frequency of Social Gatherings with Friends and Family: Not on file  . Attends Religious Services: Not on file  . Active Member of Clubs or Organizations: Not on file  . Attends Archivist Meetings: Not on file  . Marital Status: Not on file  Intimate Partner Violence:   . Fear of Current or Ex-Partner: Not on file  . Emotionally Abused: Not on file  . Physically Abused: Not on file  . Sexually Abused: Not on file    Family History  Problem Relation Age of Onset  . Hypertension Sister   . Hypertension Brother      Review of Systems  Constitutional: Negative.  Negative for chills and fever.  HENT: Negative.  Negative for congestion and sore throat.   Respiratory: Negative.  Negative for cough and shortness of breath.   Cardiovascular: Negative.  Negative for chest pain and palpitations.  Gastrointestinal: Negative.  Negative for abdominal pain, blood in stool, diarrhea, melena, nausea and vomiting.  Genitourinary: Negative.  Negative for dysuria and urgency.  Musculoskeletal: Negative.  Negative for myalgias and neck pain.  Skin: Negative.  Negative for rash.  Neurological: Negative for dizziness and headaches.  All other systems reviewed and are negative.  Today's Vitals   09/26/19 1402  BP: (!) 143/89  Pulse: 95  Resp: 16  Temp: (!) 97.4 F (36.3 C)  TempSrc: Temporal  SpO2: 95%  Weight: 271 lb (122.9 kg)  Height: 5\' 6"  (1.676 m)   Body mass index is 43.74 kg/m.   Physical Exam Vitals reviewed.  Constitutional:      Appearance: Normal appearance. He is obese.  HENT:     Head: Normocephalic.  Eyes:     Extraocular Movements: Extraocular movements intact.     Pupils: Pupils are equal, round, and reactive to light.  Cardiovascular:     Rate and Rhythm: Normal rate and regular rhythm.     Pulses: Normal pulses.     Heart sounds: Normal heart sounds.   Pulmonary:     Effort: Pulmonary effort is normal.     Breath sounds: Normal breath sounds.  Musculoskeletal:        General: Normal range of motion.     Cervical back: Normal range of motion and neck supple.  Skin:    General: Skin is warm and dry.     Capillary Refill: Capillary refill takes less than 2 seconds.  Neurological:     General: No focal deficit present.     Mental Status: He is alert and oriented to person, place, and time.  Psychiatric:  Mood and Affect: Mood normal.        Behavior: Behavior normal.      Results for orders placed or performed in visit on 09/26/19 (from the past 24 hour(s))  POCT glucose (manual entry)     Status: Abnormal   Collection Time: 09/26/19  2:55 PM  Result Value Ref Range   POC Glucose 162 (A) 70 - 99 mg/dl  POCT glycosylated hemoglobin (Hb A1C)     Status: Abnormal   Collection Time: 09/26/19  3:00 PM  Result Value Ref Range   Hemoglobin A1C 6.6 (A) 4.0 - 5.6 %   HbA1c POC (<> result, manual entry)     HbA1c, POC (prediabetic range)     HbA1c, POC (controlled diabetic range)       ASSESSMENT & PLAN: Hypertension associated with type 2 diabetes mellitus (HCC) Hemoglobin A1c 6.6 today higher than before.  Diet and nutrition discussed with patient.  Continue Lantus insulin in the evening. Referred to endocrinology for further evaluation. Follow-up in 3 months. Continue Zestoretic and simvastatin. No medication changes.  Alexander Cochran was seen today for medication refill.  Diagnoses and all orders for this visit:  Hypertension associated with type 2 diabetes mellitus ([place]) -     insulin glargine (LANTUS) 100 UNIT/ML injection; INJECT 45 UNITS INTO THE SKIN DAILY IN THE EVENING -     Comprehensive metabolic panel -     CBC -     POCT glucose (manual entry) -     POCT glycosylated hemoglobin (Hb A1C)  Diabetic polyneuropathy associated with type 2 diabetes mellitus ([place]) -     Ambulatory referral to Endocrinology  Type 2  diabetes mellitus with hyperglycemia, unspecified whether long term insulin use ([place]) -     Ambulatory referral to Endocrinology  Dyslipidemia associated with type 2 diabetes mellitus ([place]) -     Lipid panel  Stage 3a chronic kidney disease    Patient Instructions       If you have lab work done today you will be contacted with your lab results within the next 2 weeks.  If you have not heard from [place] then please contact us. The fastest way to get your results is to register for My Chart.   IF you received an x-ray today, you will receive an invoice from [facility]. Please contact [facility] at [phone] with questions or concerns regarding your invoice.   IF you received labwork today, you will receive an invoice from [facility]. Please contact [facility] at [phone] with questions or concerns regarding your invoice.   Our billing staff will not be able to assist you with questions regarding bills from these companies.  You will be contacted with the lab results as soon as they are available. The fastest way to get your results is to activate your My Chart account. Instructions are located on the last page of this paperwork. If you have not heard from [place] regarding the results in 2 weeks, please contact this office.     Diabetes Mellitus and Nutrition, Adult When you have diabetes (diabetes mellitus), it is very important to have healthy eating habits because your blood sugar (glucose) levels are greatly affected by what you eat and drink. Eating healthy foods in the appropriate amounts, at about the same times every day, can help you:  Control your blood glucose.  Lower your risk of heart disease.  Improve your blood pressure.  Reach or maintain a healthy weight. Every person with diabetes is different,  and each person has different needs for a meal plan. Your health care provider may recommend that you work with a diet and nutrition specialist (dietitian)  to make a meal plan that is best for you. Your meal plan may vary depending on factors such as:  The calories you need.  The medicines you take.  Your weight.  Your blood glucose, blood pressure, and cholesterol levels.  Your activity level.  Other health conditions you have, such as heart or kidney disease. How do carbohydrates affect me? Carbohydrates, also called carbs, affect your blood glucose level more than any other type of food. Eating carbs naturally raises the amount of glucose in your blood. Carb counting is a method for keeping track of how many carbs you eat. Counting carbs is important to keep your blood glucose at a healthy level, especially if you use insulin or take certain oral diabetes medicines. It is important to know how many carbs you can safely have in each meal. This is different for every person. Your dietitian can help you calculate how many carbs you should have at each meal and for each snack. Foods that contain carbs include:  Bread, cereal, rice, pasta, and crackers.  Potatoes and corn.  Peas, beans, and lentils.  Milk and yogurt.  Fruit and juice.  Desserts, such as cakes, cookies, ice cream, and candy. How does alcohol affect me? Alcohol can cause a sudden decrease in blood glucose (hypoglycemia), especially if you use insulin or take certain oral diabetes medicines. Hypoglycemia can be a life-threatening condition. Symptoms of hypoglycemia (sleepiness, dizziness, and confusion) are similar to symptoms of having too much alcohol. If your health care provider says that alcohol is safe for you, follow these guidelines:  Limit alcohol intake to no more than 1 drink per day for nonpregnant women and 2 drinks per day for men. One drink equals 12 oz of beer, 5 oz of wine, or 1 oz of hard liquor.  Do not drink on an empty stomach.  Keep yourself hydrated with water, diet soda, or unsweetened iced tea.  Keep in mind that regular soda, juice, and other  mixers may contain a lot of sugar and must be counted as carbs. What are tips for following this plan?  Reading food labels  Start by checking the serving size on the "Nutrition Facts" label of packaged foods and drinks. The amount of calories, carbs, fats, and other nutrients listed on the label is based on one serving of the item. Many items contain more than one serving per package.  Check the total grams (g) of carbs in one serving. You can calculate the number of servings of carbs in one serving by dividing the total carbs by 15. For example, if a food has 30 g of total carbs, it would be equal to 2 servings of carbs.  Check the number of grams (g) of saturated and trans fats in one serving. Choose foods that have low or no amount of these fats.  Check the number of milligrams (mg) of salt (sodium) in one serving. Most people should limit total sodium intake to less than 2,300 mg per day.  Always check the nutrition information of foods labeled as "low-fat" or "nonfat". These foods may be higher in added sugar or refined carbs and should be avoided.  Talk to your dietitian to identify your daily goals for nutrients listed on the label. Shopping  Avoid buying canned, premade, or processed foods. These foods tend to be high  in fat, sodium, and added sugar.  Shop around the outside edge of the grocery store. This includes fresh fruits and vegetables, bulk grains, fresh meats, and fresh dairy. Cooking  Use low-heat cooking methods, such as baking, instead of high-heat cooking methods like deep frying.  Cook using healthy oils, such as olive, canola, or sunflower oil.  Avoid cooking with butter, cream, or high-fat meats. Meal planning  Eat meals and snacks regularly, preferably at the same times every day. Avoid going long periods of time without eating.  Eat foods high in fiber, such as fresh fruits, vegetables, beans, and whole grains. Talk to your dietitian about how many servings  of carbs you can eat at each meal.  Eat 4-6 ounces (oz) of lean protein each day, such as lean meat, chicken, fish, eggs, or tofu. One oz of lean protein is equal to: ? 1 oz of meat, chicken, or fish. ? 1 egg. ?  cup of tofu.  Eat some foods each day that contain healthy fats, such as avocado, nuts, seeds, and fish. Lifestyle  Check your blood glucose regularly.  Exercise regularly as told by your health care provider. This may include: ? 150 minutes of moderate-intensity or vigorous-intensity exercise each week. This could be brisk walking, biking, or water aerobics. ? Stretching and doing strength exercises, such as yoga or weightlifting, at least 2 times a week.  Take medicines as told by your health care provider.  Do not use any products that contain nicotine or tobacco, such as cigarettes and e-cigarettes. If you need help quitting, ask your health care provider.  Work with a Social worker or diabetes educator to identify strategies to manage stress and any emotional and social challenges. Questions to ask a health care provider  Do I need to meet with a diabetes educator?  Do I need to meet with a dietitian?  What number can I call if I have questions?  When are the best times to check my blood glucose? Where to find more information:  American Diabetes Association: diabetes.org  Academy of Nutrition and Dietetics: www.eatright.[employer] of Diabetes and Digestive and Kidney Diseases (NIH): [employer] Summary  A healthy meal plan will help you control your blood glucose and maintain a healthy lifestyle.  Working with a diet and nutrition specialist (dietitian) can help you make a meal plan that is best for you.  Keep in mind that carbohydrates (carbs) and alcohol have immediate effects on your blood glucose levels. It is important to count carbs and to use alcohol carefully. This information is not intended to replace advice given to you by your  health care provider. Make sure you discuss any questions you have with your health care provider. Document Revised: 06/26/2017 Document Reviewed: 08/18/2016 Elsevier Patient Education  2020 Elsevier Inc.      Agustina Caroli, MD [facility]

## 2019-09-27 ENCOUNTER — Telehealth: Payer: Self-pay | Admitting: Emergency Medicine

## 2019-09-27 LAB — COMPREHENSIVE METABOLIC PANEL
ALT: 20 IU/L (ref 0–44)
AST: 14 IU/L (ref 0–40)
Albumin/Globulin Ratio: 2.1 (ref 1.2–2.2)
Albumin: 4.8 g/dL (ref 3.8–4.8)
Alkaline Phosphatase: 70 IU/L (ref 39–117)
BUN/Creatinine Ratio: 10 (ref 10–24)
BUN: 16 mg/dL (ref 8–27)
Bilirubin Total: 0.4 mg/dL (ref 0.0–1.2)
CO2: 22 mmol/L (ref 20–29)
Calcium: 9.6 mg/dL (ref 8.6–10.2)
Chloride: 104 mmol/L (ref 96–106)
Creatinine, Ser: 1.6 mg/dL — ABNORMAL HIGH (ref 0.76–1.27)
GFR calc Af Amer: 53 mL/min/{1.73_m2} — ABNORMAL LOW (ref >59)
GFR calc non Af Amer: 45 mL/min/{1.73_m2} — ABNORMAL LOW (ref >59)
Globulin, Total: 2.3 g/dL (ref 1.5–4.5)
Glucose: 161 mg/dL — ABNORMAL HIGH (ref 65–99)
Potassium: 3.8 mmol/L (ref 3.5–5.2)
Sodium: 146 mmol/L — ABNORMAL HIGH (ref 134–144)
Total Protein: 7.1 g/dL (ref 6.0–8.5)

## 2019-09-27 LAB — LIPID PANEL
Chol/HDL Ratio: 4.4 ratio (ref 0.0–5.0)
Cholesterol, Total: 150 mg/dL (ref 100–199)
HDL: 34 mg/dL — ABNORMAL LOW (ref >39)
LDL Chol Calc (NIH): 51 mg/dL (ref 0–99)
Triglycerides: 437 mg/dL — ABNORMAL HIGH (ref 0–149)
VLDL Cholesterol Cal: 65 mg/dL — ABNORMAL HIGH (ref 5–40)

## 2019-09-27 LAB — CBC
Hematocrit: 36.3 % — ABNORMAL LOW (ref 37.5–51.0)
Hemoglobin: 12.1 g/dL — ABNORMAL LOW (ref 13.0–17.7)
MCH: 30.2 pg (ref 26.6–33.0)
MCHC: 33.3 g/dL (ref 31.5–35.7)
MCV: 91 fL (ref 79–97)
Platelets: 131 10*3/uL — ABNORMAL LOW (ref 150–450)
RBC: 4.01 x10E6/uL — ABNORMAL LOW (ref 4.14–5.80)
RDW: 13.1 % (ref 11.6–15.4)
WBC: 5.6 10*3/uL (ref 3.4–10.8)

## 2019-09-27 NOTE — Telephone Encounter (Signed)
[employer] received a fax in error for this patient  . Provider name Jeanann Lewandowsky is retired and is not provider in their  office

## 2019-10-07 ENCOUNTER — Other Ambulatory Visit: Payer: Self-pay | Admitting: Emergency Medicine

## 2019-10-07 DIAGNOSIS — E1159 Type 2 diabetes mellitus with other circulatory complications: Secondary | ICD-10-CM

## 2019-10-18 ENCOUNTER — Other Ambulatory Visit: Payer: Self-pay | Admitting: Emergency Medicine

## 2019-10-18 DIAGNOSIS — E1142 Type 2 diabetes mellitus with diabetic polyneuropathy: Secondary | ICD-10-CM

## 2019-10-24 ENCOUNTER — Telehealth: Payer: Self-pay | Admitting: Emergency Medicine

## 2019-10-24 NOTE — Telephone Encounter (Signed)
Please Advise

## 2019-10-24 NOTE — Telephone Encounter (Signed)
Received via fax  Disability forms to be filled out  They have been placed in the cma box at nurses station

## 2019-10-26 NOTE — Telephone Encounter (Signed)
The disability forms from [facility] requested patient medical records, [employer] company handles the request for records and other information needed to process the forms because of detail information. I spoke to [place] Solicitor) here.

## 2019-11-01 ENCOUNTER — Other Ambulatory Visit: Payer: Self-pay | Admitting: Emergency Medicine

## 2019-11-01 DIAGNOSIS — E785 Hyperlipidemia, unspecified: Secondary | ICD-10-CM

## 2019-11-01 NOTE — Telephone Encounter (Signed)
Requested Prescriptions  Pending Prescriptions Disp Refills  . simvastatin (ZOCOR) 40 MG tablet [Pharmacy Med Name: SIMVASTATIN 40 MG TABLET] 90 tablet 0    Sig: TAKE 1 TABLET BY MOUTH EVERY DAY     Cardiovascular:  Antilipid - Statins Failed - 11/01/2019 10:16 AM      Failed - HDL in normal range and within 360 days    HDL  Date Value Ref Range Status  09/26/2019 34 (L) >39 mg/dL Final         Failed - Triglycerides in normal range and within 360 days    Triglycerides  Date Value Ref Range Status  09/26/2019 437 (H) 0 - 149 mg/dL Final         Passed - Total Cholesterol in normal range and within 360 days    Cholesterol, Total  Date Value Ref Range Status  09/26/2019 150 100 - 199 mg/dL Final         Passed - LDL in normal range and within 360 days    LDL Chol Calc (NIH)  Date Value Ref Range Status  09/26/2019 51 0 - 99 mg/dL Final         Passed - Patient is not pregnant      Passed - Valid encounter within last 12 months    Recent Outpatient Visits          1 month ago Hypertension associated with type 2 diabetes mellitus ([place])   Primary Care at [place], [place], MD   10 months ago Type 2 diabetes mellitus with other specified complication, with long-term current use of insulin [facility])   Primary Care at [facility], [place], MD   11 months ago Type 2 diabetes mellitus with other specified complication, with long-term current use of insulin [facility])   Primary Care at [place], [place], MD   1 year ago Type 2 diabetes mellitus with hyperglycemia, unspecified whether long term insulin use [facility])   Primary Care at [facility], Ines Bloomer, MD   1 year ago Asymptomatic hypertension   Primary Care at [place], Audrie Lia, PA-C      Future Appointments            In 1 month [place], Ines Bloomer, MD Primary Care at [place], [facility]

## 2019-11-16 ENCOUNTER — Other Ambulatory Visit: Payer: Self-pay | Admitting: Emergency Medicine

## 2019-11-16 DIAGNOSIS — I10 Essential (primary) hypertension: Secondary | ICD-10-CM

## 2019-11-16 NOTE — Telephone Encounter (Signed)
Requested Prescriptions  Pending Prescriptions Disp Refills  . lisinopril-hydrochlorothiazide (ZESTORETIC) 20-25 MG tablet [Pharmacy Med Name: LISINOPRIL-HCTZ 20-25 MG TAB] 90 tablet 3    Sig: TAKE 1 TABLET BY MOUTH EVERY DAY     Cardiovascular:  ACEI + Diuretic Combos Failed - 11/16/2019  7:59 AM      Failed - Na in normal range and within 180 days    Sodium  Date Value Ref Range Status  09/26/2019 146 (H) 134 - 144 mmol/L Final  05/28/2009 143 128 - 145 mEq/L Final         Failed - Cr in normal range and within 180 days    Creat  Date Value Ref Range Status  12/31/2015 1.32 0.70 - 1.33 mg/dL Final   Creatinine, Ser  Date Value Ref Range Status  09/26/2019 1.60 (H) 0.76 - 1.27 mg/dL Final         Failed - Last BP in normal range    BP Readings from Last 1 Encounters:  09/26/19 (!) 143/89         Passed - K in normal range and within 180 days    Potassium  Date Value Ref Range Status  09/26/2019 3.8 3.5 - 5.2 mmol/L Final  05/28/2009 4.0 3.3 - 4.7 mEq/L Final         Passed - Ca in normal range and within 180 days    Calcium  Date Value Ref Range Status  09/26/2019 9.6 8.6 - 10.2 mg/dL Final  05/28/2009 9.3 8.0 - 10.3 mg/dL Final         Passed - Patient is not pregnant      Passed - Valid encounter within last 6 months    Recent Outpatient Visits          1 month ago Hypertension associated with type 2 diabetes mellitus ([place])   Primary Care at [place], [place], MD   11 months ago Type 2 diabetes mellitus with other specified complication, with long-term current use of insulin [facility])   Primary Care at [place], [place], MD   11 months ago Type 2 diabetes mellitus with other specified complication, with long-term current use of insulin [facility])   Primary Care at [place], [place], MD   1 year ago Type 2 diabetes mellitus with hyperglycemia, unspecified whether long term insulin use [facility])   Primary Care at [facility], Ines Bloomer,  MD   1 year ago Asymptomatic hypertension   Primary Care at [place] [facility], Audrie Lia, PA-C      Future Appointments            In 1 month [place], Ines Bloomer, MD Primary Care at [place], [facility]

## 2019-11-23 ENCOUNTER — Telehealth: Payer: Self-pay | Admitting: Emergency Medicine

## 2019-11-23 NOTE — Telephone Encounter (Signed)
He should get a prescription for 2 vials instead so he can save money.  Thanks.

## 2019-11-23 NOTE — Telephone Encounter (Signed)
Patient is taking insulin glargine (Lantus) 100 UNIT/ML injection the single vile and wanted to know if you could write a prescription for him to get two viles at a time to see if he can use the discount card because its more expensive to get 1 at a time , and he's paying out of pocket. Please Advise.

## 2019-11-23 NOTE — Telephone Encounter (Signed)
Called patient and informed him

## 2019-11-23 NOTE — Telephone Encounter (Signed)
What is the name of the medication? insulin glargine (LANTUS) 100 UNIT/ML injection   Have you contacted your pharmacy to request a refill? Yes, he can't afford the single vile he is paying out of pocket. He can not use a discount card. He would like [place] to put this in as a script to where he gets two viles at a time, he wants to see if his discount card will work.  Which pharmacy would you like this sent to? Pharmacy  [id number] - [place], [place]  [address] [place] [address]  Phone:  [phone] Fax:  [phone]       Patient notified that their request is being sent to the clinical staff for review and that they should receive a call once it is complete. If they do not receive a call within 72 hours they can check with their pharmacy or our office.

## 2019-11-25 NOTE — Telephone Encounter (Signed)
Pt called and stated he went to Pharmacy and they stated they could only give him one vial and they wouldn't take his discount card. Pt would like a call back regarding this. [phone] Please advise.

## 2019-11-25 NOTE — Telephone Encounter (Signed)
Message has been addressed. Pt is able to get his 2 vials of medication with coupon.

## 2019-12-12 ENCOUNTER — Other Ambulatory Visit: Payer: Self-pay | Admitting: Emergency Medicine

## 2019-12-12 DIAGNOSIS — E1165 Type 2 diabetes mellitus with hyperglycemia: Secondary | ICD-10-CM

## 2019-12-12 NOTE — Telephone Encounter (Signed)
Requested medication (s) are due for refill today: Yes  Requested medication (s) are on the active medication list: Yes  Last refill:  09/03/19  Future visit scheduled: Yes  Notes to clinic:  Pharmacy asking for diagnosis code.    Requested Prescriptions  Pending Prescriptions Disp Refills   Continuous Blood Gluc Sensor (FREESTYLE LIBRE 14 DAY SENSOR) MISC [Pharmacy Med Name: FREESTYLE LIBRE 14 DAY SENSOR]  5    Sig: USE AS INDICATED TO CHECK BLOOD GLUCOSE FOR 87 DAYS      Endocrinology: Diabetes - Testing Supplies Passed - 12/12/2019 11:09 AM      Passed - Valid encounter within last 12 months    Recent Outpatient Visits           2 months ago Hypertension associated with type 2 diabetes mellitus [facility])   Primary Care at [facility], [place], MD   11 months ago Type 2 diabetes mellitus with other specified complication, with long-term current use of insulin [facility])   Primary Care at [place], [place], MD   1 year ago Type 2 diabetes mellitus with other specified complication, with long-term current use of insulin [facility])   Primary Care at [place], [place], MD   1 year ago Type 2 diabetes mellitus with hyperglycemia, unspecified whether long term insulin use [facility])   Primary Care at [facility], Ines Bloomer, MD   1 year ago Asymptomatic hypertension   Primary Care at [place], Audrie Lia, PA-C       Future Appointments             In 2 weeks [place], Ines Bloomer, MD Primary Care at [place], [facility]

## 2019-12-27 ENCOUNTER — Encounter: Payer: Self-pay | Admitting: Emergency Medicine

## 2019-12-27 ENCOUNTER — Other Ambulatory Visit: Payer: Self-pay

## 2019-12-27 ENCOUNTER — Ambulatory Visit (INDEPENDENT_AMBULATORY_CARE_PROVIDER_SITE_OTHER): Payer: Self-pay | Admitting: Emergency Medicine

## 2019-12-27 VITALS — BP 135/86 | HR 86 | Temp 98.2°F | Ht 66.0 in | Wt 273.0 lb

## 2019-12-27 DIAGNOSIS — L602 Onychogryphosis: Secondary | ICD-10-CM

## 2019-12-27 DIAGNOSIS — E1142 Type 2 diabetes mellitus with diabetic polyneuropathy: Secondary | ICD-10-CM

## 2019-12-27 DIAGNOSIS — E1159 Type 2 diabetes mellitus with other circulatory complications: Secondary | ICD-10-CM

## 2019-12-27 DIAGNOSIS — I10 Essential (primary) hypertension: Secondary | ICD-10-CM

## 2019-12-27 DIAGNOSIS — N1831 Chronic kidney disease, stage 3a: Secondary | ICD-10-CM

## 2019-12-27 DIAGNOSIS — E785 Hyperlipidemia, unspecified: Secondary | ICD-10-CM

## 2019-12-27 DIAGNOSIS — R21 Rash and other nonspecific skin eruption: Secondary | ICD-10-CM

## 2019-12-27 DIAGNOSIS — E1169 Type 2 diabetes mellitus with other specified complication: Secondary | ICD-10-CM

## 2019-12-27 MED ORDER — INSULIN GLARGINE 100 UNITS/ML SOLOSTAR PEN: 45.0000 [IU] | PEN_INJECTOR | Freq: Every day | SUBCUTANEOUS | 11 refills | Status: DC

## 2019-12-27 NOTE — Progress Notes (Signed)
Alexander Cochran 64 y.o.   Chief Complaint  Patient presents with   Diabetes   Hypertension    HISTORY OF PRESENT ILLNESS: This is a 64 y.o. male with history of diabetes and hypertension here for follow-up. Lab Results  Component Value Date   HGBA1C 6.6 (A) 09/26/2019   BP Readings from Last 3 Encounters:  12/27/19 135/86  09/26/19 (!) 143/89  09/14/18 113/68  Complaining of extremely long toenails and rash on his feet for several weeks. No other complaints or medical concerns today.   HPI   Prior to Admission medications   Medication Sig Start Date End Date Taking? Authorizing Provider  BD INSULIN SYRINGE U/F 30G X 1/2" 0.5 ML MISC INJECT 45 UNITS INTO THE SKIN DAILY 09/28/19  Yes Fisher Hargadon, Ines Bloomer, MD  Continuous Blood Gluc Sensor (FREESTYLE LIBRE 14 DAY SENSOR) MISC USE AS INDICATED TO CHECK BLOOD GLUCOSE FOR 14 DAYS 12/12/19  Yes Daqwan Dougal, Ines Bloomer, MD  gabapentin (NEURONTIN) 300 MG capsule TAKE 1 CAPSULE BY MOUTH TWICE A DAY 10/18/19  Yes Damon Baisch, Ines Bloomer, MD  insulin glargine (LANTUS) 100 UNIT/ML injection INJECT 45 UNITS INTO THE SKIN DAILY IN THE EVENING 09/26/19  Yes Keyle Doby, Ines Bloomer, MD  lisinopril-hydrochlorothiazide (ZESTORETIC) 20-25 MG tablet TAKE 1 TABLET BY MOUTH EVERY DAY 11/16/19  Yes [name], MD  simvastatin (ZOCOR) 40 MG tablet TAKE 1 TABLET BY MOUTH EVERY DAY 11/01/19  Yes Toluwanimi Radebaugh, Ines Bloomer, MD  pantoprazole (PROTONIX) 40 MG tablet Take 40 mg by mouth daily.    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00    Pack years: 0.50    Quit date: 12/28/1973    Years since quitting: 46.0   Smokeless tobacco: Never Used  Substance and Sexual Activity   Alcohol use: Yes   Drug use: No   Sexual activity: Never  Other Topics Concern   Not on file  Social History Narrative   Not on file   Social Determinants of Health   Financial Resource Strain:    Difficulty of Paying Living Expenses:   Food Insecurity:    Worried About Charity fundraiser in the Last Year:    Arboriculturist in the Last Year:   Transportation Needs:    Film/video editor (Medical):    Lack of Transportation (Non-Medical):   Physical Activity:    Days of Exercise per Week:  Minutes of Exercise per Session:   Stress:    Feeling of Stress :   Social Connections:    Frequency of Communication with Friends and Family:    Frequency of Social Gatherings with Friends and Family:    Attends Religious Services:    Active Member of Clubs or Organizations:    Attends Music therapist:    Marital Status:   Intimate Partner Violence:    Fear of Current or Ex-Partner:    Emotionally Abused:    Physically Abused:    Sexually Abused:     Family History  Problem Relation Age of Onset   Hypertension Sister    Hypertension Brother      Review of Systems  Constitutional: Negative.  Negative for chills  and fever.  HENT: Negative.  Negative for congestion and sore throat.   Respiratory: Negative.  Negative for cough and shortness of breath.   Cardiovascular: Negative.  Negative for chest pain and palpitations.  Gastrointestinal: Negative for abdominal pain, blood in stool, diarrhea, melena, nausea and vomiting.  Genitourinary: Negative.  Negative for dysuria and hematuria.  Skin: Positive for rash (Feet).  Neurological: Negative for dizziness and headaches.  All other systems reviewed and are negative.       Today's Vitals   12/27/19 1027  BP: 135/86  Pulse: 86  Temp: 98.2 F (36.8 C)  SpO2: 97%  Weight: 273 lb (123.8 kg)  Height: 5\' 6"  (1.676 m)   Body mass index is 44.06 kg/m.  Physical Exam Vitals reviewed.  Constitutional:      Appearance: Normal appearance.  HENT:     Head: Normocephalic.  Eyes:     Extraocular Movements: Extraocular movements intact.     Pupils: Pupils are equal, round, and reactive to light.  Cardiovascular:     Rate and Rhythm: Normal rate.  Pulmonary:     Effort: Pulmonary effort is normal.  Musculoskeletal:     Cervical back: Normal range of motion.     Comments: Feet: Warm to touch.  Good distal circulation.  Excellent capillary refill.  See pictures  Skin:    General: Skin is warm and dry.     Capillary Refill: Capillary refill takes less than 2 seconds.  Neurological:     General: No focal deficit present.     Mental Status: He is alert and oriented to person, place, and time.  Psychiatric:        Mood and Affect: Mood normal.        Behavior: Behavior normal.      ASSESSMENT & PLAN:   Alexander Cochran was seen today for diabetes and hypertension.  Diagnoses and all orders for this visit:  Diabetic polyneuropathy associated with type 2 diabetes mellitus ([place]) -     Cancel: POCT glycosylated hemoglobin (Hb A1C) -     HM Diabetes Foot Exam  Hypertension associated with type 2 diabetes mellitus ([facility]) -     insulin glargine  (LANTUS) 100 unit/mL SOPN; Inject 0.45 mLs (45 Units total) into the skin daily.  Dyslipidemia associated with type 2 diabetes mellitus ([facility])  Stage 3a chronic kidney disease  Nail hypertrophy -     Ambulatory referral to Podiatry  Rash and nonspecific skin eruption Comments: Feet Orders: -     Ambulatory referral to Podiatry    Patient Instructions       If you have lab work done today you will be contacted with your lab results within the next 2 weeks.  If  you have not heard from [place] then please contact us. The fastest way to get your results is to register for My Chart.   IF you received an x-ray today, you will receive an invoice from [facility]. Please contact [facility] at [phone] with questions or concerns regarding your invoice.   IF you received labwork today, you will receive an invoice from [facility]. Please contact [facility] at [phone] with questions or concerns regarding your invoice.   Our billing staff will not be able to assist you with questions regarding bills from these companies.  You will be contacted with the lab results as soon as they are available. The fastest way to get your results is to activate your My Chart account. Instructions are located on the last page of this paperwork. If you have not heard from [place] regarding the results in 2 weeks, please contact this office.       Diabetes Mellitus and Nutrition, Adult When you have diabetes (diabetes mellitus), it is very important to have healthy eating habits because your blood sugar (glucose) levels are greatly affected by what you eat and drink. Eating healthy foods in the appropriate amounts, at about the same times every day, can help you:  Control your blood glucose.  Lower your risk of heart disease.  Improve your blood pressure.  Reach or maintain a healthy weight. Every person with diabetes is different, and each person has different needs for a meal plan. Your  health care provider may recommend that you work with a diet and nutrition specialist (dietitian) to make a meal plan that is best for you. Your meal plan may vary depending on factors such as:  The calories you need.  The medicines you take.  Your weight.  Your blood glucose, blood pressure, and cholesterol levels.  Your activity level.  Other health conditions you have, such as heart or kidney disease. How do carbohydrates affect me? Carbohydrates, also called carbs, affect your blood glucose level more than any other type of food. Eating carbs naturally raises the amount of glucose in your blood. Carb counting is a method for keeping track of how many carbs you eat. Counting carbs is important to keep your blood glucose at a healthy level, especially if you use insulin or take certain oral diabetes medicines. It is important to know how many carbs you can safely have in each meal. This is different for every person. Your dietitian can help you calculate how many carbs you should have at each meal and for each snack. Foods that contain carbs include:  Bread, cereal, rice, pasta, and crackers.  Potatoes and corn.  Peas, beans, and lentils.  Milk and yogurt.  Fruit and juice.  Desserts, such as cakes, cookies, ice cream, and candy. How does alcohol affect me? Alcohol can cause a sudden decrease in blood glucose (hypoglycemia), especially if you use insulin or take certain oral diabetes medicines. Hypoglycemia can be a life-threatening condition. Symptoms of hypoglycemia (sleepiness, dizziness, and confusion) are similar to symptoms of having too much alcohol. If your health care provider says that alcohol is safe for you, follow these guidelines:  Limit alcohol intake to no more than 1 drink per day for nonpregnant women and 2 drinks per day for men. One drink equals 12 oz of beer, 5 oz of wine, or 1 oz of hard liquor.  Do not drink on an empty stomach.  Keep yourself hydrated  with water, diet soda, or unsweetened iced tea.  Keep  in mind that regular soda, juice, and other mixers may contain a lot of sugar and must be counted as carbs. What are tips for following this plan?  Reading food labels  Start by checking the serving size on the "Nutrition Facts" label of packaged foods and drinks. The amount of calories, carbs, fats, and other nutrients listed on the label is based on one serving of the item. Many items contain more than one serving per package.  Check the total grams (g) of carbs in one serving. You can calculate the number of servings of carbs in one serving by dividing the total carbs by 15. For example, if a food has 30 g of total carbs, it would be equal to 2 servings of carbs.  Check the number of grams (g) of saturated and trans fats in one serving. Choose foods that have low or no amount of these fats.  Check the number of milligrams (mg) of salt (sodium) in one serving. Most people should limit total sodium intake to less than 2,300 mg per day.  Always check the nutrition information of foods labeled as "low-fat" or "nonfat". These foods may be higher in added sugar or refined carbs and should be avoided.  Talk to your dietitian to identify your daily goals for nutrients listed on the label. Shopping  Avoid buying canned, premade, or processed foods. These foods tend to be high in fat, sodium, and added sugar.  Shop around the outside edge of the grocery store. This includes fresh fruits and vegetables, bulk grains, fresh meats, and fresh dairy. Cooking  Use low-heat cooking methods, such as baking, instead of high-heat cooking methods like deep frying.  Cook using healthy oils, such as olive, canola, or sunflower oil.  Avoid cooking with butter, cream, or high-fat meats. Meal planning  Eat meals and snacks regularly, preferably at the same times every day. Avoid going long periods of time without eating.  Eat foods high in fiber, such  as fresh fruits, vegetables, beans, and whole grains. Talk to your dietitian about how many servings of carbs you can eat at each meal.  Eat 4-6 ounces (oz) of lean protein each day, such as lean meat, chicken, fish, eggs, or tofu. One oz of lean protein is equal to: ? 1 oz of meat, chicken, or fish. ? 1 egg. ?  cup of tofu.  Eat some foods each day that contain healthy fats, such as avocado, nuts, seeds, and fish. Lifestyle  Check your blood glucose regularly.  Exercise regularly as told by your health care provider. This may include: ? 150 minutes of moderate-intensity or vigorous-intensity exercise each week. This could be brisk walking, biking, or water aerobics. ? Stretching and doing strength exercises, such as yoga or weightlifting, at least 2 times a week.  Take medicines as told by your health care provider.  Do not use any products that contain nicotine or tobacco, such as cigarettes and e-cigarettes. If you need help quitting, ask your health care provider.  Work with a Social worker or diabetes educator to identify strategies to manage stress and any emotional and social challenges. Questions to ask a health care provider  Do I need to meet with a diabetes educator?  Do I need to meet with a dietitian?  What number can I call if I have questions?  When are the best times to check my blood glucose? Where to find more information:  American Diabetes Association: diabetes.org  Academy of Nutrition and Dietetics: www.eatright.org  [employer] of Diabetes and Digestive and Kidney Diseases (NIH): [employer] Summary  A healthy meal plan will help you control your blood glucose and maintain a healthy lifestyle.  Working with a diet and nutrition specialist (dietitian) can help you make a meal plan that is best for you.  Keep in mind that carbohydrates (carbs) and alcohol have immediate effects on your blood glucose levels. It is important to count carbs and  to use alcohol carefully. This information is not intended to replace advice given to you by your health care provider. Make sure you discuss any questions you have with your health care provider. Document Revised: 06/26/2017 Document Reviewed: 08/18/2016 Elsevier Patient Education  2020 Elsevier Inc.      Agustina Caroli, MD [facility]

## 2019-12-27 NOTE — Patient Instructions (Addendum)
   If you have lab work done today you will be contacted with your lab results within the next 2 weeks.  If you have not heard from us then please contact us. The fastest way to get your results is to register for My Chart.   IF you received an x-ray today, you will receive an invoice from [facility]. Please contact [facility] at [phone] with questions or concerns regarding your invoice.   IF you received labwork today, you will receive an invoice from [facility]. Please contact [facility] at [phone] with questions or concerns regarding your invoice.   Our billing staff will not be able to assist you with questions regarding bills from these companies.  You will be contacted with the lab results as soon as they are available. The fastest way to get your results is to activate your My Chart account. Instructions are located on the last page of this paperwork. If you have not heard from us regarding the results in 2 weeks, please contact this office.      Diabetes Mellitus and Nutrition, Adult When you have diabetes (diabetes mellitus), it is very important to have healthy eating habits because your blood sugar (glucose) levels are greatly affected by what you eat and drink. Eating healthy foods in the appropriate amounts, at about the same times every day, can help you:  Control your blood glucose.  Lower your risk of heart disease.  Improve your blood pressure.  Reach or maintain a healthy weight. Every person with diabetes is different, and each person has different needs for a meal plan. Your health care provider may recommend that you work with a diet and nutrition specialist (dietitian) to make a meal plan that is best for you. Your meal plan may vary depending on factors such as:  The calories you need.  The medicines you take.  Your weight.  Your blood glucose, blood pressure, and cholesterol levels.  Your activity level.  Other health  conditions you have, such as heart or kidney disease. How do carbohydrates affect me? Carbohydrates, also called carbs, affect your blood glucose level more than any other type of food. Eating carbs naturally raises the amount of glucose in your blood. Carb counting is a method for keeping track of how many carbs you eat. Counting carbs is important to keep your blood glucose at a healthy level, especially if you use insulin or take certain oral diabetes medicines. It is important to know how many carbs you can safely have in each meal. This is different for every person. Your dietitian can help you calculate how many carbs you should have at each meal and for each snack. Foods that contain carbs include:  Bread, cereal, rice, pasta, and crackers.  Potatoes and corn.  Peas, beans, and lentils.  Milk and yogurt.  Fruit and juice.  Desserts, such as cakes, cookies, ice cream, and candy. How does alcohol affect me? Alcohol can cause a sudden decrease in blood glucose (hypoglycemia), especially if you use insulin or take certain oral diabetes medicines. Hypoglycemia can be a life-threatening condition. Symptoms of hypoglycemia (sleepiness, dizziness, and confusion) are similar to symptoms of having too much alcohol. If your health care provider says that alcohol is safe for you, follow these guidelines:  Limit alcohol intake to no more than 1 drink per day for nonpregnant women and 2 drinks per day for men. One drink equals 12 oz of beer, 5 oz of wine, or 1 oz of hard   liquor.  Do not drink on an empty stomach.  Keep yourself hydrated with water, diet soda, or unsweetened iced tea.  Keep in mind that regular soda, juice, and other mixers may contain a lot of sugar and must be counted as carbs. What are tips for following this plan?  Reading food labels  Start by checking the serving size on the "Nutrition Facts" label of packaged foods and drinks. The amount of calories, carbs, fats, and  other nutrients listed on the label is based on one serving of the item. Many items contain more than one serving per package.  Check the total grams (g) of carbs in one serving. You can calculate the number of servings of carbs in one serving by dividing the total carbs by 15. For example, if a food has 30 g of total carbs, it would be equal to 2 servings of carbs.  Check the number of grams (g) of saturated and trans fats in one serving. Choose foods that have low or no amount of these fats.  Check the number of milligrams (mg) of salt (sodium) in one serving. Most people should limit total sodium intake to less than 2,300 mg per day.  Always check the nutrition information of foods labeled as "low-fat" or "nonfat". These foods may be higher in added sugar or refined carbs and should be avoided.  Talk to your dietitian to identify your daily goals for nutrients listed on the label. Shopping  Avoid buying canned, premade, or processed foods. These foods tend to be high in fat, sodium, and added sugar.  Shop around the outside edge of the grocery store. This includes fresh fruits and vegetables, bulk grains, fresh meats, and fresh dairy. Cooking  Use low-heat cooking methods, such as baking, instead of high-heat cooking methods like deep frying.  Cook using healthy oils, such as olive, canola, or sunflower oil.  Avoid cooking with butter, cream, or high-fat meats. Meal planning  Eat meals and snacks regularly, preferably at the same times every day. Avoid going long periods of time without eating.  Eat foods high in fiber, such as fresh fruits, vegetables, beans, and whole grains. Talk to your dietitian about how many servings of carbs you can eat at each meal.  Eat 4-6 ounces (oz) of lean protein each day, such as lean meat, chicken, fish, eggs, or tofu. One oz of lean protein is equal to: ? 1 oz of meat, chicken, or fish. ? 1 egg. ?  cup of tofu.  Eat some foods each day that  contain healthy fats, such as avocado, nuts, seeds, and fish. Lifestyle  Check your blood glucose regularly.  Exercise regularly as told by your health care provider. This may include: ? 150 minutes of moderate-intensity or vigorous-intensity exercise each week. This could be brisk walking, biking, or water aerobics. ? Stretching and doing strength exercises, such as yoga or weightlifting, at least 2 times a week.  Take medicines as told by your health care provider.  Do not use any products that contain nicotine or tobacco, such as cigarettes and e-cigarettes. If you need help quitting, ask your health care provider.  Work with a counselor or diabetes educator to identify strategies to manage stress and any emotional and social challenges. Questions to ask a health care provider  Do I need to meet with a diabetes educator?  Do I need to meet with a dietitian?  What number can I call if I have questions?  When are the best   best times to check my blood glucose? Where to find more information:  American Diabetes Association: diabetes.org  Academy of Nutrition and Dietetics: www.eatright.[employer] of Diabetes and Digestive and Kidney Diseases (NIH): [employer] Summary  A healthy meal plan will help you control your blood glucose and maintain a healthy lifestyle.  Working with a diet and nutrition specialist (dietitian) can help you make a meal plan that is best for you.  Keep in mind that carbohydrates (carbs) and alcohol have immediate effects on your blood glucose levels. It is important to count carbs and to use alcohol carefully. This information is not intended to replace advice given to you by your health care provider. Make sure you discuss any questions you have with your health care provider. Document Revised: 06/26/2017 Document Reviewed: 08/18/2016 Elsevier Patient Education  2020 [employer].

## 2020-01-03 ENCOUNTER — Telehealth: Payer: Self-pay | Admitting: Emergency Medicine

## 2020-01-03 ENCOUNTER — Other Ambulatory Visit: Payer: Self-pay

## 2020-01-03 MED ORDER — INSULIN PEN NEEDLE 31G X 8 MM MISC: 1.0000 | Freq: Every day | 0 refills | Status: DC

## 2020-01-03 NOTE — Telephone Encounter (Signed)
Pt called needs an rx sent in for pen needles for his new pen.   Would like this to be sent to [facility]  at [address]

## 2020-01-03 NOTE — Telephone Encounter (Signed)
Send the prescription that he needs please.  Thanks.

## 2020-01-03 NOTE — Telephone Encounter (Signed)
Pt walked out with paper script for pen/ pharmacy said he needed to order the needles as well  . Pt was under the understanding that needles came with pen.   [facility] uses [id number] - [place], [place]  [address] [place] [address]  Phone:  [phone] Fax:  [phone]  DEA #:  [id number]   Please advise

## 2020-01-04 ENCOUNTER — Other Ambulatory Visit: Payer: Self-pay

## 2020-01-04 ENCOUNTER — Encounter: Payer: Self-pay | Admitting: Sports Medicine

## 2020-01-04 ENCOUNTER — Ambulatory Visit (INDEPENDENT_AMBULATORY_CARE_PROVIDER_SITE_OTHER): Payer: Self-pay | Admitting: Sports Medicine

## 2020-01-04 DIAGNOSIS — M79674 Pain in right toe(s): Secondary | ICD-10-CM

## 2020-01-04 DIAGNOSIS — M79675 Pain in left toe(s): Secondary | ICD-10-CM

## 2020-01-04 DIAGNOSIS — B359 Dermatophytosis, unspecified: Secondary | ICD-10-CM

## 2020-01-04 DIAGNOSIS — E1165 Type 2 diabetes mellitus with hyperglycemia: Secondary | ICD-10-CM

## 2020-01-04 DIAGNOSIS — B351 Tinea unguium: Secondary | ICD-10-CM

## 2020-01-04 NOTE — Patient Instructions (Signed)
Get OTC Lotrimin spray to spray in between toes at bedtime

## 2020-01-04 NOTE — Progress Notes (Signed)
Subjective: Alexander Cochran is a 64 y.o. male patient with history of diabetes who presents to office today complaining of long,mildly painful nails  while ambulating in shoes; unable to trim. Patient states that the glucose reading this morning was 90mg /dl, A1C 6. Patient denies any new changes in medication or new problems. Patient admits history of neuropathy on Gabapentin. No other issues noted.  Patient Active Problem List   Diagnosis Date Noted   Diabetic polyneuropathy associated with type 2 diabetes mellitus ([place]) 09/26/2019   Type 2 diabetes mellitus with hyperglycemia ([place]) 09/26/2019   Dyslipidemia associated with type 2 diabetes mellitus ([place]) 09/26/2019   Stage 3a chronic kidney disease 09/26/2019   Hyperosmolar hyperglycemic coma due to diabetes mellitus without ketoacidosis ([place]) 08/30/2018   Left renal mass 08/30/2018   Mass of thyroid region 08/30/2018   Type 2 MI (myocardial infarction) ([place]) 08/30/2018   Acute on chronic kidney failure ([place]) 08/28/2018   Diabetic ketoacidosis without coma associated with type 2 diabetes mellitus ([place]) 08/28/2018   Morbid obesity ([place]) 08/28/2018   Severe acute pancreatitis 08/28/2018   Shock ([place]) 08/28/2018   BMI 38.0-38.9,adult 10/21/2011   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[place]) 10/21/2011   Hyperlipemia 10/21/2011   Hypertension associated with type 2 diabetes mellitus ([place]) 10/21/2011   Current Outpatient Medications on File Prior to Visit  Medication Sig Dispense Refill   BD INSULIN SYRINGE U/F 30G X 1/2" 0.5 ML MISC INJECT 45 UNITS INTO THE SKIN DAILY 100 each 5   Continuous Blood Gluc Sensor (FREESTYLE LIBRE 14 DAY SENSOR) MISC USE AS INDICATED TO CHECK BLOOD GLUCOSE FOR 14 DAYS 2 each 5   gabapentin (NEURONTIN) 300 MG capsule TAKE 1 CAPSULE BY MOUTH TWICE A DAY 180 capsule 3   insulin glargine (LANTUS) 100 unit/mL SOPN Inject 0.45 mLs (45 Units total) into the skin daily. 15 mL 11   Insulin Pen Needle 31G X 8 MM  MISC 1 each by Does not apply route daily. 100 each 0   lisinopril-hydrochlorothiazide (ZESTORETIC) 20-25 MG tablet TAKE 1 TABLET BY MOUTH EVERY DAY 90 tablet 3   simvastatin (ZOCOR) 40 MG tablet TAKE 1 TABLET BY MOUTH EVERY DAY 90 tablet 0   No current facility-administered medications on file prior to visit.   No Known Allergies  No results found for this or any previous visit (from the past 2160 hour(s)).  Objective: General: Patient is awake, alert, and oriented x 3 and in no acute distress.  Integument: Skin is warm, dry and supple bilateral. Nails are tender, extremely long, thickened and dystrophic with subungual debris, consistent with onychomycosis, 1-5 bilateral. No signs of infection.  Dry scaly skin to heels and discoloration to the dorsal surfaces of both feet as well as the lateral surfaces consistent with hyperpigmented keratosis.  No open lesions or preulcerative lesions present bilateral. Remaining integument unremarkable.  Vasculature:  Dorsalis Pedis pulse 1/4 bilateral. Posterior Tibial pulse 1 /4 bilateral. Capillary fill time <5 sec 1-5 bilateral. scant hair growth to the level of the digits.Temperature gradient within normal limits. No varicosities present bilateral. No edema present bilateral.   Neurology: The patient has diminished sensation measured with a 5.07/10g Semmes Weinstein Monofilament at all pedal sites bilateral . Vibratory sensation diminished bilateral with tuning fork. No Babinski sign present bilateral.   Musculoskeletal:Asymptomatic pes planus pedal deformities noted bilateral. Muscular strength 5/5 in all lower extremity muscular groups bilateral without pain on range of motion . No tenderness with calf compression bilateral.  Assessment and Plan: Problem  List Items Addressed This Visit      Endocrine   Type 2 diabetes mellitus with hyperglycemia ([place])    Other Visit Diagnoses    Pain due to onychomycosis of toenails of both feet    -  Primary    Tinea          -Examined patient. -Discussed and educated patient on diabetic foot care, especially with  regards to the vascular, neurological and musculoskeletal systems.  -Stressed the importance of good glycemic control and the detriment of not  controlling glucose levels in relation to the foot. -Mechanically debrided all nails 1-5 bilateral using sterile nail nipper and filed with dremel without incident  -Advised patient to use foot miracle cream to dry skin on heels and to get over-the-counter Lotrimin spray to use in between his toes to the areas of skin discoloration as well as on the lateral side of both feet -Answered all patient questions -Patient to return  in 3 months for at risk foot care -Patient advised to call the office if any problems or questions arise in the meantime.  Landis Martins, DPM

## 2020-01-19 ENCOUNTER — Other Ambulatory Visit: Payer: Self-pay

## 2020-01-19 ENCOUNTER — Other Ambulatory Visit: Payer: Self-pay | Admitting: Emergency Medicine

## 2020-01-19 ENCOUNTER — Encounter: Payer: Self-pay | Admitting: Endocrinology

## 2020-01-19 ENCOUNTER — Ambulatory Visit: Payer: Medicaid Other | Admitting: Endocrinology

## 2020-01-19 VITALS — BP 140/80 | HR 90 | Ht 66.0 in | Wt 276.8 lb

## 2020-01-19 DIAGNOSIS — I152 Hypertension secondary to endocrine disorders: Secondary | ICD-10-CM

## 2020-01-19 DIAGNOSIS — I10 Essential (primary) hypertension: Secondary | ICD-10-CM

## 2020-01-19 DIAGNOSIS — E119 Type 2 diabetes mellitus without complications: Secondary | ICD-10-CM

## 2020-01-19 DIAGNOSIS — R221 Localized swelling, mass and lump, neck: Secondary | ICD-10-CM

## 2020-01-19 DIAGNOSIS — E1159 Type 2 diabetes mellitus with other circulatory complications: Secondary | ICD-10-CM

## 2020-01-19 DIAGNOSIS — E785 Hyperlipidemia, unspecified: Secondary | ICD-10-CM

## 2020-01-19 LAB — POCT GLYCOSYLATED HEMOGLOBIN (HGB A1C): Hemoglobin A1C: 5.9 % — AB (ref 4.0–5.6)

## 2020-01-19 LAB — TSH: TSH: 1.05 u[IU]/mL (ref 0.35–4.50)

## 2020-01-19 MED ORDER — FREESTYLE LIBRE 14 DAY SENSOR MISC: 1.0000 | 2 refills | Status: DC

## 2020-01-19 MED ORDER — INSULIN GLARGINE 100 UNITS/ML SOLOSTAR PEN: 35.0000 [IU] | PEN_INJECTOR | SUBCUTANEOUS | 11 refills | Status: DC

## 2020-01-19 NOTE — Progress Notes (Signed)
Subjective:    Patient ID: Alexander Cochran, male    DOB: October 02, 1955, 64 y.o.   MRN: [id number]  HPI pt is referred by Dr. Mitchel Honour, for diabetes.  DM was dx'ed in 2020, when he presented with DKA; it is complicated by renal insuff; he has been on insulin since dx; pt says his diet and exercise are fair; he has never had pancreatic surgery, or severe hypoglycemia.   I reviewed continuous glucose monitor data.  Glucose varies from 45-210.  It is in general highest after breakfast. Past Medical History:  Diagnosis Date  . Hyperlipidemia   . Hypertension     Past Surgical History:  Procedure Laterality Date  . CYST EXCISION PERINEAL  1984    Social History   Socioeconomic History  . Marital status: Married    Spouse name: Not on file  . Number of children: Not on file  . Years of education: Not on file  . Highest education level: Not on file  Occupational History  . Not on file  Tobacco Use  . Smoking status: Former Smoker    Packs/day: 0.25    Years: 2.00    Pack years: 0.50    Quit date: 12/28/1973    Years since quitting: 46.0  . Smokeless tobacco: Never Used  Substance and Sexual Activity  . Alcohol use: Yes  . Drug use: No  . Sexual activity: Never  Other Topics Concern  . Not on file  Social History Narrative  . Not on file   Social Determinants of Health   Financial Resource Strain:   . Difficulty of Paying Living Expenses:   Food Insecurity:   . Worried About Charity fundraiser in the Last Year:   . Arboriculturist in the Last Year:   Transportation Needs:   . Film/video editor (Medical):   Marland Kitchen Lack of Transportation (Non-Medical):   Physical Activity:   . Days of Exercise per Week:   . Minutes of Exercise per Session:   Stress:   . Feeling of Stress :   Social Connections:   . Frequency of Communication with Friends and Family:   . Frequency of Social Gatherings with Friends and Family:   . Attends Religious Services:   . Active Member of Clubs  or Organizations:   . Attends Archivist Meetings:   Marland Kitchen Marital Status:   Intimate Partner Violence:   . Fear of Current or Ex-Partner:   . Emotionally Abused:   Marland Kitchen Physically Abused:   . Sexually Abused:     Current Outpatient Medications on File Prior to Visit  Medication Sig Dispense Refill  . gabapentin (NEURONTIN) 300 MG capsule TAKE 1 CAPSULE BY MOUTH TWICE A DAY 180 capsule 3  . Insulin Pen Needle 31G X 8 MM MISC 1 each by Does not apply route daily. 100 each 0  . lisinopril-hydrochlorothiazide (ZESTORETIC) 20-25 MG tablet TAKE 1 TABLET BY MOUTH EVERY DAY 90 tablet 3   No current facility-administered medications on file prior to visit.    No Known Allergies  Family History  Problem Relation Age of Onset  . Diabetes Mother   . Hypertension Sister   . Hypertension Brother     BP 140/80   Pulse 90   Ht 5\' 6"  (1.676 m)   Wt 276 lb 12.8 oz (125.6 kg)   SpO2 98%   BMI 44.68 kg/m     Review of Systems denies weight loss, blurry vision, chest pain,  sob, n/v, urinary frequency, memory loss, and depression.      Objective:   Physical Exam VS: see vs page GEN: no distress HEAD: head: no deformity eyes: no periorbital swelling, no proptosis external nose and ears are normal NECK: supple, thyroid is enlarged, but I can't tell details CHEST WALL: no deformity LUNGS: clear to auscultation CV: reg rate and rhythm, no murmur MUSCULOSKELETAL: muscle bulk and strength are grossly normal.  no obvious joint swelling.  gait is slow but steady (does not have his caner with him today).   EXTEMITIES: no deformity.  no ulcer on the feet.  feet are of normal color and temp.  1+ bilat leg edema.  There is hyperpigmentation and shin thickening at the intertrigenous areas of the toes.  there is bilateral onychomycosis of the toenails PULSES: dorsalis pedis intact bilat.  no carotid bruit NEURO:  cn 2-12 grossly intact.   readily moves all 4's.  sensation is intact to touch  on the feet SKIN:  Normal texture and temperature.  No rash or suspicious lesion is visible.   NODES:  None palpable at the neck PSYCH: alert, well-oriented.  Does not appear anxious nor depressed.     Lab Results  Component Value Date   CREATININE 1.60 (H) 09/26/2019   BUN 16 09/26/2019   NA 146 (H) 09/26/2019   K 3.8 09/26/2019   CL 104 09/26/2019   CO2 22 09/26/2019    Lab Results  Component Value Date   HGBA1C 5.9 (A) 01/19/2020   I have reviewed outside records, and summarized: Pt was noted to have elevated A1c, and referred here.  He was admitted 2/20 with DKA.  He also had acute pancreatitis.      Assessment & Plan:  Type 1 DM: overcontrolled.  he may be entering partial remission.  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  you will receive a phone call, about a day and time for an appointment. Blood tests are requested for you today.  We'll let you know about the results.  good diet and exercise significantly improve the control of your diabetes.  please let me know if you wish to be referred to a dietician.  high blood sugar is very risky to your health.  you should see an eye doctor and dentist every year.  It is very important to get all recommended vaccinations.  Controlling your blood pressure and cholesterol drastically reduces the damage diabetes does to your body.  Those who smoke should quit.  Please discuss these with your doctor.  Please reduce the insulin to 35 units each morning.   We will need to take this complex situation in stages.  Please come back for a follow-up appointment in 1 month.

## 2020-01-19 NOTE — Patient Instructions (Addendum)
Let's check the ultrasound.  you will receive a phone call, about a day and time for an appointment. Blood tests are requested for you today.  We'll let you know about the results.  good diet and exercise significantly improve the control of your diabetes.  please let me know if you wish to be referred to a dietician.  high blood sugar is very risky to your health.  you should see an eye doctor and dentist every year.  It is very important to get all recommended vaccinations.  Controlling your blood pressure and cholesterol drastically reduces the damage diabetes does to your body.  Those who smoke should quit.  Please discuss these with your doctor.  Please reduce the insulin to 35 units each morning.   We will need to take this complex situation in stages.  Please come back for a follow-up appointment in 1 month.

## 2020-01-20 ENCOUNTER — Telehealth: Payer: Self-pay

## 2020-01-20 NOTE — Telephone Encounter (Signed)
RESULTS  Results were reviewed by Dr. Ellison. A letter has been mailed to pt home address. For future reference, letter can be found in Epic. 

## 2020-01-20 NOTE — Telephone Encounter (Signed)
-----   Message from Renato Shin, MD sent at 01/19/2020  4:52 PM EDT ----- please contact patient: Normal--good

## 2020-02-06 ENCOUNTER — Ambulatory Visit
Admission: RE | Admit: 2020-02-06 | Discharge: 2020-02-06 | Disposition: A | Discharge status: Home / Self Care | Payer: No Typology Code available for payment source | Source: Ambulatory Visit | Attending: Endocrinology | Admitting: Endocrinology

## 2020-02-06 DIAGNOSIS — R221 Localized swelling, mass and lump, neck: Secondary | ICD-10-CM

## 2020-02-24 ENCOUNTER — Encounter: Payer: Self-pay | Admitting: Endocrinology

## 2020-02-24 ENCOUNTER — Ambulatory Visit: Payer: Self-pay | Admitting: Endocrinology

## 2020-02-24 ENCOUNTER — Other Ambulatory Visit: Payer: Self-pay

## 2020-02-24 VITALS — BP 132/80 | HR 95 | Ht 66.0 in | Wt 276.2 lb

## 2020-02-24 DIAGNOSIS — N1831 Chronic kidney disease, stage 3a: Secondary | ICD-10-CM

## 2020-02-24 DIAGNOSIS — E119 Type 2 diabetes mellitus without complications: Secondary | ICD-10-CM

## 2020-02-24 DIAGNOSIS — E1021 Type 1 diabetes mellitus with diabetic nephropathy: Secondary | ICD-10-CM

## 2020-02-24 DIAGNOSIS — E1159 Type 2 diabetes mellitus with other circulatory complications: Secondary | ICD-10-CM

## 2020-02-24 DIAGNOSIS — E1022 Type 1 diabetes mellitus with diabetic chronic kidney disease: Secondary | ICD-10-CM

## 2020-02-24 DIAGNOSIS — I152 Hypertension secondary to endocrine disorders: Secondary | ICD-10-CM

## 2020-02-24 DIAGNOSIS — N189 Chronic kidney disease, unspecified: Secondary | ICD-10-CM

## 2020-02-24 LAB — POCT GLYCOSYLATED HEMOGLOBIN (HGB A1C): Hemoglobin A1C: 6.1 % — AB (ref 4.0–5.6)

## 2020-02-24 MED ORDER — LANTUS SOLOSTAR 100 UNIT/ML ~~LOC~~ SOPN
30.0000 [IU] | PEN_INJECTOR | SUBCUTANEOUS | 99 refills | Status: DC
Start: 2020-02-24 — End: 2020-04-06

## 2020-02-24 NOTE — Progress Notes (Signed)
Subjective:    Patient ID: Alexander Cochran, male    DOB: [birth year], 64 y.o.   MRN: [id number]  HPI Pt returns for f/u of diabetes mellitus: DM type: 1 Dx'ed: 9379 Complications: CRI Therapy: insulin since GDM: never DKA: never Severe hypoglycemia: never Pancreatitis: once (2020) Pancreatic imaging: pancreatitis was seen on 2020 CT SDOH: none Other: he may be entering partial remission.  Interval history: I reviewed continuous glucose monitor data.  Glucose varies from 50-220.  It is in general lowest at 3 AM, and 11 AM.   Past Medical History:  Diagnosis Date  . Hyperlipidemia   . Hypertension     Past Surgical History:  Procedure Laterality Date  . CYST EXCISION PERINEAL  1984    Social History   Socioeconomic History  . Marital status: Married    Spouse name: Not on file  . Number of children: Not on file  . Years of education: Not on file  . Highest education level: Not on file  Occupational History  . Not on file  Tobacco Use  . Smoking status: Former Smoker    Packs/day: 0.25    Years: 2.00    Pack years: 0.50    Quit date: 12/28/1973    Years since quitting: 46.1  . Smokeless tobacco: Never Used  Substance and Sexual Activity  . Alcohol use: Yes  . Drug use: No  . Sexual activity: Never  Other Topics Concern  . Not on file  Social History Narrative  . Not on file   Social Determinants of Health   Financial Resource Strain:   . Difficulty of Paying Living Expenses:   Food Insecurity:   . Worried About Charity fundraiser in the Last Year:   . Arboriculturist in the Last Year:   Transportation Needs:   . Film/video editor (Medical):   Marland Kitchen Lack of Transportation (Non-Medical):   Physical Activity:   . Days of Exercise per Week:   . Minutes of Exercise per Session:   Stress:   . Feeling of Stress :   Social Connections:   . Frequency of Communication with Friends and Family:   . Frequency of Social Gatherings with Friends and Family:   .  Attends Religious Services:   . Active Member of Clubs or Organizations:   . Attends Archivist Meetings:   Marland Kitchen Marital Status:   Intimate Partner Violence:   . Fear of Current or Ex-Partner:   . Emotionally Abused:   Marland Kitchen Physically Abused:   . Sexually Abused:     Current Outpatient Medications on File Prior to Visit  Medication Sig Dispense Refill  . Continuous Blood Gluc Sensor (FREESTYLE LIBRE 14 DAY SENSOR) MISC 1 each by Does not apply route every 14 (fourteen) days. For use with continuous glucose monitoring system; Change sensor every 14 days; E11.9 6 each 2  . gabapentin (NEURONTIN) 300 MG capsule TAKE 1 CAPSULE BY MOUTH TWICE A DAY 180 capsule 3  . Insulin Pen Needle 31G X 8 MM MISC 1 each by Does not apply route daily. 100 each 0  . lisinopril-hydrochlorothiazide (ZESTORETIC) 20-25 MG tablet TAKE 1 TABLET BY MOUTH EVERY DAY 90 tablet 3  . simvastatin (ZOCOR) 40 MG tablet TAKE 1 TABLET BY MOUTH EVERY DAY 90 tablet 1   No current facility-administered medications on file prior to visit.    No Known Allergies  Family History  Problem Relation Age of Onset  . Diabetes Mother   .  Hypertension Sister   . Hypertension Brother     BP (!) 132/80   Pulse 95   Ht 5\' 6"  (1.676 m)   Wt (!) 276 lb 3.2 oz (125.3 kg)   SpO2 98%   BMI 44.58 kg/m   Review of Systems He denies LOC    Objective:   Physical Exam VITAL SIGNS:  See vs page GENERAL: no distress   Lab Results  Component Value Date   HGBA1C 6.1 (A) 02/24/2020   Lab Results  Component Value Date   TSH 1.05 01/19/2020        Assessment & Plan:  Type 1 DM, vs due to pancreatis: he ay be entering remission.   Patient Instructions  Please reduce the insulin to 30 units each morning.   We'll plan to recheck the thyroid ultrasound next year.   Please come back for a follow-up appointment in 6 weeks.

## 2020-02-24 NOTE — Patient Instructions (Addendum)
Please reduce the insulin to 30 units each morning.   We'll plan to recheck the thyroid ultrasound next year.   Please come back for a follow-up appointment in 6 weeks.

## 2020-03-13 DIAGNOSIS — E109 Type 1 diabetes mellitus without complications: Secondary | ICD-10-CM | POA: Insufficient documentation

## 2020-03-30 ENCOUNTER — Other Ambulatory Visit: Payer: Self-pay | Admitting: Emergency Medicine

## 2020-04-06 ENCOUNTER — Encounter: Payer: Self-pay | Admitting: Endocrinology

## 2020-04-06 ENCOUNTER — Other Ambulatory Visit: Payer: Self-pay

## 2020-04-06 ENCOUNTER — Ambulatory Visit: Payer: Medicaid Other | Admitting: Endocrinology

## 2020-04-06 VITALS — BP 110/80 | HR 76 | Ht 66.0 in | Wt 273.2 lb

## 2020-04-06 DIAGNOSIS — N1831 Chronic kidney disease, stage 3a: Secondary | ICD-10-CM

## 2020-04-06 DIAGNOSIS — E119 Type 2 diabetes mellitus without complications: Secondary | ICD-10-CM

## 2020-04-06 DIAGNOSIS — E1021 Type 1 diabetes mellitus with diabetic nephropathy: Secondary | ICD-10-CM

## 2020-04-06 DIAGNOSIS — E1022 Type 1 diabetes mellitus with diabetic chronic kidney disease: Secondary | ICD-10-CM

## 2020-04-06 LAB — POCT GLYCOSYLATED HEMOGLOBIN (HGB A1C): Hemoglobin A1C: 6.3 % — AB (ref 4.0–5.6)

## 2020-04-06 MED ORDER — LANTUS SOLOSTAR 100 UNIT/ML ~~LOC~~ SOPN
25.0000 [IU] | PEN_INJECTOR | SUBCUTANEOUS | 11 refills | Status: DC
Start: 2020-04-06 — End: 2020-06-06

## 2020-04-06 NOTE — Patient Instructions (Addendum)
Please reduce the insulin to 25 units each morning.   We'll plan to recheck the thyroid ultrasound next year.   Please come back for a follow-up appointment in 2 months.

## 2020-04-06 NOTE — Progress Notes (Signed)
Subjective:    Patient ID: Alexander Cochran, male    DOB: [birth year], 64 y.o.   MRN: [id number]  HPI Pt returns for f/u of diabetes mellitus: DM type: 1 Dx'ed: 1275 Complications: CRI Therapy: insulin since dx DKA: never Severe hypoglycemia: never Pancreatitis: once (2020) Pancreatic imaging: pancreatitis was seen on 2020 CT SDOH: none.   Other: he may be entering partial remission.  Interval history: I reviewed continuous glucose monitor data.  Glucose varies from 50-250.  It is in general lowest at 3 AM, and highest PC.   MNG (dx'ed 2021; [place] advised annual f/u; TSH is normal on no rx) Past Medical History:  Diagnosis Date  . Hyperlipidemia   . Hypertension     Past Surgical History:  Procedure Laterality Date  . CYST EXCISION PERINEAL  1984    Social History   Socioeconomic History  . Marital status: Married    Spouse name: Not on file  . Number of children: Not on file  . Years of education: Not on file  . Highest education level: Not on file  Occupational History  . Not on file  Tobacco Use  . Smoking status: Former Smoker    Packs/day: 0.25    Years: 2.00    Pack years: 0.50    Quit date: 12/28/1973    Years since quitting: 46.3  . Smokeless tobacco: Never Used  Substance and Sexual Activity  . Alcohol use: Yes  . Drug use: No  . Sexual activity: Never  Other Topics Concern  . Not on file  Social History Narrative  . Not on file   Social Determinants of Health   Financial Resource Strain:   . Difficulty of Paying Living Expenses: Not on file  Food Insecurity:   . Worried About Charity fundraiser in the Last Year: Not on file  . Ran Out of Food in the Last Year: Not on file  Transportation Needs:   . Lack of Transportation (Medical): Not on file  . Lack of Transportation (Non-Medical): Not on file  Physical Activity:   . Days of Exercise per Week: Not on file  . Minutes of Exercise per Session: Not on file  Stress:   . Feeling of Stress : Not  on file  Social Connections:   . Frequency of Communication with Friends and Family: Not on file  . Frequency of Social Gatherings with Friends and Family: Not on file  . Attends Religious Services: Not on file  . Active Member of Clubs or Organizations: Not on file  . Attends Archivist Meetings: Not on file  . Marital Status: Not on file  Intimate Partner Violence:   . Fear of Current or Ex-Partner: Not on file  . Emotionally Abused: Not on file  . Physically Abused: Not on file  . Sexually Abused: Not on file    Current Outpatient Medications on File Prior to Visit  Medication Sig Dispense Refill  . B-D ULTRAFINE III SHORT PEN 31G X 8 MM MISC USE AS DIRECTED 100 each 1  . Continuous Blood Gluc Sensor (FREESTYLE LIBRE 14 DAY SENSOR) MISC 1 each by Does not apply route every 14 (fourteen) days. For use with continuous glucose monitoring system; Change sensor every 14 days; E11.9 6 each 2  . gabapentin (NEURONTIN) 300 MG capsule TAKE 1 CAPSULE BY MOUTH TWICE A DAY 180 capsule 3  . lisinopril-hydrochlorothiazide (ZESTORETIC) 20-25 MG tablet TAKE 1 TABLET BY MOUTH EVERY DAY 90 tablet 3  .  simvastatin (ZOCOR) 40 MG tablet TAKE 1 TABLET BY MOUTH EVERY DAY 90 tablet 1   No current facility-administered medications on file prior to visit.    No Known Allergies  Family History  Problem Relation Age of Onset  . Diabetes Mother   . Hypertension Sister   . Hypertension Brother     BP 110/80 (BP Location: Left Arm, Patient Position: Sitting, Cuff Size: Large)   Pulse 76   Ht 5\' 6"  (1.676 m)   Wt 273 lb 3.2 oz (123.9 kg)   SpO2 95%   BMI 44.10 kg/m    Review of Systems Denies LOC    Objective:   Physical Exam VITAL SIGNS:  See vs page GENERAL: no distress Pulses: dorsalis pedis intact bilat.   MSK: no deformity of the feet CV: 1+ bilat leg edema Skin:  no ulcer on the feet.  normal temp on the feet.  There is hyperpigmentation and shin thickening at the  intertrigenous areas of the toes.  Neuro: sensation is intact to touch on the feet.   Ext: there is bilateral onychomycosis of the toenails.     Lab Results  Component Value Date   HGBA1C 6.3 (A) 04/06/2020   Lab Results  Component Value Date   TSH 1.05 01/19/2020      Assessment & Plan:  Type 1 DM.  He is entering partial remission Hypoglycemia, due to insulin: this limits aggressiveness of glycemic control   Patient Instructions  Please reduce the insulin to 25 units each morning.   We'll plan to recheck the thyroid ultrasound next year.   Please come back for a follow-up appointment in 2 months.

## 2020-04-19 ENCOUNTER — Other Ambulatory Visit: Payer: Self-pay

## 2020-04-19 ENCOUNTER — Encounter: Payer: Self-pay | Admitting: Podiatry

## 2020-04-19 ENCOUNTER — Ambulatory Visit: Payer: Self-pay | Admitting: Podiatry

## 2020-04-19 DIAGNOSIS — B351 Tinea unguium: Secondary | ICD-10-CM

## 2020-04-19 DIAGNOSIS — B353 Tinea pedis: Secondary | ICD-10-CM

## 2020-04-19 DIAGNOSIS — M79674 Pain in right toe(s): Secondary | ICD-10-CM

## 2020-04-19 DIAGNOSIS — E1142 Type 2 diabetes mellitus with diabetic polyneuropathy: Secondary | ICD-10-CM

## 2020-04-19 DIAGNOSIS — M79675 Pain in left toe(s): Secondary | ICD-10-CM

## 2020-04-19 MED ORDER — CLOTRIMAZOLE 1 % EX CREA: TOPICAL_CREAM | CUTANEOUS | 1 refills | Status: DC

## 2020-04-19 NOTE — Patient Instructions (Signed)

## 2020-04-23 NOTE — Progress Notes (Addendum)
Subjective:  Patient ID: Alexander Cochran, male    DOB: 07/19/1956,  MRN: [id number]  64 y.o. male presents with preventative diabetic foot care and painful thick toenails that are difficult to trim. Pain interferes with ambulation. Aggravating factors include wearing enclosed shoe gear. Pain is relieved with periodic professional debridement.   He states blood sugar was 94 mg/dl this morning.   He voices no new pedal concerns on today's visit.  Review of Systems: Negative except as noted in the HPI.  Past Medical History:  Diagnosis Date  . Hyperlipidemia   . Hypertension    Past Surgical History:  Procedure Laterality Date  . CYST EXCISION PERINEAL  1984   Patient Active Problem List   Diagnosis Date Noted  . Type 1 diabetes ([place]) 03/13/2020  . Diabetic polyneuropathy associated with type 2 diabetes mellitus ([place]) 09/26/2019  . Type 2 diabetes mellitus with hyperglycemia ([place]) 09/26/2019  . Dyslipidemia associated with type 2 diabetes mellitus ([place]) 09/26/2019  . Stage 3a chronic kidney disease 09/26/2019  . Hyperosmolar hyperglycemic coma due to diabetes mellitus without ketoacidosis ([place]) 08/30/2018  . Left renal mass 08/30/2018  . Mass of thyroid region 08/30/2018  . Type 2 MI (myocardial infarction) ([place]) 08/30/2018  . Acute on chronic kidney failure ([place]) 08/28/2018  . Morbid obesity ([place]) 08/28/2018  . Severe acute pancreatitis 08/28/2018  . Shock ([place]) 08/28/2018  . BMI 38.0-38.9,adult 10/21/2011  . 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 Hyperlipemia 10/21/2011    Current Outpatient Medications:  .  B-D ULTRAFINE III SHORT PEN 31G X 8 MM MISC, USE AS DIRECTED, Disp: 100 each, Rfl: 1 .  clotrimazole (LOTRIMIN) 1 % cream, Apply to both feet and between toes twice daily for 6 weeks., Disp: 60 g, Rfl: 1 .  Continuous Blood Gluc Sensor (FREESTYLE LIBRE 14 DAY SENSOR) MISC, 1 each by Does not apply route every 14 (fourteen) days. For use with continuous glucose  monitoring system; Change sensor every 14 days; E11.9, Disp: 6 each, Rfl: 2 .  gabapentin (NEURONTIN) 300 MG capsule, TAKE 1 CAPSULE BY MOUTH TWICE A DAY, Disp: 180 capsule, Rfl: 3 .  insulin glargine (LANTUS SOLOSTAR) 100 UNIT/ML Solostar Pen, Inject 25 Units into the skin every morning. And pen needles 1/day, Disp: 15 mL, Rfl: 11 .  lisinopril-hydrochlorothiazide (ZESTORETIC) 20-25 MG tablet, TAKE 1 TABLET BY MOUTH EVERY DAY, Disp: 90 tablet, Rfl: 3 .  simvastatin (ZOCOR) 40 MG tablet, TAKE 1 TABLET BY MOUTH EVERY DAY, Disp: 90 tablet, Rfl: 1 No Known Allergies Social History   Tobacco Use  Smoking Status Former Smoker  . Packs/day: 0.25  . Years: 2.00  . Pack years: 0.50  . Quit date: 12/28/1973  . Years since quitting: 46.3  Smokeless Tobacco Never Used   Objective:  There were no vitals filed for this visit. Constitutional Patient is a pleasant 64 y.o. African American male in NAD.Marland Kitchen AAO x 3.  Vascular Capillary refill time to digits immediate b/l. Capillary fill time to digits delayed b/l lower extremities. Faintly palpable DP pulse(s) b/l lower extremities. Faintly palpable PT pulse(s) b/l lower extremities. Pedal hair sparse. Lower extremity skin temperature gradient within normal limits. No cyanosis or clubbing noted.  Neurologic Normal speech. Oriented to person, place, and time. Protective sensation diminished with 10g monofilament b/l. Vibratory sensation diminished b/l.  Dermatologic Pedal skin with normal turgor, texture and tone bilaterally. No open wounds bilaterally. No interdigital macerations bilaterally. Toenails 1-5 b/l elongated, discolored, dystrophic, thickened, crumbly with subungual debris  and tenderness to dorsal palpation. Diffuse scaling noted interdigitally b/l feet with mild foot odor.  No interdigital macerations.  No blisters, no weeping. No signs of secondary bacterial infection noted.  Orthopedic: Normal muscle strength 5/5 to all lower extremity muscle  groups bilaterally. No pain crepitus or joint limitation noted with ROM b/l. Pes planus deformity noted b/l.    Hemoglobin A1C Latest Ref Rng & Units 04/06/2020 02/24/2020 01/19/2020 09/26/2019  HGBA1C 4.0 - 5.6 % 6.3(A) 6.1(A) 5.9(A) 6.6(A)  Some recent data might be hidden   Assessment:   1. Pain due to onychomycosis of toenails of both feet   2. Tinea pedis of both feet   3. Diabetic peripheral neuropathy associated with type 2 diabetes mellitus ([place])    Plan:  Patient was evaluated and treated and all questions answered.  Onychomycosis with pain -Nails palliatively debridement as below. -Educated on self-care  Procedure: Nail Debridement Rationale: Pain Type of Debridement: manual, sharp debridement. Instrumentation: Nail nipper, rotary burr. Number of Nails: 10  -Examined patient. -Continue diabetic foot care principles. -Toenails 1-5 b/l were debrided in length and girth with sterile nail nippers and dremel without iatrogenic bleeding.  -Patient to report any pedal injuries to medical professional immediately. --For tinea pedis, Rx sent to pharmacy for Clotrimazole Cream 1% to be applied to feet twice daily for 6 weeks. -Patient to continue soft, supportive shoe gear daily. -Patient/POA to call should there be question/concern in the interim.  Return in about 3 months (around 07/19/2020).  [name], DPM

## 2020-06-06 ENCOUNTER — Ambulatory Visit (INDEPENDENT_AMBULATORY_CARE_PROVIDER_SITE_OTHER): Payer: Medicaid Other | Admitting: Endocrinology

## 2020-06-06 ENCOUNTER — Encounter: Payer: Self-pay | Admitting: Endocrinology

## 2020-06-06 ENCOUNTER — Other Ambulatory Visit: Payer: Self-pay

## 2020-06-06 VITALS — BP 110/62 | HR 98 | Ht 66.0 in | Wt 272.0 lb

## 2020-06-06 DIAGNOSIS — N1832 Chronic kidney disease, stage 3b: Secondary | ICD-10-CM

## 2020-06-06 DIAGNOSIS — E119 Type 2 diabetes mellitus without complications: Secondary | ICD-10-CM

## 2020-06-06 DIAGNOSIS — N1831 Chronic kidney disease, stage 3a: Secondary | ICD-10-CM

## 2020-06-06 DIAGNOSIS — E1022 Type 1 diabetes mellitus with diabetic chronic kidney disease: Secondary | ICD-10-CM

## 2020-06-06 LAB — POCT GLYCOSYLATED HEMOGLOBIN (HGB A1C): Hemoglobin A1C: 7 % — AB (ref 4.0–5.6)

## 2020-06-06 MED ORDER — LANTUS SOLOSTAR 100 UNIT/ML ~~LOC~~ SOPN
24.0000 [IU] | PEN_INJECTOR | SUBCUTANEOUS | 11 refills | Status: DC
Start: 2020-06-06 — End: 2020-09-25

## 2020-06-06 NOTE — Progress Notes (Signed)
Subjective:    Patient ID: Alexander Cochran, male    DOB: 12/25/1955, 63 y.o.   MRN: [id number]  HPI Pt returns for f/u of diabetes mellitus: DM type: 1 Dx'ed: 4332 Complications: stage 3b CRI Therapy: insulin since dx DKA: never Severe hypoglycemia: never Pancreatitis: once (2020) Pancreatic imaging: pancreatitis was seen on 2020 CT SDOH: none.   Other: he may be entering partial remission.  Interval history: I reviewed continuous glucose monitor data.  Glucose varies from 75-290.  It is in general lowest at 3-5 AM, and highest at HS. Pt also has MNG (dx'ed 2021; [place] advised annual f/u; TSH is normal on no rx).   Past Medical History:  Diagnosis Date  . Hyperlipidemia   . Hypertension     Past Surgical History:  Procedure Laterality Date  . CYST EXCISION PERINEAL  1984    Social History   Socioeconomic History  . Marital status: Married    Spouse name: Not on file  . Number of children: Not on file  . Years of education: Not on file  . Highest education level: Not on file  Occupational History  . Not on file  Tobacco Use  . Smoking status: Former Smoker    Packs/day: 0.25    Years: 2.00    Pack years: 0.50    Quit date: 12/28/1973    Years since quitting: 46.4  . Smokeless tobacco: Never Used  Substance and Sexual Activity  . Alcohol use: Yes  . Drug use: No  . Sexual activity: Never  Other Topics Concern  . Not on file  Social History Narrative  . Not on file   Social Determinants of Health   Financial Resource Strain:   . Difficulty of Paying Living Expenses: Not on file  Food Insecurity:   . Worried About Charity fundraiser in the Last Year: Not on file  . Ran Out of Food in the Last Year: Not on file  Transportation Needs:   . Lack of Transportation (Medical): Not on file  . Lack of Transportation (Non-Medical): Not on file  Physical Activity:   . Days of Exercise per Week: Not on file  . Minutes of Exercise per Session: Not on file  Stress:    . Feeling of Stress : Not on file  Social Connections:   . Frequency of Communication with Friends and Family: Not on file  . Frequency of Social Gatherings with Friends and Family: Not on file  . Attends Religious Services: Not on file  . Active Member of Clubs or Organizations: Not on file  . Attends Archivist Meetings: Not on file  . Marital Status: Not on file  Intimate Partner Violence:   . Fear of Current or Ex-Partner: Not on file  . Emotionally Abused: Not on file  . Physically Abused: Not on file  . Sexually Abused: Not on file    Current Outpatient Medications on File Prior to Visit  Medication Sig Dispense Refill  . B-D ULTRAFINE III SHORT PEN 31G X 8 MM MISC USE AS DIRECTED 100 each 1  . clotrimazole (LOTRIMIN) 1 % cream Apply to both feet and between toes twice daily for 6 weeks. 60 g 1  . Continuous Blood Gluc Sensor (FREESTYLE LIBRE 14 DAY SENSOR) MISC 1 each by Does not apply route every 14 (fourteen) days. For use with continuous glucose monitoring system; Change sensor every 14 days; E11.9 6 each 2  . gabapentin (NEURONTIN) 300 MG capsule TAKE  1 CAPSULE BY MOUTH TWICE A DAY 180 capsule 3  . lisinopril-hydrochlorothiazide (ZESTORETIC) 20-25 MG tablet TAKE 1 TABLET BY MOUTH EVERY DAY 90 tablet 3  . simvastatin (ZOCOR) 40 MG tablet TAKE 1 TABLET BY MOUTH EVERY DAY 90 tablet 1   No current facility-administered medications on file prior to visit.    No Known Allergies  Family History  Problem Relation Age of Onset  . Diabetes Mother   . Hypertension Sister   . Hypertension Brother     BP 110/62   Pulse 98   Ht 5\' 6"  (1.676 m)   Wt 272 lb (123.4 kg)   SpO2 98%   BMI 43.90 kg/m    Review of Systems He denies hypoglycemia    Objective:   Physical Exam VITAL SIGNS:  See vs page GENERAL: no distress Pulses: dorsalis pedis intact bilat.   MSK: no deformity of the feet CV: 1+ bilat leg edema Skin:  no ulcer on the feet.  normal temp on the  feet.  There is hyperpigmentation and shin thickening at the intertrigenous areas of the toes.  Neuro: sensation is intact to touch on the feet.   Ext: there is bilateral onychomycosis of the toenails.     Lab Results  Component Value Date   HGBA1C 7.0 (A) 06/06/2020   Lab Results  Component Value Date   CREATININE 1.60 (H) 09/26/2019   BUN 16 09/26/2019   NA 146 (H) 09/26/2019   K 3.8 09/26/2019   CL 104 09/26/2019   CO2 22 09/26/2019       Assessment & Plan:  Type 1 DM, with stage 3b CRI: overcontrolled, given this regimen, which does match insulin to his changing needs throughout the day.   Patient Instructions  Please reduce the insulin to 24 units each morning.   We'll plan to recheck the thyroid ultrasound next year.   Please come back for a follow-up appointment in 3 months.

## 2020-06-06 NOTE — Patient Instructions (Addendum)
Please reduce the insulin to 24 units each morning.   We'll plan to recheck the thyroid ultrasound next year.   Please come back for a follow-up appointment in 3 months.

## 2020-06-27 ENCOUNTER — Encounter: Payer: Self-pay | Admitting: Emergency Medicine

## 2020-06-27 ENCOUNTER — Ambulatory Visit (INDEPENDENT_AMBULATORY_CARE_PROVIDER_SITE_OTHER): Payer: Self-pay | Admitting: Emergency Medicine

## 2020-06-27 ENCOUNTER — Other Ambulatory Visit: Payer: Self-pay

## 2020-06-27 VITALS — BP 130/80 | HR 88 | Temp 98.2°F | Resp 16 | Ht 66.0 in | Wt 267.0 lb

## 2020-06-27 DIAGNOSIS — Z794 Long term (current) use of insulin: Secondary | ICD-10-CM

## 2020-06-27 DIAGNOSIS — E1159 Type 2 diabetes mellitus with other circulatory complications: Secondary | ICD-10-CM

## 2020-06-27 DIAGNOSIS — Z6841 Body Mass Index (BMI) 40.0 and over, adult: Secondary | ICD-10-CM

## 2020-06-27 DIAGNOSIS — D573 Sickle-cell trait: Secondary | ICD-10-CM

## 2020-06-27 DIAGNOSIS — E785 Hyperlipidemia, unspecified: Secondary | ICD-10-CM

## 2020-06-27 DIAGNOSIS — N1831 Chronic kidney disease, stage 3a: Secondary | ICD-10-CM

## 2020-06-27 DIAGNOSIS — E1169 Type 2 diabetes mellitus with other specified complication: Secondary | ICD-10-CM

## 2020-06-27 DIAGNOSIS — E1165 Type 2 diabetes mellitus with hyperglycemia: Secondary | ICD-10-CM

## 2020-06-27 DIAGNOSIS — I152 Hypertension secondary to endocrine disorders: Secondary | ICD-10-CM

## 2020-06-27 LAB — POCT GLYCOSYLATED HEMOGLOBIN (HGB A1C): Hemoglobin A1C: 7.8 % — AB (ref 4.0–5.6)

## 2020-06-27 LAB — COMPREHENSIVE METABOLIC PANEL
ALT: 21 IU/L (ref 0–44)
AST: 16 IU/L (ref 0–40)
Albumin/Globulin Ratio: 2 (ref 1.2–2.2)
Albumin: 5 g/dL — ABNORMAL HIGH (ref 3.8–4.8)
Alkaline Phosphatase: 69 IU/L (ref 44–121)
BUN/Creatinine Ratio: 15 (ref 10–24)
BUN: 26 mg/dL (ref 8–27)
Bilirubin Total: 0.5 mg/dL (ref 0.0–1.2)
CO2: 25 mmol/L (ref 20–29)
Calcium: 10.6 mg/dL — ABNORMAL HIGH (ref 8.6–10.2)
Chloride: 101 mmol/L (ref 96–106)
Creatinine, Ser: 1.74 mg/dL — ABNORMAL HIGH (ref 0.76–1.27)
GFR calc Af Amer: 47 mL/min/{1.73_m2} — ABNORMAL LOW (ref >59)
GFR calc non Af Amer: 41 mL/min/{1.73_m2} — ABNORMAL LOW (ref >59)
Globulin, Total: 2.5 g/dL (ref 1.5–4.5)
Glucose: 186 mg/dL — ABNORMAL HIGH (ref 65–99)
Potassium: 3.7 mmol/L (ref 3.5–5.2)
Sodium: 143 mmol/L (ref 134–144)
Total Protein: 7.5 g/dL (ref 6.0–8.5)

## 2020-06-27 LAB — GLUCOSE, POCT (MANUAL RESULT ENTRY): POC Glucose: 174 mg/dl — AB (ref 70–99)

## 2020-06-27 LAB — LIPID PANEL
Chol/HDL Ratio: 4.8 ratio (ref 0.0–5.0)
Cholesterol, Total: 168 mg/dL (ref 100–199)
HDL: 35 mg/dL — ABNORMAL LOW (ref >39)
LDL Chol Calc (NIH): 73 mg/dL (ref 0–99)
Triglycerides: 378 mg/dL — ABNORMAL HIGH (ref 0–149)
VLDL Cholesterol Cal: 60 mg/dL — ABNORMAL HIGH (ref 5–40)

## 2020-06-27 NOTE — Progress Notes (Signed)
Alexander Cochran 64 y.o.   Chief Complaint  Patient presents with  . Diabetes    follow up 6 month    HISTORY OF PRESENT ILLNESS: This is a 64 y.o. male with history of diabetes and hypertension here for follow-up. #1 diabetes: On insulin glargine 24 units daily.  Blood sugars at home between 100-150's. Lab Results  Component Value Date   HGBA1C 7.0 (A) 06/06/2020  #2 hypertension: On Zestoretic 20-25 mg daily. #3 dyslipidemia: On simvastatin 40 mg daily. Has no concerns or medical concerns today. Fully vaccinated against Covid.  HPI   Prior to Admission medications   Medication Sig Start Date End Date Taking? Authorizing Provider  B-D ULTRAFINE III SHORT PEN 31G X 8 MM MISC USE AS DIRECTED 03/30/20  Yes Cornisha Zetino, Ines Bloomer, MD  clotrimazole (LOTRIMIN) 1 % cream Apply to both feet and between toes twice daily for 6 weeks. 04/19/20  Yes [name], DPM  Continuous Blood Gluc Sensor (FREESTYLE LIBRE 14 DAY SENSOR) MISC 1 each by Does not apply route every 14 (fourteen) days. For use with continuous glucose monitoring system; Change sensor every 14 days; E11.9 01/19/20  Yes Renato Shin, MD  gabapentin (NEURONTIN) 300 MG capsule TAKE 1 CAPSULE BY MOUTH TWICE A DAY 10/18/19  Yes Ashland Wiseman, Ines Bloomer, MD  insulin glargine (LANTUS SOLOSTAR) 100 UNIT/ML Solostar Pen Inject 24 Units into the skin every morning. And pen needles 1/day 06/06/20  Yes Renato Shin, MD  lisinopril-hydrochlorothiazide (ZESTORETIC) 20-25 MG tablet TAKE 1 TABLET BY MOUTH EVERY DAY 11/16/19  Yes [name], MD  simvastatin (ZOCOR) 40 MG tablet TAKE 1 TABLET BY MOUTH EVERY DAY 01/19/20  Yes [name], MD    No Known Allergies  Patient Active Problem List   Diagnosis Date Noted  . Type 1 diabetes ([place]) 03/13/2020  . Diabetic polyneuropathy associated with type 2 diabetes mellitus ([place]) 09/26/2019  . Type 2 diabetes mellitus with hyperglycemia ([place]) 09/26/2019  . Dyslipidemia  associated with type 2 diabetes mellitus ([place]) 09/26/2019  . Stage 3a chronic kidney disease ([place]) 09/26/2019  . Hyperosmolar hyperglycemic coma due to diabetes mellitus without ketoacidosis ([place]) 08/30/2018  . Left renal mass 08/30/2018  . Mass of thyroid region 08/30/2018  . Type 2 MI (myocardial infarction) ([place]) 08/30/2018  . Acute on chronic kidney failure ([place]) 08/28/2018  . Morbid obesity ([place]) 08/28/2018  . Severe acute pancreatitis 08/28/2018  . Shock ([place]) 08/28/2018  . BMI 38.0-38.9,adult 10/21/2011  . 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 Hyperlipemia 10/21/2011    Past Medical History:  Diagnosis Date  . Hyperlipidemia   . Hypertension     Past Surgical History:  Procedure Laterality Date  . CYST EXCISION PERINEAL  1984    Social History   Socioeconomic History  . Marital status: Married    Spouse name: Not on file  . Number of children: Not on file  . Years of education: Not on file  . Highest education level: Not on file  Occupational History  . Not on file  Tobacco Use  . Smoking status: Former Smoker    Packs/day: 0.25    Years: 2.00    Pack years: 0.50    Quit date: 12/28/1973    Years since quitting: 46.5  . Smokeless tobacco: Never Used  Substance and Sexual Activity  . Alcohol use: Yes  . Drug use: No  . Sexual activity: Never  Other Topics Concern  . Not on file  Social History Narrative  .  Not on file   Social Determinants of Health   Financial Resource Strain:   . Difficulty of Paying Living Expenses: Not on file  Food Insecurity:   . Worried About Charity fundraiser in the Last Year: Not on file  . Ran Out of Food in the Last Year: Not on file  Transportation Needs:   . Lack of Transportation (Medical): Not on file  . Lack of Transportation (Non-Medical): Not on file  Physical Activity:   . Days of Exercise per Week: Not on file  . Minutes of Exercise per Session: Not on file  Stress:   . Feeling of Stress : Not on file    Social Connections:   . Frequency of Communication with Friends and Family: Not on file  . Frequency of Social Gatherings with Friends and Family: Not on file  . Attends Religious Services: Not on file  . Active Member of Clubs or Organizations: Not on file  . Attends Archivist Meetings: Not on file  . Marital Status: Not on file  Intimate Partner Violence:   . Fear of Current or Ex-Partner: Not on file  . Emotionally Abused: Not on file  . Physically Abused: Not on file  . Sexually Abused: Not on file    Family History  Problem Relation Age of Onset  . Diabetes Mother   . Hypertension Sister   . Hypertension Brother      Review of Systems  Constitutional: Negative.  Negative for chills and fever.  HENT: Negative.  Negative for congestion and sore throat.   Respiratory: Negative.  Negative for cough and shortness of breath.   Cardiovascular: Negative.  Negative for chest pain and palpitations.  Gastrointestinal: Negative.  Negative for abdominal pain, diarrhea, nausea and vomiting.  Genitourinary: Negative.  Negative for dysuria and hematuria.  Skin: Negative.   Neurological: Negative.  Negative for dizziness and headaches.  All other systems reviewed and are negative.   Today's Vitals   06/27/20 1024  BP: (!) 149/89  Pulse: 88  Resp: 16  Temp: 98.2 F (36.8 C)  TempSrc: Temporal  SpO2: 95%  Weight: 267 lb (121.1 kg)  Height: 5\' 6"  (1.676 m)   Body mass index is 43.09 kg/m. Wt Readings from Last 3 Encounters:  06/27/20 267 lb (121.1 kg)  06/06/20 272 lb (123.4 kg)  04/06/20 273 lb 3.2 oz (123.9 kg)    Physical Exam Vitals reviewed.  Constitutional:      Appearance: Normal appearance. He is obese.  HENT:     Head: Normocephalic.  Eyes:     Extraocular Movements: Extraocular movements intact.     Conjunctiva/sclera: Conjunctivae normal.     Pupils: Pupils are equal, round, and reactive to light.  Cardiovascular:     Rate and Rhythm: Normal  rate and regular rhythm.     Pulses: Normal pulses.     Heart sounds: Normal heart sounds.  Pulmonary:     Effort: Pulmonary effort is normal.     Breath sounds: Normal breath sounds.  Musculoskeletal:        General: Normal range of motion.     Cervical back: Normal range of motion and neck supple. No tenderness.  Skin:    General: Skin is warm and dry.     Capillary Refill: Capillary refill takes less than 2 seconds.  Neurological:     General: No focal deficit present.     Mental Status: He is alert and oriented to person, place, and  time.  Psychiatric:        Mood and Affect: Mood normal.        Behavior: Behavior normal.     Results for orders placed or performed in visit on 06/27/20 (from the past 24 hour(s))  POCT glucose (manual entry)     Status: Abnormal   Collection Time: 06/27/20 11:16 AM  Result Value Ref Range   POC Glucose 174 (A) 70 - 99 mg/dl  POCT glycosylated hemoglobin (Hb A1C)     Status: Abnormal   Collection Time: 06/27/20 11:17 AM  Result Value Ref Range   Hemoglobin A1C 7.8 (A) 4.0 - 5.6 %   HbA1c POC (<> result, manual entry)     HbA1c, POC (prediabetic range)     HbA1c, POC (controlled diabetic range)      ASSESSMENT & PLAN: Type 2 diabetes mellitus with hyperglycemia (HCC) Uncontrolled diabetes with hemoglobin A1c of 7.8. Increase insulin glargine to 28 units daily. Continue monitoring home blood glucose readings. Diet and nutrition discussed. Follow-up in 3 months.  Ova was seen today for diabetes.  Diagnoses and all orders for this visit:  Hypertension associated with type 2 diabetes mellitus ([place]) -     Comprehensive metabolic panel -     Lipid panel -     POCT glucose (manual entry) -     POCT glycosylated hemoglobin (Hb A1C)  Morbid obesity (HCC)  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HCC)  Body mass index (BMI) of 40.1-44.9 in adult [facility])  Type 2 diabetes mellitus with hyperglycemia, with long-term current use of insulin  (HCC)  Dyslipidemia associated with type 2 diabetes mellitus ([place])  Stage 3a chronic kidney disease ([place])    Patient Instructions   Increase daily insulin glargine to 28 units and continue monitoring blood glucose at home.  Increase to 30 units if persistently high. Follow-up in 3 months. Diabetes Mellitus and Nutrition, Adult When you have diabetes (diabetes mellitus), it is very important to have healthy eating habits because your blood sugar (glucose) levels are greatly affected by what you eat and drink. Eating healthy foods in the appropriate amounts, at about the same times every day, can help you:  Control your blood glucose.  Lower your risk of heart disease.  Improve your blood pressure.  Reach or maintain a healthy weight. Every person with diabetes is different, and each person has different needs for a meal plan. Your health care provider may recommend that you work with a diet and nutrition specialist (dietitian) to make a meal plan that is best for you. Your meal plan may vary depending on factors such as:  The calories you need.  The medicines you take.  Your weight.  Your blood glucose, blood pressure, and cholesterol levels.  Your activity level.  Other health conditions you have, such as heart or kidney disease. How do carbohydrates affect me? Carbohydrates, also called carbs, affect your blood glucose level more than any other type of food. Eating carbs naturally raises the amount of glucose in your blood. Carb counting is a method for keeping track of how many carbs you eat. Counting carbs is important to keep your blood glucose at a healthy level, especially if you use insulin or take certain oral diabetes medicines. It is important to know how many carbs you can safely have in each meal. This is different for every person. Your dietitian can help you calculate how many carbs you should have at each meal and for each snack. Foods that contain carbs  include:  Bread, cereal, rice, pasta, and crackers.  Potatoes and corn.  Peas, beans, and lentils.  Milk and yogurt.  Fruit and juice.  Desserts, such as cakes, cookies, ice cream, and candy. How does alcohol affect me? Alcohol can cause a sudden decrease in blood glucose (hypoglycemia), especially if you use insulin or take certain oral diabetes medicines. Hypoglycemia can be a life-threatening condition. Symptoms of hypoglycemia (sleepiness, dizziness, and confusion) are similar to symptoms of having too much alcohol. If your health care provider says that alcohol is safe for you, follow these guidelines:  Limit alcohol intake to no more than 1 drink per day for nonpregnant women and 2 drinks per day for men. One drink equals 12 oz of beer, 5 oz of wine, or 1 oz of hard liquor.  Do not drink on an empty stomach.  Keep yourself hydrated with water, diet soda, or unsweetened iced tea.  Keep in mind that regular soda, juice, and other mixers may contain a lot of sugar and must be counted as carbs. What are tips for following this plan?  Reading food labels  Start by checking the serving size on the "Nutrition Facts" label of packaged foods and drinks. The amount of calories, carbs, fats, and other nutrients listed on the label is based on one serving of the item. Many items contain more than one serving per package.  Check the total grams (g) of carbs in one serving. You can calculate the number of servings of carbs in one serving by dividing the total carbs by 15. For example, if a food has 30 g of total carbs, it would be equal to 2 servings of carbs.  Check the number of grams (g) of saturated and trans fats in one serving. Choose foods that have low or no amount of these fats.  Check the number of milligrams (mg) of salt (sodium) in one serving. Most people should limit total sodium intake to less than 2,300 mg per day.  Always check the nutrition information of foods labeled  as "low-fat" or "nonfat". These foods may be higher in added sugar or refined carbs and should be avoided.  Talk to your dietitian to identify your daily goals for nutrients listed on the label. Shopping  Avoid buying canned, premade, or processed foods. These foods tend to be high in fat, sodium, and added sugar.  Shop around the outside edge of the grocery store. This includes fresh fruits and vegetables, bulk grains, fresh meats, and fresh dairy. Cooking  Use low-heat cooking methods, such as baking, instead of high-heat cooking methods like deep frying.  Cook using healthy oils, such as olive, canola, or sunflower oil.  Avoid cooking with butter, cream, or high-fat meats. Meal planning  Eat meals and snacks regularly, preferably at the same times every day. Avoid going long periods of time without eating.  Eat foods high in fiber, such as fresh fruits, vegetables, beans, and whole grains. Talk to your dietitian about how many servings of carbs you can eat at each meal.  Eat 4-6 ounces (oz) of lean protein each day, such as lean meat, chicken, fish, eggs, or tofu. One oz of lean protein is equal to: ? 1 oz of meat, chicken, or fish. ? 1 egg. ?  cup of tofu.  Eat some foods each day that contain healthy fats, such as avocado, nuts, seeds, and fish. Lifestyle  Check your blood glucose regularly.  Exercise regularly as told by your health care provider. This  may include: ? 150 minutes of moderate-intensity or vigorous-intensity exercise each week. This could be brisk walking, biking, or water aerobics. ? Stretching and doing strength exercises, such as yoga or weightlifting, at least 2 times a week.  Take medicines as told by your health care provider.  Do not use any products that contain nicotine or tobacco, such as cigarettes and e-cigarettes. If you need help quitting, ask your health care provider.  Work with a Social worker or diabetes educator to identify strategies to  manage stress and any emotional and social challenges. Questions to ask a health care provider  Do I need to meet with a diabetes educator?  Do I need to meet with a dietitian?  What number can I call if I have questions?  When are the best times to check my blood glucose? Where to find more information:  American Diabetes Association: diabetes.org  Academy of Nutrition and Dietetics: www.eatright.[employer] of Diabetes and Digestive and Kidney Diseases (NIH): [employer] Summary  A healthy meal plan will help you control your blood glucose and maintain a healthy lifestyle.  Working with a diet and nutrition specialist (dietitian) can help you make a meal plan that is best for you.  Keep in mind that carbohydrates (carbs) and alcohol have immediate effects on your blood glucose levels. It is important to count carbs and to use alcohol carefully. This information is not intended to replace advice given to you by your health care provider. Make sure you discuss any questions you have with your health care provider. Document Revised: 06/26/2017 Document Reviewed: 08/18/2016 Elsevier Patient Education  [employer].     If you have lab work done today you will be contacted with your lab results within the next 2 weeks.  If you have not heard from [place] then please contact us. The fastest way to get your results is to register for My Chart.   IF you received an x-ray today, you will receive an invoice from [facility]. Please contact [facility] at [phone] with questions or concerns regarding your invoice.   IF you received labwork today, you will receive an invoice from [facility]. Please contact [facility] at [phone] with questions or concerns regarding your invoice.   Our billing staff will not be able to assist you with questions regarding bills from these companies.  You will be contacted with the lab results as soon as they are  available. The fastest way to get your results is to activate your My Chart account. Instructions are located on the last page of this paperwork. If you have not heard from [place] regarding the results in 2 weeks, please contact this office.         Agustina Caroli, MD [facility]

## 2020-06-27 NOTE — Patient Instructions (Addendum)
Increase daily insulin glargine to 28 units and continue monitoring blood glucose at home.  Increase to 30 units if persistently high. Follow-up in 3 months. Diabetes Mellitus and Nutrition, Adult When you have diabetes (diabetes mellitus), it is very important to have healthy eating habits because your blood sugar (glucose) levels are greatly affected by what you eat and drink. Eating healthy foods in the appropriate amounts, at about the same times every day, can help you:  Control your blood glucose.  Lower your risk of heart disease.  Improve your blood pressure.  Reach or maintain a healthy weight. Every person with diabetes is different, and each person has different needs for a meal plan. Your health care provider may recommend that you work with a diet and nutrition specialist (dietitian) to make a meal plan that is best for you. Your meal plan may vary depending on factors such as:  The calories you need.  The medicines you take.  Your weight.  Your blood glucose, blood pressure, and cholesterol levels.  Your activity level.  Other health conditions you have, such as heart or kidney disease. How do carbohydrates affect me? Carbohydrates, also called carbs, affect your blood glucose level more than any other type of food. Eating carbs naturally raises the amount of glucose in your blood. Carb counting is a method for keeping track of how many carbs you eat. Counting carbs is important to keep your blood glucose at a healthy level, especially if you use insulin or take certain oral diabetes medicines. It is important to know how many carbs you can safely have in each meal. This is different for every person. Your dietitian can help you calculate how many carbs you should have at each meal and for each snack. Foods that contain carbs include:  Bread, cereal, rice, pasta, and crackers.  Potatoes and corn.  Peas, beans, and lentils.  Milk and yogurt.  Fruit and  juice.  Desserts, such as cakes, cookies, ice cream, and candy. How does alcohol affect me? Alcohol can cause a sudden decrease in blood glucose (hypoglycemia), especially if you use insulin or take certain oral diabetes medicines. Hypoglycemia can be a life-threatening condition. Symptoms of hypoglycemia (sleepiness, dizziness, and confusion) are similar to symptoms of having too much alcohol. If your health care provider says that alcohol is safe for you, follow these guidelines:  Limit alcohol intake to no more than 1 drink per day for nonpregnant women and 2 drinks per day for men. One drink equals 12 oz of beer, 5 oz of wine, or 1 oz of hard liquor.  Do not drink on an empty stomach.  Keep yourself hydrated with water, diet soda, or unsweetened iced tea.  Keep in mind that regular soda, juice, and other mixers may contain a lot of sugar and must be counted as carbs. What are tips for following this plan?  Reading food labels  Start by checking the serving size on the "Nutrition Facts" label of packaged foods and drinks. The amount of calories, carbs, fats, and other nutrients listed on the label is based on one serving of the item. Many items contain more than one serving per package.  Check the total grams (g) of carbs in one serving. You can calculate the number of servings of carbs in one serving by dividing the total carbs by 15. For example, if a food has 30 g of total carbs, it would be equal to 2 servings of carbs.  Check the number of  grams (g) of saturated and trans fats in one serving. Choose foods that have low or no amount of these fats.  Check the number of milligrams (mg) of salt (sodium) in one serving. Most people should limit total sodium intake to less than 2,300 mg per day.  Always check the nutrition information of foods labeled as "low-fat" or "nonfat". These foods may be higher in added sugar or refined carbs and should be avoided.  Talk to your dietitian to  identify your daily goals for nutrients listed on the label. Shopping  Avoid buying canned, premade, or processed foods. These foods tend to be high in fat, sodium, and added sugar.  Shop around the outside edge of the grocery store. This includes fresh fruits and vegetables, bulk grains, fresh meats, and fresh dairy. Cooking  Use low-heat cooking methods, such as baking, instead of high-heat cooking methods like deep frying.  Cook using healthy oils, such as olive, canola, or sunflower oil.  Avoid cooking with butter, cream, or high-fat meats. Meal planning  Eat meals and snacks regularly, preferably at the same times every day. Avoid going long periods of time without eating.  Eat foods high in fiber, such as fresh fruits, vegetables, beans, and whole grains. Talk to your dietitian about how many servings of carbs you can eat at each meal.  Eat 4-6 ounces (oz) of lean protein each day, such as lean meat, chicken, fish, eggs, or tofu. One oz of lean protein is equal to: ? 1 oz of meat, chicken, or fish. ? 1 egg. ?  cup of tofu.  Eat some foods each day that contain healthy fats, such as avocado, nuts, seeds, and fish. Lifestyle  Check your blood glucose regularly.  Exercise regularly as told by your health care provider. This may include: ? 150 minutes of moderate-intensity or vigorous-intensity exercise each week. This could be brisk walking, biking, or water aerobics. ? Stretching and doing strength exercises, such as yoga or weightlifting, at least 2 times a week.  Take medicines as told by your health care provider.  Do not use any products that contain nicotine or tobacco, such as cigarettes and e-cigarettes. If you need help quitting, ask your health care provider.  Work with a Social worker or diabetes educator to identify strategies to manage stress and any emotional and social challenges. Questions to ask a health care provider  Do I need to meet with a diabetes  educator?  Do I need to meet with a dietitian?  What number can I call if I have questions?  When are the best times to check my blood glucose? Where to find more information:  American Diabetes Association: diabetes.org  Academy of Nutrition and Dietetics: www.eatright.[employer] of Diabetes and Digestive and Kidney Diseases (NIH): [employer] Summary  A healthy meal plan will help you control your blood glucose and maintain a healthy lifestyle.  Working with a diet and nutrition specialist (dietitian) can help you make a meal plan that is best for you.  Keep in mind that carbohydrates (carbs) and alcohol have immediate effects on your blood glucose levels. It is important to count carbs and to use alcohol carefully. This information is not intended to replace advice given to you by your health care provider. Make sure you discuss any questions you have with your health care provider. Document Revised: 06/26/2017 Document Reviewed: 08/18/2016 Elsevier Patient Education  [employer].     If you have lab work done today you will  be contacted with your lab results within the next 2 weeks.  If you have not heard from [place] then please contact us. The fastest way to get your results is to register for My Chart.   IF you received an x-ray today, you will receive an invoice from [facility]. Please contact [facility] Radiology at [phone] with questions or concerns regarding your invoice.   IF you received labwork today, you will receive an invoice from [place]. Please contact [facility] at [phone] with questions or concerns regarding your invoice.   Our billing staff will not be able to assist you with questions regarding bills from these companies.  You will be contacted with the lab results as soon as they are available. The fastest way to get your results is to activate your My Chart account. Instructions are located on the last page of this  paperwork. If you have not heard from [place] regarding the results in 2 weeks, please contact this office.

## 2020-06-27 NOTE — Assessment & Plan Note (Signed)
Uncontrolled diabetes with hemoglobin A1c of 7.8. Increase insulin glargine to 28 units daily. Continue monitoring home blood glucose readings. Diet and nutrition discussed. Follow-up in 3 months.

## 2020-07-19 ENCOUNTER — Other Ambulatory Visit: Payer: Self-pay

## 2020-07-19 ENCOUNTER — Ambulatory Visit (INDEPENDENT_AMBULATORY_CARE_PROVIDER_SITE_OTHER): Payer: Self-pay | Admitting: Podiatry

## 2020-07-19 DIAGNOSIS — B351 Tinea unguium: Secondary | ICD-10-CM

## 2020-07-19 DIAGNOSIS — M79675 Pain in left toe(s): Secondary | ICD-10-CM

## 2020-07-19 DIAGNOSIS — M79674 Pain in right toe(s): Secondary | ICD-10-CM

## 2020-07-19 DIAGNOSIS — E1142 Type 2 diabetes mellitus with diabetic polyneuropathy: Secondary | ICD-10-CM

## 2020-07-27 ENCOUNTER — Encounter: Payer: Self-pay | Admitting: Podiatry

## 2020-07-27 NOTE — Progress Notes (Signed)
Subjective:  Patient ID: Alexander Cochran, male    DOB: [birth year],  MRN: [id number]  64 y.o. male presents with preventative diabetic foot care and painful thick toenails that are difficult to trim. Pain interferes with ambulation. Aggravating factors include wearing enclosed shoe gear. Pain is relieved with periodic professional debridement.   He states blood sugar was 59 mg/dL yesterday morning.  States he felt weak but felt better after having breakfast.    Patient's PCP is Dr. Eilleen Kempf Cochran and last visit was November 2021.  He voices no new pedal concerns on today's visit.  Review of Systems: Negative except as noted in the HPI.  Past Medical History:  Diagnosis Date  . Hyperlipidemia   . Hypertension    Past Surgical History:  Procedure Laterality Date  . CYST EXCISION PERINEAL  1984   Patient Active Problem List   Diagnosis Date Noted  . Type 1 diabetes (HCC) 03/13/2020  . Diabetic polyneuropathy associated with type 2 diabetes mellitus (HCC) 09/26/2019  . Type 2 diabetes mellitus with hyperglycemia (HCC) 09/26/2019  . Dyslipidemia associated with type 2 diabetes mellitus (HCC) 09/26/2019  . Stage 3a chronic kidney disease (HCC) 09/26/2019  . Hyperosmolar hyperglycemic coma due to diabetes mellitus without ketoacidosis (HCC) 08/30/2018  . Left renal mass 08/30/2018  . Mass of thyroid region 08/30/2018  . Type 2 MI (myocardial infarction) (HCC) 08/30/2018  . Acute on chronic kidney failure (HCC) 08/28/2018  . Morbid obesity (HCC) 08/28/2018  . Severe acute pancreatitis 08/28/2018  . BMI 38.0-38.9,adult 10/21/2011  . 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 Hyperlipemia 10/21/2011    Current Outpatient Medications:  .  B-D ULTRAFINE III SHORT PEN 31G X 8 MM MISC, USE AS DIRECTED, Disp: 100 each, Rfl: 1 .  clotrimazole (LOTRIMIN) 1 % cream, Apply to both feet and between toes twice daily for 6 weeks., Disp: 60 g, Rfl: 1 .  Continuous Blood Gluc Sensor  (FREESTYLE LIBRE 14 DAY SENSOR) MISC, 1 each by Does not apply route every 14 (fourteen) days. For use with continuous glucose monitoring system; Change sensor every 14 days; E11.9, Disp: 6 each, Rfl: 2 .  gabapentin (NEURONTIN) 300 MG capsule, TAKE 1 CAPSULE BY MOUTH TWICE A DAY, Disp: 180 capsule, Rfl: 3 .  insulin glargine (LANTUS SOLOSTAR) 100 UNIT/ML Solostar Pen, Inject 24 Units into the skin every morning. And pen needles 1/day, Disp: 15 mL, Rfl: 11 .  lisinopril-hydrochlorothiazide (ZESTORETIC) 20-25 MG tablet, TAKE 1 TABLET BY MOUTH EVERY DAY, Disp: 90 tablet, Rfl: 3 .  simvastatin (ZOCOR) 40 MG tablet, TAKE 1 TABLET BY MOUTH EVERY DAY, Disp: 90 tablet, Rfl: 1 No Known Allergies Social History   Tobacco Use  Smoking Status Former Smoker  . Packs/day: 0.25  . Years: 2.00  . Pack years: 0.50  . Quit date: 12/28/1973  . Years since quitting: 46.6  Smokeless Tobacco Never Used   Objective:  There were no vitals filed for this visit. Constitutional Patient is a pleasant 64 y.o. African American male in NAD.Marland Kitchen AAO x 3.  Vascular Capillary refill time to digits immediate b/l. Capillary fill time to digits delayed b/l lower extremities. Faintly palpable DP pulse(s) b/l lower extremities. Faintly palpable PT pulse(s) b/l lower extremities. Pedal hair sparse. Lower extremity skin temperature gradient within normal limits. No cyanosis or clubbing noted.  Neurologic Normal speech. Oriented to person, place, and time. Protective sensation diminished with 10g monofilament b/l. Vibratory sensation diminished b/l.  Dermatologic Pedal skin with normal turgor, texture  and tone bilaterally. No open wounds bilaterally. No interdigital macerations bilaterally. Toenails 1-5 b/l elongated, discolored, dystrophic, thickened, crumbly with subungual debris and tenderness to dorsal palpation.   Orthopedic: Normal muscle strength 5/5 to all lower extremity muscle groups bilaterally. No pain crepitus or joint  limitation noted with ROM b/l. Pes planus deformity noted b/l.    Hemoglobin A1C Latest Ref Rng & Units 06/27/2020 06/06/2020 04/06/2020 02/24/2020 01/19/2020  HGBA1C 4.0 - 5.6 % 7.8(A) 7.0(A) 6.3(A) 6.1(A) 5.9(A)  Some recent data might be hidden   Assessment:   1. Pain due to onychomycosis of toenails of both feet   2. Diabetic peripheral neuropathy associated with type 2 diabetes mellitus ([place])    Plan:  Patient was evaluated and treated and all questions answered.  Onychomycosis with pain -Nails palliatively debridement as below. -Educated on self-care  Procedure: Nail Debridement Rationale: Pain Type of Debridement: manual, sharp debridement. Instrumentation: Nail nipper, rotary burr. Number of Nails: 10  -Examined patient. -Continue diabetic foot care principles. -Toenails 1-5 b/l were debrided in length and girth with sterile nail nippers and dremel without iatrogenic bleeding.  -Patient to report any pedal injuries to medical professional immediately. -Patient to continue soft, supportive shoe gear daily. -Patient/POA to call should there be question/concern in the interim.  Return in about 3 months (around 10/17/2020) for diabetic foot care.  [name], DPM

## 2020-08-16 ENCOUNTER — Other Ambulatory Visit: Payer: Self-pay | Admitting: Emergency Medicine

## 2020-08-16 DIAGNOSIS — E785 Hyperlipidemia, unspecified: Secondary | ICD-10-CM

## 2020-09-12 ENCOUNTER — Other Ambulatory Visit: Payer: Self-pay | Admitting: Emergency Medicine

## 2020-09-12 ENCOUNTER — Ambulatory Visit: Payer: Medicaid Other | Admitting: Endocrinology

## 2020-09-12 DIAGNOSIS — E1142 Type 2 diabetes mellitus with diabetic polyneuropathy: Secondary | ICD-10-CM

## 2020-09-12 NOTE — Telephone Encounter (Signed)
Future visit in 1 week  

## 2020-09-25 ENCOUNTER — Ambulatory Visit (INDEPENDENT_AMBULATORY_CARE_PROVIDER_SITE_OTHER): Payer: Self-pay | Admitting: Emergency Medicine

## 2020-09-25 ENCOUNTER — Encounter: Payer: Self-pay | Admitting: Emergency Medicine

## 2020-09-25 ENCOUNTER — Other Ambulatory Visit: Payer: Self-pay

## 2020-09-25 VITALS — BP 138/80 | HR 94 | Temp 98.3°F | Resp 16 | Ht 66.0 in | Wt 263.0 lb

## 2020-09-25 DIAGNOSIS — E1142 Type 2 diabetes mellitus with diabetic polyneuropathy: Secondary | ICD-10-CM

## 2020-09-25 DIAGNOSIS — D573 Sickle-cell trait: Secondary | ICD-10-CM

## 2020-09-25 DIAGNOSIS — E1165 Type 2 diabetes mellitus with hyperglycemia: Secondary | ICD-10-CM

## 2020-09-25 DIAGNOSIS — E1169 Type 2 diabetes mellitus with other specified complication: Secondary | ICD-10-CM

## 2020-09-25 DIAGNOSIS — N1831 Chronic kidney disease, stage 3a: Secondary | ICD-10-CM

## 2020-09-25 DIAGNOSIS — E785 Hyperlipidemia, unspecified: Secondary | ICD-10-CM

## 2020-09-25 DIAGNOSIS — Z794 Long term (current) use of insulin: Secondary | ICD-10-CM

## 2020-09-25 DIAGNOSIS — I152 Hypertension secondary to endocrine disorders: Secondary | ICD-10-CM

## 2020-09-25 DIAGNOSIS — E1159 Type 2 diabetes mellitus with other circulatory complications: Secondary | ICD-10-CM

## 2020-09-25 LAB — POCT GLYCOSYLATED HEMOGLOBIN (HGB A1C): Hemoglobin A1C: 9.9 % — AB (ref 4.0–5.6)

## 2020-09-25 LAB — GLUCOSE, POCT (MANUAL RESULT ENTRY): POC Glucose: 183 mg/dl — AB (ref 70–99)

## 2020-09-25 MED ORDER — LANTUS SOLOSTAR 100 UNIT/ML ~~LOC~~ SOPN: 30.0000 [IU] | PEN_INJECTOR | Freq: Every day | SUBCUTANEOUS | 11 refills | Status: DC

## 2020-09-25 MED ORDER — BD PEN NEEDLE SHORT U/F 31G X 8 MM MISC: 1 refills | Status: DC

## 2020-09-25 MED ORDER — SITAGLIPTIN PHOSPHATE 50 MG PO TABS: 50.0000 mg | ORAL_TABLET | Freq: Every day | ORAL | 5 refills | Status: DC

## 2020-09-25 NOTE — Progress Notes (Addendum)
Alexander Cochran 65 y.o.   Chief Complaint  Patient presents with  . Diabetes    Follow up 3 month  . Hypertension       . Medication Refill    Lantus Solostar and insulin pen needle    HISTORY OF PRESENT ILLNESS: This is a 65 y.o. male with history of diabetes and hypertension here for follow-up of medication refill. Blood sugars at home have been elevated. Presently taking 28 units of insulin glargine at 9 PM. Blood pressure readings at home within normal limits. Continues to take simvastatin 40 mg daily. Has no complaints or medical concerns today. Lab Results  Component Value Date   HGBA1C 7.8 (A) 06/27/2020    HPI   Prior to Admission medications   Medication Sig Start Date End Date Taking? Authorizing Provider  gabapentin (NEURONTIN) 300 MG capsule TAKE 1 CAPSULE BY MOUTH TWICE A DAY 09/12/20  Yes Der Gagliano, Ines Bloomer, MD  insulin glargine (LANTUS SOLOSTAR) 100 UNIT/ML Solostar Pen Inject 24 Units into the skin every morning. And pen needles 1/day 06/06/20  Yes Renato Shin, MD  lisinopril-hydrochlorothiazide (ZESTORETIC) 20-25 MG tablet TAKE 1 TABLET BY MOUTH EVERY DAY 11/16/19  Yes [name], MD  simvastatin (ZOCOR) 40 MG tablet TAKE 1 TABLET BY MOUTH EVERY DAY 08/16/20  Yes Mattye Verdone, Ines Bloomer, MD  B-D ULTRAFINE III SHORT PEN 31G X 8 MM MISC USE AS DIRECTED 03/30/20   [name], MD  clotrimazole (LOTRIMIN) 1 % cream Apply to both feet and between toes twice daily for 6 weeks. Patient not taking: Reported on 09/25/2020 04/19/20   [name], DPM  Continuous Blood Gluc Sensor (FREESTYLE LIBRE 14 DAY SENSOR) MISC 1 each by Does not apply route every 14 (fourteen) days. For use with continuous glucose monitoring system; Change sensor every 14 days; E11.9 01/19/20   Renato Shin, MD    No Known Allergies  Patient Active Problem List   Diagnosis Date Noted  . Type 1 diabetes ([place]) 03/13/2020  . Diabetic polyneuropathy associated with type  2 diabetes mellitus ([place]) 09/26/2019  . Type 2 diabetes mellitus with hyperglycemia ([place]) 09/26/2019  . Dyslipidemia associated with type 2 diabetes mellitus ([place]) 09/26/2019  . Stage 3a chronic kidney disease ([place]) 09/26/2019  . Hyperosmolar hyperglycemic coma due to diabetes mellitus without ketoacidosis ([place]) 08/30/2018  . Left renal mass 08/30/2018  . Mass of thyroid region 08/30/2018  . Type 2 MI (myocardial infarction) ([place]) 08/30/2018  . Acute on chronic kidney failure ([place]) 08/28/2018  . Morbid obesity ([place]) 08/28/2018  . Severe acute pancreatitis 08/28/2018  . BMI 38.0-38.9,adult 10/21/2011  . 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[place]) 10/21/2011  . Hyperlipemia 10/21/2011    Past Medical History:  Diagnosis Date  . Hyperlipidemia   . Hypertension     Past Surgical History:  Procedure Laterality Date  . CYST EXCISION PERINEAL  1984    Social History   Socioeconomic History  . Marital status: Married    Spouse name: Not on file  . Number of children: Not on file  . Years of education: Not on file  . Highest education level: Not on file  Occupational History  . Not on file  Tobacco Use  . Smoking status: Former Smoker    Packs/day: 0.25    Years: 2.00    Pack years: 0.50    Quit date: 12/28/1973    Years since quitting: 46.7  . Smokeless tobacco: Never Used  Substance and Sexual Activity  . Alcohol  use: Yes  . Drug use: No  . Sexual activity: Never  Other Topics Concern  . Not on file  Social History Narrative  . Not on file   Social Determinants of Health   Financial Resource Strain: Not on file  Food Insecurity: Not on file  Transportation Needs: Not on file  Physical Activity: Not on file  Stress: Not on file  Social Connections: Not on file  Intimate Partner Violence: Not on file    Family History  Problem Relation Age of Onset  . Diabetes Mother   . Hypertension Sister   . Hypertension Brother      Review of Systems  Constitutional: Negative.   Negative for chills and fever.  HENT: Negative.  Negative for congestion and sore throat.   Respiratory: Negative.  Negative for cough and shortness of breath.   Cardiovascular: Negative.  Negative for chest pain and palpitations.  Gastrointestinal: Negative.  Negative for abdominal pain, nausea and vomiting.  Genitourinary: Negative.   Musculoskeletal: Negative.   Skin: Negative.   Neurological: Negative.  Negative for dizziness and headaches.  All other systems reviewed and are negative.   Vitals:   09/25/20 1025  BP: 138/80  Pulse: 94  Resp: 16  Temp: 98.3 F (36.8 C)  SpO2: 97%    Body mass index is 42.45 kg/m. Wt Readings from Last 3 Encounters:  09/25/20 263 lb (119.3 kg)  06/27/20 267 lb (121.1 kg)  06/06/20 272 lb (123.4 kg)    Physical Exam Vitals reviewed.  Constitutional:      Appearance: He is obese.  HENT:     Head: Normocephalic.  Eyes:     Extraocular Movements: Extraocular movements intact.     Pupils: Pupils are equal, round, and reactive to light.  Cardiovascular:     Rate and Rhythm: Normal rate and regular rhythm.     Pulses: Normal pulses.     Heart sounds: Normal heart sounds.  Pulmonary:     Effort: Pulmonary effort is normal.     Breath sounds: Normal breath sounds.  Musculoskeletal:        General: Normal range of motion.     Cervical back: Normal range of motion.  Skin:    General: Skin is warm and dry.  Neurological:     General: No focal deficit present.     Mental Status: He is alert and oriented to person, place, and time.  Psychiatric:        Mood and Affect: Mood normal.        Behavior: Behavior normal.    Results for orders placed or performed in visit on 09/25/20 (from the past 24 hour(s))  POCT glucose (manual entry)     Status: Abnormal   Collection Time: 09/25/20 10:25 AM  Result Value Ref Range   POC Glucose 183 (A) 70 - 99 mg/dl  POCT glycosylated hemoglobin (Hb A1C)     Status: Abnormal   Collection Time:  09/25/20 10:33 AM  Result Value Ref Range   Hemoglobin A1C 9.9 (A) 4.0 - 5.6 %   HbA1c POC (<> result, manual entry)     HbA1c, POC (prediabetic range)     HbA1c, POC (controlled diabetic range)       ASSESSMENT & PLAN: Type 2 diabetes mellitus with hyperglycemia (HCC) Uncontrolled diabetes with hemoglobin A1c at 9.9.  Presently taking 28 units of insulin glargine at 9 PM.  Advised to increase insulin to 30 units. Has history of pancreatitis.  Will avoid  GLP's. Past history of renal insufficiency.  Will avoid Metformin and SGLT's.  Last GFR of 47. We will start Januvia 50 mg daily. Advised to follow-up with endocrinologist, Dr. Loanne Drilling, in the next couple of weeks. Diet and nutrition discussed. Follow-up in 3 months.  [place] was seen today for diabetes, hypertension and medication refill.  Diagnoses and all orders for this visit:  Hypertension associated with type 2 diabetes mellitus ([place]) -     POCT glucose (manual entry) -     POCT glycosylated hemoglobin (Hb A1C)  Dyslipidemia associated with type 2 diabetes mellitus (HCC)  Stage 3a chronic kidney disease ([place])  Diabetic polyneuropathy associated with type 2 diabetes mellitus (HCC)  Type 2 diabetes mellitus with hyperglycemia, with long-term current use of insulin (HCC) -     insulin glargine (LANTUS SOLOSTAR) 100 UNIT/ML Solostar Pen; Inject 30 Units into the skin daily. And pen needles 1/day -     Insulin Pen Needle (B-D ULTRAFINE III SHORT PEN) 31G X 8 MM MISC; USE AS DIRECTED -     sitaGLIPtin (JANUVIA) 50 MG tablet; Take 1 tablet (50 mg total) by mouth daily.  Morbid obesity ([place])  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[place])     Patient Instructions       If you have lab work done today you will be contacted with your lab results within the next 2 weeks.  If you have not heard from [place] then please contact us. The fastest way to get your results is to register for My Chart.   IF you received an x-ray today, you will  receive an invoice from [facility]. Please contact [facility] at [phone] with questions or concerns regarding your invoice.   IF you received labwork today, you will receive an invoice from [facility]. Please contact [facility] at [phone] with questions or concerns regarding your invoice.   Our billing staff will not be able to assist you with questions regarding bills from these companies.  You will be contacted with the lab results as soon as they are available. The fastest way to get your results is to activate your My Chart account. Instructions are located on the last page of this paperwork. If you have not heard from [place] regarding the results in 2 weeks, please contact this office.     Diabetes Mellitus and Nutrition, Adult When you have diabetes, or diabetes mellitus, it is very important to have healthy eating habits because your blood sugar (glucose) levels are greatly affected by what you eat and drink. Eating healthy foods in the right amounts, at about the same times every day, can help you:  Control your blood glucose.  Lower your risk of heart disease.  Improve your blood pressure.  Reach or maintain a healthy weight. What can affect my meal plan? Every person with diabetes is different, and each person has different needs for a meal plan. Your health care provider may recommend that you work with a dietitian to make a meal plan that is best for you. Your meal plan may vary depending on factors such as:  The calories you need.  The medicines you take.  Your weight.  Your blood glucose, blood pressure, and cholesterol levels.  Your activity level.  Other health conditions you have, such as heart or kidney disease. How do carbohydrates affect me? Carbohydrates, also called carbs, affect your blood glucose level more than any other type of food. Eating carbs naturally raises the amount of glucose in your blood. Carb counting  is a method for  keeping track of how many carbs you eat. Counting carbs is important to keep your blood glucose at a healthy level, especially if you use insulin or take certain oral diabetes medicines. It is important to know how many carbs you can safely have in each meal. This is different for every person. Your dietitian can help you calculate how many carbs you should have at each meal and for each snack. How does alcohol affect me? Alcohol can cause a sudden decrease in blood glucose (hypoglycemia), especially if you use insulin or take certain oral diabetes medicines. Hypoglycemia can be a life-threatening condition. Symptoms of hypoglycemia, such as sleepiness, dizziness, and confusion, are similar to symptoms of having too much alcohol.  Do not drink alcohol if: ? Your health care provider tells you not to drink. ? You are pregnant, may be pregnant, or are planning to become pregnant.  If you drink alcohol: ? Do not drink on an empty stomach. ? Limit how much you use to:  0-1 drink a day for women.  0-2 drinks a day for men. ? Be aware of how much alcohol is in your drink. In the [place] one drink equals one 12 oz bottle of beer (355 mL), one 5 oz glass of wine (148 mL), or one 1 oz glass of hard liquor (44 mL). ? Keep yourself hydrated with water, diet soda, or unsweetened iced tea.  Keep in mind that regular soda, juice, and other mixers may contain a lot of sugar and must be counted as carbs. What are tips for following this plan? Reading food labels  Start by checking the serving size on the "Nutrition Facts" label of packaged foods and drinks. The amount of calories, carbs, fats, and other nutrients listed on the label is based on one serving of the item. Many items contain more than one serving per package.  Check the total grams (g) of carbs in one serving. You can calculate the number of servings of carbs in one serving by dividing the total carbs by 15. For example, if a food has 30 g of  total carbs per serving, it would be equal to 2 servings of carbs.  Check the number of grams (g) of saturated fats and trans fats in one serving. Choose foods that have a low amount or none of these fats.  Check the number of milligrams (mg) of salt (sodium) in one serving. Most people should limit total sodium intake to less than 2,300 mg per day.  Always check the nutrition information of foods labeled as "low-fat" or "nonfat." These foods may be higher in added sugar or refined carbs and should be avoided.  Talk to your dietitian to identify your daily goals for nutrients listed on the label. Shopping  Avoid buying canned, pre-made, or processed foods. These foods tend to be high in fat, sodium, and added sugar.  Shop around the outside edge of the grocery store. This is where you will most often find fresh fruits and vegetables, bulk grains, fresh meats, and fresh dairy. Cooking  Use low-heat cooking methods, such as baking, instead of high-heat cooking methods like deep frying.  Cook using healthy oils, such as olive, canola, or sunflower oil.  Avoid cooking with butter, cream, or high-fat meats. Meal planning  Eat meals and snacks regularly, preferably at the same times every day. Avoid going long periods of time without eating.  Eat foods that are high in fiber, such as fresh fruits,  vegetables, beans, and whole grains. Talk with your dietitian about how many servings of carbs you can eat at each meal.  Eat 4-6 oz (112-168 g) of lean protein each day, such as lean meat, chicken, fish, eggs, or tofu. One ounce (oz) of lean protein is equal to: ? 1 oz (28 g) of meat, chicken, or fish. ? 1 egg. ?  cup (62 g) of tofu.  Eat some foods each day that contain healthy fats, such as avocado, nuts, seeds, and fish.   What foods should I eat? Fruits Berries. Apples. Oranges. Peaches. Apricots. Plums. Grapes. Mango. Papaya. Pomegranate. Kiwi. Cherries. Vegetables Lettuce. Spinach.  Leafy greens, including kale, chard, collard greens, and mustard greens. Beets. Cauliflower. Cabbage. Broccoli. Carrots. Green beans. Tomatoes. Peppers. Onions. Cucumbers. Brussels sprouts. Grains Whole grains, such as whole-wheat or whole-grain bread, crackers, tortillas, cereal, and pasta. Unsweetened oatmeal. Quinoa. Brown or wild rice. Meats and other proteins Seafood. Poultry without skin. Lean cuts of poultry and beef. Tofu. Nuts. Seeds. Dairy Low-fat or fat-free dairy products such as milk, yogurt, and cheese. The items listed above may not be a complete list of foods and beverages you can eat. Contact a dietitian for more information. What foods should I avoid? Fruits Fruits canned with syrup. Vegetables Canned vegetables. Frozen vegetables with butter or cream sauce. Grains Refined white flour and flour products such as bread, pasta, snack foods, and cereals. Avoid all processed foods. Meats and other proteins Fatty cuts of meat. Poultry with skin. Breaded or fried meats. Processed meat. Avoid saturated fats. Dairy Full-fat yogurt, cheese, or milk. Beverages Sweetened drinks, such as soda or iced tea. The items listed above may not be a complete list of foods and beverages you should avoid. Contact a dietitian for more information. Questions to ask a health care provider  Do I need to meet with a diabetes educator?  Do I need to meet with a dietitian?  What number can I call if I have questions?  When are the best times to check my blood glucose? Where to find more information:  American Diabetes Association: diabetes.org  Academy of Nutrition and Dietetics: www.eatright.[employer] of Diabetes and Digestive and Kidney Diseases: [employer]  Association of Diabetes Care and Education Specialists: www.diabeteseducator.org Summary  It is important to have healthy eating habits because your blood sugar (glucose) levels are greatly affected by what you  eat and drink.  A healthy meal plan will help you control your blood glucose and maintain a healthy lifestyle.  Your health care provider may recommend that you work with a dietitian to make a meal plan that is best for you.  Keep in mind that carbohydrates (carbs) and alcohol have immediate effects on your blood glucose levels. It is important to count carbs and to use alcohol carefully. This information is not intended to replace advice given to you by your health care provider. Make sure you discuss any questions you have with your health care provider. Document Revised: 06/21/2019 Document Reviewed: 06/21/2019 Elsevier Patient Education  2021 Elsevier Inc.      Agustina Caroli, MD [facility]

## 2020-09-25 NOTE — Patient Instructions (Addendum)
   If you have lab work done today you will be contacted with your lab results within the next 2 weeks.  If you have not heard from us then please contact us. The fastest way to get your results is to register for My Chart.   IF you received an x-ray today, you will receive an invoice from [facility]. Please contact [facility] at [phone] with questions or concerns regarding your invoice.   IF you received labwork today, you will receive an invoice from [facility]. Please contact [facility] at [phone] with questions or concerns regarding your invoice.   Our billing staff will not be able to assist you with questions regarding bills from these companies.  You will be contacted with the lab results as soon as they are available. The fastest way to get your results is to activate your My Chart account. Instructions are located on the last page of this paperwork. If you have not heard from us regarding the results in 2 weeks, please contact this office.     Diabetes Mellitus and Nutrition, Adult When you have diabetes, or diabetes mellitus, it is very important to have healthy eating habits because your blood sugar (glucose) levels are greatly affected by what you eat and drink. Eating healthy foods in the right amounts, at about the same times every day, can help you:  Control your blood glucose.  Lower your risk of heart disease.  Improve your blood pressure.  Reach or maintain a healthy weight. What can affect my meal plan? Every person with diabetes is different, and each person has different needs for a meal plan. Your health care provider may recommend that you work with a dietitian to make a meal plan that is best for you. Your meal plan may vary depending on factors such as:  The calories you need.  The medicines you take.  Your weight.  Your blood glucose, blood pressure, and cholesterol levels.  Your activity level.  Other health conditions you  have, such as heart or kidney disease. How do carbohydrates affect me? Carbohydrates, also called carbs, affect your blood glucose level more than any other type of food. Eating carbs naturally raises the amount of glucose in your blood. Carb counting is a method for keeping track of how many carbs you eat. Counting carbs is important to keep your blood glucose at a healthy level, especially if you use insulin or take certain oral diabetes medicines. It is important to know how many carbs you can safely have in each meal. This is different for every person. Your dietitian can help you calculate how many carbs you should have at each meal and for each snack. How does alcohol affect me? Alcohol can cause a sudden decrease in blood glucose (hypoglycemia), especially if you use insulin or take certain oral diabetes medicines. Hypoglycemia can be a life-threatening condition. Symptoms of hypoglycemia, such as sleepiness, dizziness, and confusion, are similar to symptoms of having too much alcohol.  Do not drink alcohol if: ? Your health care provider tells you not to drink. ? You are pregnant, may be pregnant, or are planning to become pregnant.  If you drink alcohol: ? Do not drink on an empty stomach. ? Limit how much you use to:  0-1 drink a day for women.  0-2 drinks a day for men. ? Be aware of how much alcohol is in your drink. In the [place] one drink equals one 12 oz bottle of beer (355 mL),   one 5 oz glass of wine (148 mL), or one 1 oz glass of hard liquor (44 mL). ? Keep yourself hydrated with water, diet soda, or unsweetened iced tea.  Keep in mind that regular soda, juice, and other mixers may contain a lot of sugar and must be counted as carbs. What are tips for following this plan? Reading food labels  Start by checking the serving size on the "Nutrition Facts" label of packaged foods and drinks. The amount of calories, carbs, fats, and other nutrients listed on the label is based on  one serving of the item. Many items contain more than one serving per package.  Check the total grams (g) of carbs in one serving. You can calculate the number of servings of carbs in one serving by dividing the total carbs by 15. For example, if a food has 30 g of total carbs per serving, it would be equal to 2 servings of carbs.  Check the number of grams (g) of saturated fats and trans fats in one serving. Choose foods that have a low amount or none of these fats.  Check the number of milligrams (mg) of salt (sodium) in one serving. Most people should limit total sodium intake to less than 2,300 mg per day.  Always check the nutrition information of foods labeled as "low-fat" or "nonfat." These foods may be higher in added sugar or refined carbs and should be avoided.  Talk to your dietitian to identify your daily goals for nutrients listed on the label. Shopping  Avoid buying canned, pre-made, or processed foods. These foods tend to be high in fat, sodium, and added sugar.  Shop around the outside edge of the grocery store. This is where you will most often find fresh fruits and vegetables, bulk grains, fresh meats, and fresh dairy. Cooking  Use low-heat cooking methods, such as baking, instead of high-heat cooking methods like deep frying.  Cook using healthy oils, such as olive, canola, or sunflower oil.  Avoid cooking with butter, cream, or high-fat meats. Meal planning  Eat meals and snacks regularly, preferably at the same times every day. Avoid going long periods of time without eating.  Eat foods that are high in fiber, such as fresh fruits, vegetables, beans, and whole grains. Talk with your dietitian about how many servings of carbs you can eat at each meal.  Eat 4-6 oz (112-168 g) of lean protein each day, such as lean meat, chicken, fish, eggs, or tofu. One ounce (oz) of lean protein is equal to: ? 1 oz (28 g) of meat, chicken, or fish. ? 1 egg. ?  cup (62 g) of  tofu.  Eat some foods each day that contain healthy fats, such as avocado, nuts, seeds, and fish.   What foods should I eat? Fruits Berries. Apples. Oranges. Peaches. Apricots. Plums. Grapes. Mango. Papaya. Pomegranate. Kiwi. Cherries. Vegetables Lettuce. Spinach. Leafy greens, including kale, chard, collard greens, and mustard greens. Beets. Cauliflower. Cabbage. Broccoli. Carrots. Green beans. Tomatoes. Peppers. Onions. Cucumbers. Brussels sprouts. Grains Whole grains, such as whole-wheat or whole-grain bread, crackers, tortillas, cereal, and pasta. Unsweetened oatmeal. Quinoa. Brown or wild rice. Meats and other proteins Seafood. Poultry without skin. Lean cuts of poultry and beef. Tofu. Nuts. Seeds. Dairy Low-fat or fat-free dairy products such as milk, yogurt, and cheese. The items listed above may not be a complete list of foods and beverages you can eat. Contact a dietitian for more information. What foods should I avoid? Fruits Fruits canned   with syrup. Vegetables Canned vegetables. Frozen vegetables with butter or cream sauce. Grains Refined white flour and flour products such as bread, pasta, snack foods, and cereals. Avoid all processed foods. Meats and other proteins Fatty cuts of meat. Poultry with skin. Breaded or fried meats. Processed meat. Avoid saturated fats. Dairy Full-fat yogurt, cheese, or milk. Beverages Sweetened drinks, such as soda or iced tea. The items listed above may not be a complete list of foods and beverages you should avoid. Contact a dietitian for more information. Questions to ask a health care provider  Do I need to meet with a diabetes educator?  Do I need to meet with a dietitian?  What number can I call if I have questions?  When are the best times to check my blood glucose? Where to find more information:  American Diabetes Association: diabetes.org  Academy of Nutrition and Dietetics: www.eatright.org  National Institute of  Diabetes and Digestive and Kidney Diseases: www.niddk.nih.gov  Association of Diabetes Care and Education Specialists: www.diabeteseducator.org Summary  It is important to have healthy eating habits because your blood sugar (glucose) levels are greatly affected by what you eat and drink.  A healthy meal plan will help you control your blood glucose and maintain a healthy lifestyle.  Your health care provider may recommend that you work with a dietitian to make a meal plan that is best for you.  Keep in mind that carbohydrates (carbs) and alcohol have immediate effects on your blood glucose levels. It is important to count carbs and to use alcohol carefully. This information is not intended to replace advice given to you by your health care provider. Make sure you discuss any questions you have with your health care provider. Document Revised: 06/21/2019 Document Reviewed: 06/21/2019 Elsevier Patient Education  2021 Elsevier Inc.  

## 2020-09-25 NOTE — Assessment & Plan Note (Signed)
Uncontrolled diabetes with hemoglobin A1c at 9.9.  Presently taking 28 units of insulin glargine at 9 PM.  Advised to increase insulin to 30 units. Has history of pancreatitis.  Will avoid GLP's. Past history of renal insufficiency.  Will avoid Metformin and SGLT's.  Last GFR of 47. We will start Januvia 50 mg daily. Advised to follow-up with endocrinologist, Dr. Loanne Drilling, in the next couple of weeks. Diet and nutrition discussed. Follow-up in 3 months.

## 2020-10-18 ENCOUNTER — Other Ambulatory Visit: Payer: Self-pay

## 2020-10-18 ENCOUNTER — Ambulatory Visit (INDEPENDENT_AMBULATORY_CARE_PROVIDER_SITE_OTHER): Payer: Self-pay | Admitting: Podiatry

## 2020-10-18 ENCOUNTER — Encounter: Payer: Self-pay | Admitting: Podiatry

## 2020-10-18 DIAGNOSIS — B351 Tinea unguium: Secondary | ICD-10-CM

## 2020-10-18 DIAGNOSIS — E1142 Type 2 diabetes mellitus with diabetic polyneuropathy: Secondary | ICD-10-CM

## 2020-10-18 DIAGNOSIS — L600 Ingrowing nail: Secondary | ICD-10-CM

## 2020-10-18 DIAGNOSIS — M79675 Pain in left toe(s): Secondary | ICD-10-CM

## 2020-10-18 DIAGNOSIS — M79674 Pain in right toe(s): Secondary | ICD-10-CM

## 2020-10-21 NOTE — Progress Notes (Signed)
  Subjective:  Patient ID: Alexander Cochran, male    DOB: [birth year],  MRN: [id number]  65 y.o. male presents with preventative diabetic foot care and painful thick toenails that are difficult to trim. Pain interferes with ambulation. Aggravating factors include wearing enclosed shoe gear. Pain is relieved with periodic professional debridement.   He states blood sugar was 138 mg/dL this morning.  Patient's PCP is Dr. Ines Bloomer Sagardia and last visit was 09/25/2020.  He voices no new pedal concerns on today's visit.  Review of Systems: Negative except as noted in the HPI.   No Known Allergies   Objective:  There were no vitals filed for this visit. Constitutional Patient is a pleasant 65 y.o. African American male in NAD.Marland Kitchen AAO x 3.  Vascular Capillary refill time to digits immediate b/l. Capillary fill time to digits delayed b/l lower extremities. Faintly palpable DP pulse(s) b/l lower extremities. Faintly palpable PT pulse(s) b/l lower extremities. Pedal hair sparse. Lower extremity skin temperature gradient within normal limits. No cyanosis or clubbing noted.  Neurologic Normal speech. Oriented to person, place, and time. Protective sensation diminished with 10g monofilament b/l. Vibratory sensation diminished b/l.  Dermatologic Pedal skin with normal turgor, texture and tone bilaterally. No open wounds bilaterally. No interdigital macerations bilaterally. Toenails 1-5 b/l elongated, discolored, dystrophic, thickened, crumbly with subungual debris and tenderness to dorsal palpation. Incurvated nailplate L 2nd toe with tenderness to palpation. No erythema, no edema, no drainage noted. No fluctuance.  Orthopedic: Normal muscle strength 5/5 to all lower extremity muscle groups bilaterally. No pain crepitus or joint limitation noted with ROM b/l. Pes planus deformity noted b/l.    Hemoglobin A1C Latest Ref Rng & Units 09/25/2020 06/27/2020 06/06/2020 04/06/2020 02/24/2020  HGBA1C 4.0 - 5.6 % 9.9(A)  7.8(A) 7.0(A) 6.3(A) 6.1(A)  Some recent data might be hidden   Assessment:   1. Pain due to onychomycosis of toenails of both feet   2. Ingrown toenail without infection   3. Diabetic peripheral neuropathy associated with type 2 diabetes mellitus ([place])    Plan:  Patient was evaluated and treated and all questions answered.  Onychomycosis with pain -Nails palliatively debridement as below. -Educated on self-care  Procedure: Nail Debridement Rationale: Pain Type of Debridement: manual, sharp debridement. Instrumentation: Nail nipper, rotary burr. Number of Nails: 10  -Examined patient. -Continue diabetic foot care principles. -Toenails 1-5 b/l were debrided in length and girth with sterile nail nippers and dremel without iatrogenic bleeding.  -Offending nail borders debrided and curretaged L 2nd toe. Borders cleansed with alcohol. Antibiotic ointment applied. He is to apply Neosporin left 2nd toe once daily for one week. -Patient to report any pedal injuries to medical professional immediately. -Patient to continue soft, supportive shoe gear daily. -Patient/POA to call should there be question/concern in the interim.  Return in about 3 months (around 01/18/2021).  [name], DPM

## 2020-10-24 ENCOUNTER — Other Ambulatory Visit: Payer: Self-pay | Admitting: Emergency Medicine

## 2020-10-24 DIAGNOSIS — E785 Hyperlipidemia, unspecified: Secondary | ICD-10-CM

## 2020-10-26 ENCOUNTER — Ambulatory Visit: Payer: Medicaid Other | Admitting: Endocrinology

## 2020-10-26 ENCOUNTER — Other Ambulatory Visit: Payer: Self-pay

## 2020-10-26 VITALS — BP 130/80 | HR 94 | Ht 66.0 in | Wt 266.4 lb

## 2020-10-26 DIAGNOSIS — E1021 Type 1 diabetes mellitus with diabetic nephropathy: Secondary | ICD-10-CM

## 2020-10-26 DIAGNOSIS — Z794 Long term (current) use of insulin: Secondary | ICD-10-CM

## 2020-10-26 DIAGNOSIS — N1831 Chronic kidney disease, stage 3a: Secondary | ICD-10-CM

## 2020-10-26 DIAGNOSIS — E1165 Type 2 diabetes mellitus with hyperglycemia: Secondary | ICD-10-CM

## 2020-10-26 LAB — POCT GLYCOSYLATED HEMOGLOBIN (HGB A1C): Hemoglobin A1C: 8.8 % — AB (ref 4.0–5.6)

## 2020-10-26 MED ORDER — LANTUS SOLOSTAR 100 UNIT/ML ~~LOC~~ SOPN: 35.0000 [IU] | PEN_INJECTOR | SUBCUTANEOUS | 11 refills | Status: DC

## 2020-10-26 NOTE — Patient Instructions (Addendum)
Please increase the insulin to 35 units each morning.   We'll plan to recheck the thyroid ultrasound later this year.   Please come back for a follow-up appointment in 2 months.

## 2020-10-26 NOTE — Progress Notes (Signed)
Subjective:    Patient ID: Alexander Cochran, male    DOB: 12-23-1955, 65 y.o.   MRN: [id number]  HPI Pt returns for f/u of diabetes mellitus:  DM type: 1 Dx'ed: 9702 Complications: stage 3b CRI Therapy: insulin since dx DKA: only once, at dx Severe hypoglycemia: never Pancreatitis: once (2020) Pancreatic imaging: pancreatitis was seen on 2020 CT SDOH: none.   Other: he may be entering partial remission.  Interval history: I reviewed continuous glucose monitor data.  Glucose varies from 120-400.  It is in general lowest at 3-5 AM, and highest at HS.  He takes 28 units QHS.   Pt also has MNG (dx'ed 2021; [place] advised annual f/u; TSH is normal on no rx).  He could not afford Januvia.    Past Medical History:  Diagnosis Date  . Hyperlipidemia   . Hypertension     Past Surgical History:  Procedure Laterality Date  . CYST EXCISION PERINEAL  1984    Social History   Socioeconomic History  . Marital status: Married    Spouse name: Not on file  . Number of children: Not on file  . Years of education: Not on file  . Highest education level: Not on file  Occupational History  . Not on file  Tobacco Use  . Smoking status: Former Smoker    Packs/day: 0.25    Years: 2.00    Pack years: 0.50    Quit date: 12/28/1973    Years since quitting: 46.8  . Smokeless tobacco: Never Used  Substance and Sexual Activity  . Alcohol use: Yes  . Drug use: No  . Sexual activity: Never  Other Topics Concern  . Not on file  Social History Narrative  . Not on file   Social Determinants of Health   Financial Resource Strain: Not on file  Food Insecurity: Not on file  Transportation Needs: Not on file  Physical Activity: Not on file  Stress: Not on file  Social Connections: Not on file  Intimate Partner Violence: Not on file    Current Outpatient Medications on File Prior to Visit  Medication Sig Dispense Refill  . clotrimazole (LOTRIMIN) 1 % cream Apply to both feet and between toes  twice daily for 6 weeks. 60 g 1  . Continuous Blood Gluc Sensor (FREESTYLE LIBRE 14 DAY SENSOR) MISC 1 each by Does not apply route every 14 (fourteen) days. For use with continuous glucose monitoring system; Change sensor every 14 days; E11.9 6 each 2  . gabapentin (NEURONTIN) 300 MG capsule TAKE 1 CAPSULE BY MOUTH TWICE A DAY 180 capsule 0  . Insulin Pen Needle (B-D ULTRAFINE III SHORT PEN) 31G X 8 MM MISC USE AS DIRECTED 100 each 1  . lisinopril-hydrochlorothiazide (ZESTORETIC) 20-25 MG tablet TAKE 1 TABLET BY MOUTH EVERY DAY 90 tablet 3  . simvastatin (ZOCOR) 40 MG tablet TAKE 1 TABLET BY MOUTH EVERY DAY 90 tablet 0  . sitaGLIPtin (JANUVIA) 50 MG tablet Take 1 tablet (50 mg total) by mouth daily. 30 tablet 5   No current facility-administered medications on file prior to visit.    No Known Allergies  Family History  Problem Relation Age of Onset  . Diabetes Mother   . Hypertension Sister   . Hypertension Brother     BP 130/80 (BP Location: Right Arm, Patient Position: Sitting, Cuff Size: Large)   Pulse 94   Ht 5\' 6"  (1.676 m)   Wt 266 lb 6.4 oz (120.8 kg)  SpO2 95%   BMI 43.00 kg/m    Review of Systems He denies hypoglycemia    Objective:   Physical Exam VITAL SIGNS:  See vs page GENERAL: no distress Pulses: dorsalis pedis intact bilat.   MSK: no deformity of the feet CV: 2+ bilat leg edema Skin:  no ulcer on the feet.  normal temp on the feet.  There is hyperpigmentation and shin thickening at the intertrigenous areas of the toes.  Neuro: sensation is intact to touch on the feet.   Ext: there is bilateral onychomycosis of the toenails.   Lab Results  Component Value Date   HGBA1C 8.8 (A) 10/26/2020       Assessment & Plan:  Type 1 DM, with stage 3 CRI: uncontrolled.     Patient Instructions  Please increase the insulin to 35 units each morning.   We'll plan to recheck the thyroid ultrasound later this year.   Please come back for a follow-up appointment  in 2 months.

## 2020-11-30 ENCOUNTER — Other Ambulatory Visit: Payer: Self-pay | Admitting: Emergency Medicine

## 2020-11-30 DIAGNOSIS — E1142 Type 2 diabetes mellitus with diabetic polyneuropathy: Secondary | ICD-10-CM

## 2020-12-25 ENCOUNTER — Telehealth: Payer: Self-pay | Admitting: Endocrinology

## 2020-12-25 ENCOUNTER — Ambulatory Visit (INDEPENDENT_AMBULATORY_CARE_PROVIDER_SITE_OTHER): Payer: Medicaid Other | Admitting: Endocrinology

## 2020-12-25 ENCOUNTER — Other Ambulatory Visit: Payer: Self-pay

## 2020-12-25 VITALS — BP 140/90 | HR 75 | Ht 66.0 in | Wt 267.6 lb

## 2020-12-25 DIAGNOSIS — E1021 Type 1 diabetes mellitus with diabetic nephropathy: Secondary | ICD-10-CM

## 2020-12-25 DIAGNOSIS — E1022 Type 1 diabetes mellitus with diabetic chronic kidney disease: Secondary | ICD-10-CM

## 2020-12-25 DIAGNOSIS — Z794 Long term (current) use of insulin: Secondary | ICD-10-CM

## 2020-12-25 DIAGNOSIS — E1165 Type 2 diabetes mellitus with hyperglycemia: Secondary | ICD-10-CM

## 2020-12-25 DIAGNOSIS — N1831 Chronic kidney disease, stage 3a: Secondary | ICD-10-CM

## 2020-12-25 LAB — POCT GLYCOSYLATED HEMOGLOBIN (HGB A1C): Hemoglobin A1C: 7.3 % — AB (ref 4.0–5.6)

## 2020-12-25 MED ORDER — LANTUS SOLOSTAR 100 UNIT/ML ~~LOC~~ SOPN
34.0000 [IU] | PEN_INJECTOR | SUBCUTANEOUS | 3 refills | Status: DC
Start: 2020-12-25 — End: 2021-04-29

## 2020-12-25 NOTE — Patient Instructions (Addendum)
Please reduce the insulin to 34 units each morning.   We'll plan to recheck the thyroid ultrasound later this year.   Please come back for a follow-up appointment in 2 months.

## 2020-12-25 NOTE — Telephone Encounter (Signed)
please contact patient: continuous glucose monitor suggests you should take a premixed insulin instead of what you take.  OK with you?

## 2020-12-25 NOTE — Progress Notes (Signed)
Subjective:    Patient ID: Alexander Cochran, male    DOB: 02-Sep-1955, 65 y.o.   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   SDOH: He could not afford Januvia. Other: none Interval history: I reviewed continuous glucose monitor.  glucose varies from 40-280.  It is in general highest 5AM-8AM.  He declines multiple daily injections.   Pt also has MNG (dx'ed 2021; [place] advised annual f/u; TSH is normal on no rx).    Past Medical History:  Diagnosis Date  . Hyperlipidemia   . Hypertension     Past Surgical History:  Procedure Laterality Date  . CYST EXCISION PERINEAL  1984    Social History   Socioeconomic History  . Marital status: Married    Spouse name: Not on file  . Number of children: Not on file  . Years of education: Not on file  . Highest education level: Not on file  Occupational History  . Not on file  Tobacco Use  . Smoking status: Former Smoker    Packs/day: 0.25    Years: 2.00    Pack years: 0.50    Quit date: 12/28/1973    Years since quitting: 47.0  . Smokeless tobacco: Never Used  Substance and Sexual Activity  . Alcohol use: Yes  . Drug use: No  . Sexual activity: Never  Other Topics Concern  . Not on file  Social History Narrative  . Not on file   Social Determinants of Health   Financial Resource Strain: Not on file  Food Insecurity: Not on file  Transportation Needs: Not on file  Physical Activity: Not on file  Stress: Not on file  Social Connections: Not on file  Intimate Partner Violence: Not on file    Current Outpatient Medications on File Prior to Visit  Medication Sig Dispense Refill  . clotrimazole (LOTRIMIN) 1 % cream Apply to both feet and between toes twice daily for 6 weeks. 60 g 1  .  Continuous Blood Gluc Sensor (FREESTYLE LIBRE 14 DAY SENSOR) MISC 1 each by Does not apply route every 14 (fourteen) days. For use with continuous glucose monitoring system; Change sensor every 14 days; E11.9 6 each 2  . gabapentin (NEURONTIN) 300 MG capsule TAKE 1 CAPSULE BY MOUTH TWICE A DAY 180 capsule 0  . Insulin Pen Needle (B-D ULTRAFINE III SHORT PEN) 31G X 8 MM MISC USE AS DIRECTED 100 each 1  . lisinopril-hydrochlorothiazide (ZESTORETIC) 20-25 MG tablet TAKE 1 TABLET BY MOUTH EVERY DAY 90 tablet 3  . simvastatin (ZOCOR) 40 MG tablet TAKE 1 TABLET BY MOUTH EVERY DAY 90 tablet 0  . sitaGLIPtin (JANUVIA) 50 MG tablet Take 1 tablet (50 mg total) by mouth daily. 30 tablet 5   No current facility-administered medications on file prior to visit.    No Known Allergies  Family History  Problem Relation Age of Onset  . Diabetes Mother   . Hypertension Sister   . Hypertension Brother     BP 140/90 (BP Location: Right Arm, Patient Position: Sitting, Cuff Size: Large)   Pulse 75   Ht 5\' 6"  (1.676 m)   Wt 267 lb 9.6 oz (121.4 kg)   SpO2 96%   BMI 43.19 kg/m    Review of Systems  Objective:   Physical Exam VITAL SIGNS:  See vs page GENERAL: no distress Pulses: dorsalis pedis intact bilat.   MSK: no deformity of the feet CV: 1+ bilat leg edema Skin:  no ulcer on the feet.  normal temp on the feet.  There is hyperpigmentation and shin thickening at the intertrigenous areas of the toes.  Neuro: sensation is intact to touch on the feet.   Ext: there is bilateral onychomycosis of the toenails.   Lab Results  Component Value Date   HGBA1C 7.3 (A) 12/25/2020       Assessment & Plan:  DM, due to pancreatitis Hypoglycemia, due to insulin: Pt declines to change to a faster-acting qam insulin.    Patient Instructions  Please reduce the insulin to 34 units each morning.   We'll plan to recheck the thyroid ultrasound later this year.   Please come back for a follow-up  appointment in 2 months.

## 2020-12-26 ENCOUNTER — Ambulatory Visit (INDEPENDENT_AMBULATORY_CARE_PROVIDER_SITE_OTHER): Payer: Self-pay | Admitting: Emergency Medicine

## 2020-12-26 ENCOUNTER — Encounter: Payer: Self-pay | Admitting: Emergency Medicine

## 2020-12-26 VITALS — BP 140/86 | HR 90 | Temp 98.6°F | Ht 66.0 in | Wt 266.6 lb

## 2020-12-26 DIAGNOSIS — Z1211 Encounter for screening for malignant neoplasm of colon: Secondary | ICD-10-CM

## 2020-12-26 DIAGNOSIS — I152 Hypertension secondary to endocrine disorders: Secondary | ICD-10-CM

## 2020-12-26 DIAGNOSIS — E1169 Type 2 diabetes mellitus with other specified complication: Secondary | ICD-10-CM

## 2020-12-26 DIAGNOSIS — E1159 Type 2 diabetes mellitus with other circulatory complications: Secondary | ICD-10-CM

## 2020-12-26 DIAGNOSIS — E785 Hyperlipidemia, unspecified: Secondary | ICD-10-CM

## 2020-12-26 MED ORDER — LISINOPRIL-HYDROCHLOROTHIAZIDE 20-12.5 MG PO TABS: 2.0000 | ORAL_TABLET | Freq: Every day | ORAL | 3 refills | Status: DC

## 2020-12-26 NOTE — Patient Instructions (Signed)

## 2020-12-26 NOTE — Assessment & Plan Note (Signed)
Diet and nutrition discussed.  Continue simvastatin 40 mg daily. 

## 2020-12-26 NOTE — Progress Notes (Signed)
Alexander Cochran 65 y.o.   Chief Complaint  Patient presents with  . Diabetes  . Hypertension    Follow up 3 months   Lab Results  Component Value Date   HGBA1C 7.3 (A) 12/25/2020    HISTORY OF PRESENT ILLNESS: This is a 65 y.o. male with history of hypertension and diabetes here for follow-up. #1 hypertension: Taking Zestoretic 20-25 mg daily.  Blood pressure readings at home elevated. #2 diabetes: Presently on insulin glargine 34 units in the morning.  Saw his endocrinologist Dr. Loanne Drilling yesterday.  Assessment and plan as follows: Assessment & Plan:  DM, due to pancreatitis Hypoglycemia, due to insulin: Pt declines to change to a faster-acting qam insulin.     Patient Instructions  Please reduce the insulin to 34 units each morning.   We'll plan to recheck the thyroid ultrasound later this year.   Please come back for a follow-up appointment in 2 months.     No other complaints or medical concerns today.   HPI   Prior to Admission medications   Medication Sig Start Date End Date Taking? Authorizing Provider  clotrimazole (LOTRIMIN) 1 % cream Apply to both feet and between toes twice daily for 6 weeks. 04/19/20  Yes [place], Stephani Police, DPM  gabapentin (NEURONTIN) 300 MG capsule TAKE 1 CAPSULE BY MOUTH TWICE A DAY 11/30/20  Yes Harwood Nall, Ines Bloomer, MD  insulin glargine (LANTUS SOLOSTAR) 100 UNIT/ML Solostar Pen Inject 34 Units into the skin every morning. And pen needles 1/day 12/25/20  Yes Renato Shin, MD  lisinopril-hydrochlorothiazide (ZESTORETIC) 20-25 MG tablet TAKE 1 TABLET BY MOUTH EVERY DAY 11/16/19  Yes Tzivia Oneil, Ines Bloomer, MD  simvastatin (ZOCOR) 40 MG tablet TAKE 1 TABLET BY MOUTH EVERY DAY 10/29/20  Yes Bodhi Stenglein, Ines Bloomer, MD  Continuous Blood Gluc Sensor (FREESTYLE LIBRE 14 DAY SENSOR) MISC 1 each by Does not apply route every 14 (fourteen) days. For use with continuous glucose monitoring system; Change sensor every 14 days; E11.9 01/19/20   Renato Shin, MD   Insulin Pen Needle (B-D ULTRAFINE III SHORT PEN) 31G X 8 MM MISC USE AS DIRECTED 09/25/20   [name], MD  sitaGLIPtin (JANUVIA) 50 MG tablet Take 1 tablet (50 mg total) by mouth daily. 09/25/20 10/25/20  [name], MD    No Known Allergies  Patient Active Problem List   Diagnosis Date Noted  . Type 1 diabetes ([place]) 03/13/2020  . Diabetic polyneuropathy associated with type 2 diabetes mellitus ([place]) 09/26/2019  . Type 2 diabetes mellitus with hyperglycemia ([place]) 09/26/2019  . Dyslipidemia associated with type 2 diabetes mellitus ([place]) 09/26/2019  . Stage 3a chronic kidney disease ([place]) 09/26/2019  . Hyperosmolar hyperglycemic coma due to diabetes mellitus without ketoacidosis ([place]) 08/30/2018  . Left renal mass 08/30/2018  . Mass of thyroid region 08/30/2018  . Type 2 MI (myocardial infarction) ([place]) 08/30/2018  . Acute on chronic kidney failure ([place]) 08/28/2018  . Morbid obesity ([place]) 08/28/2018  . Severe acute pancreatitis 08/28/2018  . BMI 38.0-38.9,adult 10/21/2011  . 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 Hyperlipemia 10/21/2011    Past Medical History:  Diagnosis Date  . Hyperlipidemia   . Hypertension     Past Surgical History:  Procedure Laterality Date  . CYST EXCISION PERINEAL  1984    Social History   Socioeconomic History  . Marital status: Married    Spouse name: Not on file  . Number of children: Not on file  . Years of education: Not  on file  . Highest education level: Not on file  Occupational History  . Not on file  Tobacco Use  . Smoking status: Former Smoker    Packs/day: 0.25    Years: 2.00    Pack years: 0.50    Quit date: 12/28/1973    Years since quitting: 47.0  . Smokeless tobacco: Never Used  Substance and Sexual Activity  . Alcohol use: Yes  . Drug use: No  . Sexual activity: Never  Other Topics Concern  . Not on file  Social History Narrative  . Not on file   Social Determinants of Health   Financial  Resource Strain: Not on file  Food Insecurity: Not on file  Transportation Needs: Not on file  Physical Activity: Not on file  Stress: Not on file  Social Connections: Not on file  Intimate Partner Violence: Not on file    Family History  Problem Relation Age of Onset  . Diabetes Mother   . Hypertension Sister   . Hypertension Brother      Review of Systems  Constitutional: Negative.  Negative for chills and fever.  HENT: Negative.  Negative for congestion and sore throat.   Respiratory: Negative.  Negative for shortness of breath.   Cardiovascular: Negative.  Negative for chest pain and palpitations.  Gastrointestinal: Negative for abdominal pain, diarrhea, nausea and vomiting.  Genitourinary: Negative.  Negative for dysuria and hematuria.  Skin: Negative.  Negative for rash.  Neurological: Negative.  Negative for dizziness and headaches.  All other systems reviewed and are negative.   Today's Vitals   12/26/20 1020  BP: 140/86  Pulse: 90  Temp: 98.6 F (37 C)  TempSrc: Oral  SpO2: 97%  Weight: 266 lb 9.6 oz (120.9 kg)  Height: 5\' 6"  (1.676 m)   Body mass index is 43.03 kg/m. Wt Readings from Last 3 Encounters:  12/26/20 266 lb 9.6 oz (120.9 kg)  12/25/20 267 lb 9.6 oz (121.4 kg)  10/26/20 266 lb 6.4 oz (120.8 kg)    Physical Exam Vitals reviewed.  Constitutional:      Appearance: Normal appearance.  HENT:     Head: Normocephalic.  Eyes:     Extraocular Movements: Extraocular movements intact.     Conjunctiva/sclera: Conjunctivae normal.     Pupils: Pupils are equal, round, and reactive to light.  Cardiovascular:     Rate and Rhythm: Normal rate and regular rhythm.     Pulses: Normal pulses.     Heart sounds: Normal heart sounds.  Pulmonary:     Effort: Pulmonary effort is normal.     Breath sounds: Normal breath sounds.  Musculoskeletal:        General: Normal range of motion.     Cervical back: Normal range of motion and neck supple.   Lymphadenopathy:     Cervical: No cervical adenopathy.  Skin:    General: Skin is warm and dry.     Capillary Refill: Capillary refill takes less than 2 seconds.  Neurological:     General: No focal deficit present.     Mental Status: He is alert and oriented to person, place, and time.  Psychiatric:        Mood and Affect: Mood normal.        Behavior: Behavior normal.      ASSESSMENT & PLAN: A total of 30 minutes was spent with the patient and counseling/coordination of care regarding diabetes and hypertension and cardiovascular risks associated with these conditions, uncontrolled hypertension, review  of all medications and changes made, need to continue monitoring blood pressure readings at home and keeping a log, education on nutrition, review of most recent office visit notes, review of most recent endocrinologist's office visit notes, preventative care and health maintenance items including need for ophthalmologic evaluation and need for colon cancer screening with colonoscopy, prognosis, documentation and need for follow-up in 3 months.  Hypertension associated with type 2 diabetes mellitus (HCC) Elevated blood pressure readings in the office at home. Will increase dose of Zestoretic to 40-25 mg daily. Diet and nutrition discussed. Hemoglobin A1c better than before at 7.3. Continue insulin regimen as directed by endocrinologist. Continue simvastatin 40 mg daily. Follow-up in 3 months.  Dyslipidemia associated with type 2 diabetes mellitus ([place]) Diet and nutrition discussed.  Continue simvastatin 40 mg daily.  A[place] was seen today for diabetes and hypertension.  Diagnoses and all orders for this visit:  Hypertension associated with type 2 diabetes mellitus ([place]) -     Ambulatory referral to Ophthalmology -     lisinopril-hydrochlorothiazide (ZESTORETIC) 20-12.5 MG tablet; Take 2 tablets by mouth daily.  Dyslipidemia associated with type 2 diabetes mellitus  ([place])  Morbid obesity ([place])  Colon cancer screening -     Ambulatory referral to Gastroenterology     Patient Instructions   Hypertension, Adult High blood pressure (hypertension) is when the force of blood pumping through the arteries is too strong. The arteries are the blood vessels that carry blood from the heart throughout the body. Hypertension forces the heart to work harder to pump blood and may cause arteries to become narrow or stiff. Untreated or uncontrolled hypertension can cause a heart attack, heart failure, a stroke, kidney disease, and other problems. A blood pressure reading consists of a higher number over a lower number. Ideally, your blood pressure should be below 120/80. The first ("top") number is called the systolic pressure. It is a measure of the pressure in your arteries as your heart beats. The second ("bottom") number is called the diastolic pressure. It is a measure of the pressure in your arteries as the heart relaxes. What are the causes? The exact cause of this condition is not known. There are some conditions that result in or are related to high blood pressure. What increases the risk? Some risk factors for high blood pressure are under your control. The following factors may make you more likely to develop this condition:  Smoking.  Having type 2 diabetes mellitus, high cholesterol, or both.  Not getting enough exercise or physical activity.  Being overweight.  Having too much fat, sugar, calories, or salt (sodium) in your diet.  Drinking too much alcohol. Some risk factors for high blood pressure may be difficult or impossible to change. Some of these factors include:  Having chronic kidney disease.  Having a family history of high blood pressure.  Age. Risk increases with age.  Race. You may be at higher risk if you are African American.  Gender. Men are at higher risk than women before age 58. After age 16, women are at higher risk than  men.  Having obstructive sleep apnea.  Stress. What are the signs or symptoms? High blood pressure may not cause symptoms. Very high blood pressure (hypertensive crisis) may cause:  Headache.  Anxiety.  Shortness of breath.  Nosebleed.  Nausea and vomiting.  Vision changes.  Severe chest pain.  Seizures. How is this diagnosed? This condition is diagnosed by measuring your blood pressure while you are seated,  with your arm resting on a flat surface, your legs uncrossed, and your feet flat on the floor. The cuff of the blood pressure monitor will be placed directly against the skin of your upper arm at the level of your heart. It should be measured at least twice using the same arm. Certain conditions can cause a difference in blood pressure between your right and left arms. Certain factors can cause blood pressure readings to be lower or higher than normal for a short period of time:  When your blood pressure is higher when you are in a health care provider's office than when you are at home, this is called white coat hypertension. Most people with this condition do not need medicines.  When your blood pressure is higher at home than when you are in a health care provider's office, this is called masked hypertension. Most people with this condition may need medicines to control blood pressure. If you have a high blood pressure reading during one visit or you have normal blood pressure with other risk factors, you may be asked to:  Return on a different day to have your blood pressure checked again.  Monitor your blood pressure at home for 1 week or longer. If you are diagnosed with hypertension, you may have other blood or imaging tests to help your health care provider understand your overall risk for other conditions. How is this treated? This condition is treated by making healthy lifestyle changes, such as eating healthy foods, exercising more, and reducing your alcohol  intake. Your health care provider may prescribe medicine if lifestyle changes are not enough to get your blood pressure under control, and if:  Your systolic blood pressure is above 130.  Your diastolic blood pressure is above 80. Your personal target blood pressure may vary depending on your medical conditions, your age, and other factors. Follow these instructions at home: Eating and drinking  Eat a diet that is high in fiber and potassium, and low in sodium, added sugar, and fat. An example eating plan is called the DASH (Dietary Approaches to Stop Hypertension) diet. To eat this way: ? Eat plenty of fresh fruits and vegetables. Try to fill one half of your plate at each meal with fruits and vegetables. ? Eat whole grains, such as whole-wheat pasta, brown rice, or whole-grain bread. Fill about one fourth of your plate with whole grains. ? Eat or drink low-fat dairy products, such as skim milk or low-fat yogurt. ? Avoid fatty cuts of meat, processed or cured meats, and poultry with skin. Fill about one fourth of your plate with lean proteins, such as fish, chicken without skin, beans, eggs, or tofu. ? Avoid pre-made and processed foods. These tend to be higher in sodium, added sugar, and fat.  Reduce your daily sodium intake. Most people with hypertension should eat less than 1,500 mg of sodium a day.  Do not drink alcohol if: ? Your health care provider tells you not to drink. ? You are pregnant, may be pregnant, or are planning to become pregnant.  If you drink alcohol: ? Limit how much you use to:  0-1 drink a day for women.  0-2 drinks a day for men. ? Be aware of how much alcohol is in your drink. In the [place] one drink equals one 12 oz bottle of beer (355 mL), one 5 oz glass of wine (148 mL), or one 1 oz glass of hard liquor (44 mL).   Lifestyle  Work with  your health care provider to maintain a healthy body weight or to lose weight. Ask what an ideal weight is for you.  Get  at least 30 minutes of exercise most days of the week. Activities may include walking, swimming, or biking.  Include exercise to strengthen your muscles (resistance exercise), such as Pilates or lifting weights, as part of your weekly exercise routine. Try to do these types of exercises for 30 minutes at least 3 days a week.  Do not use any products that contain nicotine or tobacco, such as cigarettes, e-cigarettes, and chewing tobacco. If you need help quitting, ask your health care provider.  Monitor your blood pressure at home as told by your health care provider.  Keep all follow-up visits as told by your health care provider. This is important.   Medicines  Take over-the-counter and prescription medicines only as told by your health care provider. Follow directions carefully. Blood pressure medicines must be taken as prescribed.  Do not skip doses of blood pressure medicine. Doing this puts you at risk for problems and can make the medicine less effective.  Ask your health care provider about side effects or reactions to medicines that you should watch for. Contact a health care provider if you:  Think you are having a reaction to a medicine you are taking.  Have headaches that keep coming back (recurring).  Feel dizzy.  Have swelling in your ankles.  Have trouble with your vision. Get help right away if you:  Develop a severe headache or confusion.  Have unusual weakness or numbness.  Feel faint.  Have severe pain in your chest or abdomen.  Vomit repeatedly.  Have trouble breathing. Summary  Hypertension is when the force of blood pumping through your arteries is too strong. If this condition is not controlled, it may put you at risk for serious complications.  Your personal target blood pressure may vary depending on your medical conditions, your age, and other factors. For most people, a normal blood pressure is less than 120/80.  Hypertension is treated with  lifestyle changes, medicines, or a combination of both. Lifestyle changes include losing weight, eating a healthy, low-sodium diet, exercising more, and limiting alcohol. This information is not intended to replace advice given to you by your health care provider. Make sure you discuss any questions you have with your health care provider. Document Revised: 03/24/2018 Document Reviewed: 03/24/2018 Elsevier Patient Education  2021 [place], MD [facility]

## 2020-12-26 NOTE — Assessment & Plan Note (Signed)
Elevated blood pressure readings in the office at home. Will increase dose of Zestoretic to 40-25 mg daily. Diet and nutrition discussed. Hemoglobin A1c better than before at 7.3. Continue insulin regimen as directed by endocrinologist. Continue simvastatin 40 mg daily. Follow-up in 3 months.

## 2020-12-26 NOTE — Telephone Encounter (Signed)
LVM for pt to contact the office on what he would like to do in regards to taking a premix insulin. Waiting on his response.

## 2021-01-18 ENCOUNTER — Other Ambulatory Visit: Payer: Self-pay | Admitting: Emergency Medicine

## 2021-01-18 DIAGNOSIS — E785 Hyperlipidemia, unspecified: Secondary | ICD-10-CM

## 2021-02-05 ENCOUNTER — Other Ambulatory Visit: Payer: Self-pay | Admitting: Endocrinology

## 2021-02-05 ENCOUNTER — Other Ambulatory Visit: Payer: Self-pay | Admitting: Emergency Medicine

## 2021-02-05 DIAGNOSIS — E119 Type 2 diabetes mellitus without complications: Secondary | ICD-10-CM

## 2021-02-05 DIAGNOSIS — E1165 Type 2 diabetes mellitus with hyperglycemia: Secondary | ICD-10-CM

## 2021-02-21 ENCOUNTER — Encounter: Payer: Self-pay | Admitting: Podiatry

## 2021-02-21 ENCOUNTER — Ambulatory Visit (INDEPENDENT_AMBULATORY_CARE_PROVIDER_SITE_OTHER): Payer: Medicare Other | Admitting: Podiatry

## 2021-02-21 ENCOUNTER — Other Ambulatory Visit: Payer: Self-pay

## 2021-02-21 DIAGNOSIS — E1142 Type 2 diabetes mellitus with diabetic polyneuropathy: Secondary | ICD-10-CM | POA: Diagnosis not present

## 2021-02-21 DIAGNOSIS — M79674 Pain in right toe(s): Secondary | ICD-10-CM | POA: Diagnosis not present

## 2021-02-21 DIAGNOSIS — M79675 Pain in left toe(s): Secondary | ICD-10-CM | POA: Diagnosis not present

## 2021-02-21 DIAGNOSIS — B351 Tinea unguium: Secondary | ICD-10-CM

## 2021-02-24 NOTE — Progress Notes (Signed)
  Subjective:  Patient ID: Alexander Cochran, male    DOB: [birth year],  MRN: [id number]  Alexander Cochran presents to clinic today for preventative diabetic foot care and painful thick toenails that are difficult to trim. Pain interferes with ambulation. Aggravating factors include wearing enclosed shoe gear. Pain is relieved with periodic professional debridement.  Patient did not check blood glucose today.  PCP is [name], MD , and last visit was 12/26/2020.  No Known Allergies  Review of Systems: Negative except as noted in the HPI. Objective:   Constitutional Alexander Cochran is a pleasant 65 y.o. African American male, in NAD. AAO x 3.   Vascular Capillary fill time to digits delayed b/l lower extremities. Faintly palpable pedal pulses b/l. Pedal hair absent. Lower extremity skin temperature gradient within normal limits. No pain with calf compression b/l. No cyanosis or clubbing noted.  Neurologic Normal speech. Oriented to person, place, and time. Protective sensation diminished with 10g monofilament b/l. Vibratory sensation diminished b/l.  Dermatologic Pedal skin with normal turgor, texture and tone b/l lower extremities. No open wounds b/l lower extremities. No interdigital macerations b/l lower extremities. Toenails 1-5 b/l elongated, discolored, dystrophic, thickened, crumbly with subungual debris and tenderness to dorsal palpation.  Orthopedic: Normal muscle strength 5/5 to all lower extremity muscle groups bilaterally. 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   Radiographs: None Assessment:   1. Pain due to onychomycosis of toenails of both feet   2. Diabetic peripheral neuropathy associated with type 2 diabetes mellitus ([place])    Plan:  -Examined patient. -Continue diabetic foot care principles. -Patient to continue soft, supportive shoe gear daily. -Toenails 1-5 b/l were debrided in length and girth with sterile nail nippers and dremel without iatrogenic  bleeding.  -Patient to report any pedal injuries to medical professional immediately. -Patient/POA to call should there be question/concern in the interim.  Return in about 3 months (around 05/24/2021).  [name], DPM

## 2021-03-01 ENCOUNTER — Other Ambulatory Visit: Payer: Self-pay | Admitting: Emergency Medicine

## 2021-03-01 DIAGNOSIS — E785 Hyperlipidemia, unspecified: Secondary | ICD-10-CM

## 2021-03-01 DIAGNOSIS — E1142 Type 2 diabetes mellitus with diabetic polyneuropathy: Secondary | ICD-10-CM

## 2021-03-05 ENCOUNTER — Telehealth: Payer: Self-pay | Admitting: Emergency Medicine

## 2021-03-05 NOTE — Telephone Encounter (Signed)
   Patient would like a copy of his medication sent to his email address on file Patient does not have MyChart set up. Patient encouraged to register with MyChart   [place]  would like to have med list

## 2021-03-08 NOTE — Telephone Encounter (Signed)
Called pt to verify e-mail address, no answer. Left a vm to call back.

## 2021-03-08 NOTE — Telephone Encounter (Signed)
Pt called back to verify e mail address. Sent medication list.

## 2021-03-26 ENCOUNTER — Ambulatory Visit: Payer: Medicaid Other | Admitting: Endocrinology

## 2021-03-28 ENCOUNTER — Ambulatory Visit: Payer: Medicaid Other | Admitting: Emergency Medicine

## 2021-04-05 ENCOUNTER — Other Ambulatory Visit: Payer: Self-pay | Admitting: Emergency Medicine

## 2021-04-05 DIAGNOSIS — I10 Essential (primary) hypertension: Secondary | ICD-10-CM

## 2021-04-08 ENCOUNTER — Encounter: Payer: Self-pay | Admitting: Emergency Medicine

## 2021-04-08 ENCOUNTER — Other Ambulatory Visit: Payer: Self-pay

## 2021-04-08 ENCOUNTER — Ambulatory Visit (INDEPENDENT_AMBULATORY_CARE_PROVIDER_SITE_OTHER): Payer: Medicare Other | Admitting: Emergency Medicine

## 2021-04-08 VITALS — BP 138/80 | HR 64 | Temp 98.3°F | Ht 66.0 in | Wt 260.0 lb

## 2021-04-08 DIAGNOSIS — I152 Hypertension secondary to endocrine disorders: Secondary | ICD-10-CM

## 2021-04-08 DIAGNOSIS — E1169 Type 2 diabetes mellitus with other specified complication: Secondary | ICD-10-CM

## 2021-04-08 DIAGNOSIS — N1831 Chronic kidney disease, stage 3a: Secondary | ICD-10-CM

## 2021-04-08 DIAGNOSIS — Z1211 Encounter for screening for malignant neoplasm of colon: Secondary | ICD-10-CM

## 2021-04-08 DIAGNOSIS — E1165 Type 2 diabetes mellitus with hyperglycemia: Secondary | ICD-10-CM

## 2021-04-08 DIAGNOSIS — E785 Hyperlipidemia, unspecified: Secondary | ICD-10-CM | POA: Diagnosis not present

## 2021-04-08 DIAGNOSIS — E1159 Type 2 diabetes mellitus with other circulatory complications: Secondary | ICD-10-CM | POA: Diagnosis not present

## 2021-04-08 DIAGNOSIS — E1142 Type 2 diabetes mellitus with diabetic polyneuropathy: Secondary | ICD-10-CM | POA: Diagnosis not present

## 2021-04-08 LAB — COMPREHENSIVE METABOLIC PANEL
ALT: 23 U/L (ref 0–53)
AST: 24 U/L (ref 0–37)
Albumin: 4.4 g/dL (ref 3.5–5.2)
Alkaline Phosphatase: 55 U/L (ref 39–117)
BUN: 14 mg/dL (ref 6–23)
CO2: 30 mEq/L (ref 19–32)
Calcium: 9.8 mg/dL (ref 8.4–10.5)
Chloride: 105 mEq/L (ref 96–112)
Creatinine, Ser: 1.69 mg/dL — ABNORMAL HIGH (ref 0.40–1.50)
GFR: 42.45 mL/min — ABNORMAL LOW (ref >60.00)
Glucose, Bld: 89 mg/dL (ref 70–99)
Potassium: 3.6 mEq/L (ref 3.5–5.1)
Sodium: 144 mEq/L (ref 135–145)
Total Bilirubin: 0.8 mg/dL (ref 0.2–1.2)
Total Protein: 7.3 g/dL (ref 6.0–8.3)

## 2021-04-08 LAB — POCT GLYCOSYLATED HEMOGLOBIN (HGB A1C): Hemoglobin A1C: 8.2 % — AB (ref 4.0–5.6)

## 2021-04-08 LAB — LIPID PANEL
Cholesterol: 125 mg/dL (ref 0–200)
HDL: 33 mg/dL — ABNORMAL LOW (ref >39.00)
NonHDL: 92.44
Total CHOL/HDL Ratio: 4
Triglycerides: 215 mg/dL — ABNORMAL HIGH (ref 0.0–149.0)
VLDL: 43 mg/dL — ABNORMAL HIGH (ref 0.0–40.0)

## 2021-04-08 LAB — LDL CHOLESTEROL, DIRECT: Direct LDL: 65 mg/dL

## 2021-04-08 MED ORDER — EMPAGLIFLOZIN 10 MG PO TABS: 10.0000 mg | ORAL_TABLET | Freq: Every day | ORAL | 3 refills | Status: DC

## 2021-04-08 NOTE — Assessment & Plan Note (Signed)
Well-controlled hypertension with normal blood pressure readings at home.  Continue Zestoretic 40--25 mg daily.  Dietary approach to stop hypertension discussed. Uncontrolled diabetes with hemoglobin A1c higher than before at 8.2. Continue insulin glargine 34 units in the morning and start Jardiance 10 mg daily. Patient has history of chronic kidney disease stage IIIa. Diet and nutrition discussed. Has endocrinology appointment next month with Dr. Loanne Drilling. Follow-up with me in 3 months.

## 2021-04-08 NOTE — Assessment & Plan Note (Signed)
Diet and nutrition discussed.  Continue simvastatin 40 mg daily. 

## 2021-04-08 NOTE — Patient Instructions (Signed)
Diabetes Mellitus and Nutrition, Adult When you have diabetes, or diabetes mellitus, it is very important to have healthy eating habits because your blood sugar (glucose) levels are greatly affected by what you eat and drink. Eating healthy foods in the right amounts, at about the same times every day, can help you:  Control your blood glucose.  Lower your risk of heart disease.  Improve your blood pressure.  Reach or maintain a healthy weight. What can affect my meal plan? Every person with diabetes is different, and each person has different needs for a meal plan. Your health care provider may recommend that you work with a dietitian to make a meal plan that is best for you. Your meal plan may vary depending on factors such as:  The calories you need.  The medicines you take.  Your weight.  Your blood glucose, blood pressure, and cholesterol levels.  Your activity level.  Other health conditions you have, such as heart or kidney disease. How do carbohydrates affect me? Carbohydrates, also called carbs, affect your blood glucose level more than any other type of food. Eating carbs naturally raises the amount of glucose in your blood. Carb counting is a method for keeping track of how many carbs you eat. Counting carbs is important to keep your blood glucose at a healthy level, especially if you use insulin or take certain oral diabetes medicines. It is important to know how many carbs you can safely have in each meal. This is different for every person. Your dietitian can help you calculate how many carbs you should have at each meal and for each snack. How does alcohol affect me? Alcohol can cause a sudden decrease in blood glucose (hypoglycemia), especially if you use insulin or take certain oral diabetes medicines. Hypoglycemia can be a life-threatening condition. Symptoms of hypoglycemia, such as sleepiness, dizziness, and confusion, are similar to symptoms of having too much  alcohol.  Do not drink alcohol if: ? Your health care provider tells you not to drink. ? You are pregnant, may be pregnant, or are planning to become pregnant.  If you drink alcohol: ? Do not drink on an empty stomach. ? Limit how much you use to:  0-1 drink a day for women.  0-2 drinks a day for men. ? Be aware of how much alcohol is in your drink. In the [place] one drink equals one 12 oz bottle of beer (355 mL), one 5 oz glass of wine (148 mL), or one 1 oz glass of hard liquor (44 mL). ? Keep yourself hydrated with water, diet soda, or unsweetened iced tea.  Keep in mind that regular soda, juice, and other mixers may contain a lot of sugar and must be counted as carbs. What are tips for following this plan? Reading food labels  Start by checking the serving size on the "Nutrition Facts" label of packaged foods and drinks. The amount of calories, carbs, fats, and other nutrients listed on the label is based on one serving of the item. Many items contain more than one serving per package.  Check the total grams (g) of carbs in one serving. You can calculate the number of servings of carbs in one serving by dividing the total carbs by 15. For example, if a food has 30 g of total carbs per serving, it would be equal to 2 servings of carbs.  Check the number of grams (g) of saturated fats and trans fats in one serving. Choose foods that have   a low amount or none of these fats.  Check the number of milligrams (mg) of salt (sodium) in one serving. Most people should limit total sodium intake to less than 2,300 mg per day.  Always check the nutrition information of foods labeled as "low-fat" or "nonfat." These foods may be higher in added sugar or refined carbs and should be avoided.  Talk to your dietitian to identify your daily goals for nutrients listed on the label. Shopping  Avoid buying canned, pre-made, or processed foods. These foods tend to be high in fat, sodium, and added  sugar.  Shop around the outside edge of the grocery store. This is where you will most often find fresh fruits and vegetables, bulk grains, fresh meats, and fresh dairy. Cooking  Use low-heat cooking methods, such as baking, instead of high-heat cooking methods like deep frying.  Cook using healthy oils, such as olive, canola, or sunflower oil.  Avoid cooking with butter, cream, or high-fat meats. Meal planning  Eat meals and snacks regularly, preferably at the same times every day. Avoid going long periods of time without eating.  Eat foods that are high in fiber, such as fresh fruits, vegetables, beans, and whole grains. Talk with your dietitian about how many servings of carbs you can eat at each meal.  Eat 4-6 oz (112-168 g) of lean protein each day, such as lean meat, chicken, fish, eggs, or tofu. One ounce (oz) of lean protein is equal to: ? 1 oz (28 g) of meat, chicken, or fish. ? 1 egg. ?  cup (62 g) of tofu.  Eat some foods each day that contain healthy fats, such as avocado, nuts, seeds, and fish.   What foods should I eat? Fruits Berries. Apples. Oranges. Peaches. Apricots. Plums. Grapes. Mango. Papaya. Pomegranate. Kiwi. Cherries. Vegetables Lettuce. Spinach. Leafy greens, including kale, chard, collard greens, and mustard greens. Beets. Cauliflower. Cabbage. Broccoli. Carrots. Green beans. Tomatoes. Peppers. Onions. Cucumbers. Brussels sprouts. Grains Whole grains, such as whole-wheat or whole-grain bread, crackers, tortillas, cereal, and pasta. Unsweetened oatmeal. Quinoa. Brown or wild rice. Meats and other proteins Seafood. Poultry without skin. Lean cuts of poultry and beef. Tofu. Nuts. Seeds. Dairy Low-fat or fat-free dairy products such as milk, yogurt, and cheese. The items listed above may not be a complete list of foods and beverages you can eat. Contact a dietitian for more information. What foods should I avoid? Fruits Fruits canned with  syrup. Vegetables Canned vegetables. Frozen vegetables with butter or cream sauce. Grains Refined white flour and flour products such as bread, pasta, snack foods, and cereals. Avoid all processed foods. Meats and other proteins Fatty cuts of meat. Poultry with skin. Breaded or fried meats. Processed meat. Avoid saturated fats. Dairy Full-fat yogurt, cheese, or milk. Beverages Sweetened drinks, such as soda or iced tea. The items listed above may not be a complete list of foods and beverages you should avoid. Contact a dietitian for more information. Questions to ask a health care provider  Do I need to meet with a diabetes educator?  Do I need to meet with a dietitian?  What number can I call if I have questions?  When are the best times to check my blood glucose? Where to find more information:  American Diabetes Association: diabetes.org  Academy of Nutrition and Dietetics: www.eatright.org  National Institute of Diabetes and Digestive and Kidney Diseases: www.niddk.nih.gov  Association of Diabetes Care and Education Specialists: www.diabeteseducator.org Summary  It is important to have healthy eating   habits because your blood sugar (glucose) levels are greatly affected by what you eat and drink.  A healthy meal plan will help you control your blood glucose and maintain a healthy lifestyle.  Your health care provider may recommend that you work with a dietitian to make a meal plan that is best for you.  Keep in mind that carbohydrates (carbs) and alcohol have immediate effects on your blood glucose levels. It is important to count carbs and to use alcohol carefully. This information is not intended to replace advice given to you by your health care provider. Make sure you discuss any questions you have with your health care provider. Document Revised: 06/21/2019 Document Reviewed: 06/21/2019 Elsevier Patient Education  2021 Elsevier Inc.  

## 2021-04-08 NOTE — [place] Notes (Signed)
Alexander Cochran 65 y.o.   Chief Complaint  Patient presents with   Hypertension    Follow up BP and diabetes    HISTORY OF PRESENT ILLNESS: This is a 65 y.o. male with a history of diabetes and hypertension here for follow-up. #1 diabetes: Takes insulin glargine 34 units in the morning #2 hypertension: Takes Zestoretic 40-25 mg daily. Normal blood pressure readings at home No complaints or medical concerns today. Lab Results  Component Value Date   HGBA1C 7.3 (A) 12/25/2020       Prior to Admission medications   Medication Sig Start Date End Date Taking? Authorizing Provider  clotrimazole (LOTRIMIN) 1 % cream Apply to both feet and between toes twice daily for 6 weeks. 04/19/20  Yes [place], Stephani Police, DPM  Continuous Blood Gluc Sensor (FREESTYLE LIBRE 14 DAY SENSOR) MISC APPLLY 1 SENSOR EVERY 14 DAYS FOR USE WITH CONTINUOUS GLUCOSE MONITORING 02/05/21  Yes Renato Shin, MD  gabapentin (NEURONTIN) 300 MG capsule TAKE 1 CAPSULE BY MOUTH TWICE A DAY 03/01/21  Yes Dasiah Hooley, Ines Bloomer, MD  insulin glargine (LANTUS SOLOSTAR) 100 UNIT/ML Solostar Pen Inject 34 Units into the skin every morning. And pen needles 1/day 12/25/20  Yes Renato Shin, MD  Insulin Pen Needle (B-D ULTRAFINE III SHORT PEN) 31G X 8 MM MISC USE AS DIRECTED 09/25/20  Yes Richardson Dubree, Ines Bloomer, MD  lisinopril-hydrochlorothiazide (ZESTORETIC) 20-12.5 MG tablet Take 2 tablets by mouth daily. 12/26/20 04/08/21 Yes Jahnyla Parrillo, Ines Bloomer, MD  simvastatin (ZOCOR) 40 MG tablet TAKE 1 TABLET BY MOUTH EVERY DAY 03/01/21  Yes Dannon Nguyenthi, Ines Bloomer, MD  sitaGLIPtin (JANUVIA) 50 MG tablet Take 1 tablet (50 mg total) by mouth daily. 09/25/20 10/25/20  [name], MD    No Known Allergies  Patient Active Problem List   Diagnosis Date Noted   Hypertension associated with type 2 diabetes mellitus ([place]) 12/26/2020   Type 1 diabetes ([place]) 03/13/2020   Diabetic polyneuropathy associated with type 2 diabetes mellitus (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   Hyperlipemia 10/21/2011    Past Medical History:  Diagnosis Date   Hyperlipidemia    Hypertension     Past Surgical History:  Procedure Laterality Date   CYST EXCISION PERINEAL  1984    Social History   Socioeconomic History   Marital status: Married    Spouse name: Not on file   Number of children: Not on file   Years of education: Not on file   Highest education level: Not on file  Occupational History   Not on file  Tobacco Use   Smoking status: Former    Packs/day: 0.25    Years: 2.00    Pack years: 0.50    Types: Cigarettes    Quit date: 12/28/1973    Years since quitting: 47.3   Smokeless tobacco: Never  Substance and Sexual Activity   Alcohol use: Yes   Drug use: No   Sexual activity: Never  Other Topics Concern   Not on file  Social History Narrative   Not on file   Social Determinants of Radio broadcast assistant  Strain: Not on file  Food Insecurity: Not on file  Transportation Needs: Not on file  Physical Activity: Not on file  Stress: Not on file  Social Connections: Not on file  Intimate Partner Violence: Not on file    Family History  Problem Relation Age of Onset   Diabetes Mother    Hypertension Sister    Hypertension Brother      Review of Systems  Constitutional: Negative.  Negative for chills and fever.  HENT: Negative.  Negative for congestion and sore throat.   Respiratory: Negative.  Negative for cough and shortness of breath.   Cardiovascular:  Negative for chest pain and palpitations.   Gastrointestinal: Negative.  Negative for abdominal pain, diarrhea, nausea and vomiting.  Genitourinary: Negative.  Negative for dysuria.  Skin: Negative.  Negative for rash.  Neurological:  Negative for dizziness and headaches.  All other systems reviewed and are negative.  Today's Vitals   04/08/21 1112  BP: 140/82  Pulse: 64  Temp: 98.3 F (36.8 C)  TempSrc: Oral  SpO2: 94%  Weight: 260 lb (117.9 kg)  Height: '5\' 6"'$  (1.676 m)   Body mass index is 41.97 kg/m. Wt Readings from Last 3 Encounters:  04/08/21 260 lb (117.9 kg)  12/26/20 266 lb 9.6 oz (120.9 kg)  12/25/20 267 lb 9.6 oz (121.4 kg)   BP Readings from Last 3 Encounters:  04/08/21 140/82  12/26/20 140/86  12/25/20 140/90    Physical Exam Vitals reviewed.  Constitutional:      Appearance: Normal appearance.  HENT:     Head: Normocephalic.  Eyes:     Extraocular Movements: Extraocular movements intact.     Conjunctiva/sclera: Conjunctivae normal.     Pupils: Pupils are equal, round, and reactive to light.  Cardiovascular:     Rate and Rhythm: Normal rate and regular rhythm.     Pulses: Normal pulses.     Heart sounds: Normal heart sounds.  Musculoskeletal:        General: Normal range of motion.     Cervical back: Normal range of motion and neck supple.  Skin:    General: Skin is warm and dry.     Capillary Refill: Capillary refill takes less than 2 seconds.  Neurological:     General: No focal deficit present.     Mental Status: He is alert and oriented to person, place, and time.  Psychiatric:        Mood and Affect: Mood normal.        Behavior: Behavior normal.    Results for orders placed or performed in visit on 04/08/21 (from the past 24 hour(s))  POCT glycosylated hemoglobin (Hb A1C)     Status: Abnormal   Collection Time: 04/08/21 11:28 AM  Result Value Ref Range   Hemoglobin A1C 8.2 (A) 4.0 - 5.6 %   HbA1c POC (<> result, manual entry)     HbA1c, POC (prediabetic range)     HbA1c, POC  (controlled diabetic range)      ASSESSMENT & PLAN: Hypertension associated with type 2 diabetes mellitus ([place]) Well-controlled hypertension with normal blood pressure readings at home.  Continue Zestoretic 40--25 mg daily.  Dietary approach to stop hypertension discussed. Uncontrolled diabetes with hemoglobin A1c higher than before at 8.2. Continue insulin glargine 34 units in the morning and start Jardiance 10 mg daily. Patient has history of chronic kidney disease stage IIIa. Diet and nutrition discussed. Has endocrinology appointment next month with Dr. Loanne Drilling. Follow-up with me in  3 months.  Dyslipidemia associated with type 2 diabetes mellitus ([place]) Diet and nutrition discussed.  Continue simvastatin 40 mg daily.  Jomel was seen today for hypertension.  Diagnoses and all orders for this visit:  Hypertension associated with type 2 diabetes mellitus ([place]) -     POCT glycosylated hemoglobin (Hb A1C) -     Comprehensive metabolic panel  Dyslipidemia associated with type 2 diabetes mellitus ([facility]) -     Lipid panel  Stage 3a chronic kidney disease ([facility])  Diabetic polyneuropathy associated with type 2 diabetes mellitus ([place])  Uncontrolled type 2 diabetes mellitus with hyperglycemia ([facility]) -     empagliflozin (JARDIANCE) 10 MG TABS tablet; Take 1 tablet (10 mg total) by mouth daily before breakfast.  Colon cancer screening -     Cologuard   Patient Instructions  Diabetes Mellitus and Nutrition, Adult When you have diabetes, or diabetes mellitus, it is very important to have healthy eating habits because your blood sugar (glucose) levels are greatly affected by what you eat and drink. Eating healthy foods in the right amounts, at about the same times every day, can help you: Control your blood glucose. Lower your risk of heart disease. Improve your blood pressure. Reach or maintain a healthy weight. What can affect my meal plan? Every person with diabetes is different,  and each person has different needs for a meal plan. Your health care provider may recommend that you work with a dietitian to make a meal plan that is best for you. Your meal plan may vary depending on factors such as: The calories you need. The medicines you take. Your weight. Your blood glucose, blood pressure, and cholesterol levels. Your activity level. Other health conditions you have, such as heart or kidney disease. How do carbohydrates affect me? Carbohydrates, also called carbs, affect your blood glucose level more than any other type of food. Eating carbs naturally raises the amount of glucose in your blood. Carb counting is a method for keeping track of how many carbs you eat. Counting carbs is important to keep your blood glucose at a healthy level, especially if you use insulin or take certain oral diabetes medicines. It is important to know how many carbs you can safely have in each meal. This is different for every person. Your dietitian can help you calculate how many carbs you should have at each meal and for each snack. How does alcohol affect me? Alcohol can cause a sudden decrease in blood glucose (hypoglycemia), especially if you use insulin or take certain oral diabetes medicines. Hypoglycemia can be a life-threatening condition. Symptoms of hypoglycemia, such as sleepiness, dizziness, and confusion, are similar to symptoms of having too much alcohol. Do not drink alcohol if: Your health care provider tells you not to drink. You are pregnant, may be pregnant, or are planning to become pregnant. If you drink alcohol: Do not drink on an empty stomach. Limit how much you use to: 0-1 drink a day for women. 0-2 drinks a day for men. Be aware of how much alcohol is in your drink. In the [place] one drink equals one 12 oz bottle of beer (355 mL), one 5 oz glass of wine (148 mL), or one 1 oz glass of hard liquor (44 mL). Keep yourself hydrated with water, diet soda, or unsweetened  iced tea. Keep in mind that regular soda, juice, and other mixers may contain a lot of sugar and must be counted as carbs. What are tips for following this plan?  Reading food labels Start by checking the serving size on the "Nutrition Facts" label of packaged foods and drinks. The amount of calories, carbs, fats, and other nutrients listed on the label is based on one serving of the item. Many items contain more than one serving per package. Check the total grams (g) of carbs in one serving. You can calculate the number of servings of carbs in one serving by dividing the total carbs by 15. For example, if a food has 30 g of total carbs per serving, it would be equal to 2 servings of carbs. Check the number of grams (g) of saturated fats and trans fats in one serving. Choose foods that have a low amount or none of these fats. Check the number of milligrams (mg) of salt (sodium) in one serving. Most people should limit total sodium intake to less than 2,300 mg per day. Always check the nutrition information of foods labeled as "low-fat" or "nonfat." These foods may be higher in added sugar or refined carbs and should be avoided. Talk to your dietitian to identify your daily goals for nutrients listed on the label. Shopping Avoid buying canned, pre-made, or processed foods. These foods tend to be high in fat, sodium, and added sugar. Shop around the outside edge of the grocery store. This is where you will most often find fresh fruits and vegetables, bulk grains, fresh meats, and fresh dairy. Cooking Use low-heat cooking methods, such as baking, instead of high-heat cooking methods like deep frying. Cook using healthy oils, such as olive, canola, or sunflower oil. Avoid cooking with butter, cream, or high-fat meats. Meal planning Eat meals and snacks regularly, preferably at the same times every day. Avoid going long periods of time without eating. Eat foods that are high in fiber, such as fresh  fruits, vegetables, beans, and whole grains. Talk with your dietitian about how many servings of carbs you can eat at each meal. Eat 4-6 oz (112-168 g) of lean protein each day, such as lean meat, chicken, fish, eggs, or tofu. One ounce (oz) of lean protein is equal to: 1 oz (28 g) of meat, chicken, or fish. 1 egg.  cup (62 g) of tofu. Eat some foods each day that contain healthy fats, such as avocado, nuts, seeds, and fish. What foods should I eat? Fruits Berries. Apples. Oranges. Peaches. Apricots. Plums. Grapes. Mango. Papaya. Pomegranate. Kiwi. Cherries. Vegetables Lettuce. Spinach. Leafy greens, including kale, chard, collard greens, and mustard greens. Beets. Cauliflower. Cabbage. Broccoli. Carrots. Green beans. Tomatoes. Peppers. Onions. Cucumbers. Brussels sprouts. Grains Whole grains, such as whole-wheat or whole-grain bread, crackers, tortillas, cereal, and pasta. Unsweetened oatmeal. Quinoa. Brown or wild rice. Meats and other proteins Seafood. Poultry without skin. Lean cuts of poultry and beef. Tofu. Nuts. Seeds. Dairy Low-fat or fat-free dairy products such as milk, yogurt, and cheese. The items listed above may not be a complete list of foods and beverages you can eat. Contact a dietitian for more information. What foods should I avoid? Fruits Fruits canned with syrup. Vegetables Canned vegetables. Frozen vegetables with butter or cream sauce. Grains Refined white flour and flour products such as bread, pasta, snack foods, and cereals. Avoid all processed foods. Meats and other proteins Fatty cuts of meat. Poultry with skin. Breaded or fried meats. Processed meat. Avoid saturated fats. Dairy Full-fat yogurt, cheese, or milk. Beverages Sweetened drinks, such as soda or iced tea. The items listed above may not be a complete list of foods and beverages you  should avoid. Contact a dietitian for more information. Questions to ask a health care provider Do I need to meet  with a diabetes educator? Do I need to meet with a dietitian? What number can I call if I have questions? When are the best times to check my blood glucose? Where to find more information: American Diabetes Association: diabetes.org Academy of Nutrition and Dietetics: www.eatright.[employer] of Diabetes and Digestive and Kidney Diseases: [employer] Association of Diabetes Care and Education Specialists: www.diabeteseducator.org Summary It is important to have healthy eating habits because your blood sugar (glucose) levels are greatly affected by what you eat and drink. A healthy meal plan will help you control your blood glucose and maintain a healthy lifestyle. Your health care provider may recommend that you work with a dietitian to make a meal plan that is best for you. Keep in mind that carbohydrates (carbs) and alcohol have immediate effects on your blood glucose levels. It is important to count carbs and to use alcohol carefully. This information is not intended to replace advice given to you by your health care provider. Make sure you discuss any questions you have with your health care provider. Document Revised: 06/21/2019 Document Reviewed: 06/21/2019 Elsevier Patient Education  2021 [place], MD [facility]

## 2021-04-24 ENCOUNTER — Other Ambulatory Visit: Payer: Self-pay | Admitting: Emergency Medicine

## 2021-04-24 DIAGNOSIS — E1165 Type 2 diabetes mellitus with hyperglycemia: Secondary | ICD-10-CM

## 2021-04-24 DIAGNOSIS — Z794 Long term (current) use of insulin: Secondary | ICD-10-CM

## 2021-04-29 ENCOUNTER — Encounter: Payer: Self-pay | Admitting: Endocrinology

## 2021-04-29 ENCOUNTER — Other Ambulatory Visit: Payer: Self-pay

## 2021-04-29 ENCOUNTER — Ambulatory Visit (INDEPENDENT_AMBULATORY_CARE_PROVIDER_SITE_OTHER): Payer: Medicare Other | Admitting: Endocrinology

## 2021-04-29 VITALS — BP 142/88 | HR 85 | Ht 66.0 in | Wt 260.8 lb

## 2021-04-29 DIAGNOSIS — E1165 Type 2 diabetes mellitus with hyperglycemia: Secondary | ICD-10-CM

## 2021-04-29 DIAGNOSIS — R221 Localized swelling, mass and lump, neck: Secondary | ICD-10-CM

## 2021-04-29 DIAGNOSIS — Z794 Long term (current) use of insulin: Secondary | ICD-10-CM

## 2021-04-29 MED ORDER — LANTUS SOLOSTAR 100 UNIT/ML ~~LOC~~ SOPN: 36.0000 [IU] | PEN_INJECTOR | SUBCUTANEOUS | 3 refills | Status: DC

## 2021-04-29 NOTE — Progress Notes (Signed)
Subjective:    Patient ID: Alexander Cochran, male    DOB: [birth year], 65 y.o.   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   SDOH: He could not afford Jardiance.   Other: He declines multiple daily injections.  Interval history: pt does not have continuous glucose monitor device with him today, but he says cbg varies from 105-150.  He was rx'ed Jardiance, but did not pick up rx, due to cost.   Pt also has MNG (dx'ed 2021; [place] advised annual f/u; TSH is normal on no rx).   Past Medical History:  Diagnosis Date   Hyperlipidemia    Hypertension     Past Surgical History:  Procedure Laterality Date   CYST EXCISION PERINEAL  1984    Social History   Socioeconomic History   Marital status: Married    Spouse name: Not on file   Number of children: Not on file   Years of education: Not on file   Highest education level: Not on file  Occupational History   Not on file  Tobacco Use   Smoking status: Former    Packs/day: 0.25    Years: 2.00    Pack years: 0.50    Types: Cigarettes    Quit date: 12/28/1973    Years since quitting: 47.3   Smokeless tobacco: Never  Substance and Sexual Activity   Alcohol use: Yes   Drug use: No   Sexual activity: Never  Other Topics Concern   Not on file  Social History Narrative   Not on file   Social Determinants of Health   Financial Resource Strain: Not on file  Food Insecurity: Not on file  Transportation Needs: Not on file  Physical Activity: Not on file  Stress: Not on file  Social Connections: Not on file  Intimate Partner Violence: Not on file    Current Outpatient Medications on File Prior to Visit  Medication Sig Dispense Refill   B-D ULTRAFINE III SHORT PEN 31G X 8 MM MISC USE AS  DIRECTED 100 each 1   clotrimazole (LOTRIMIN) 1 % cream Apply to both feet and between toes twice daily for 6 weeks. 60 g 1   Continuous Blood Gluc Sensor (FREESTYLE LIBRE 14 DAY SENSOR) MISC APPLLY 1 SENSOR EVERY 14 DAYS FOR USE WITH CONTINUOUS GLUCOSE MONITORING 6 each 17   gabapentin (NEURONTIN) 300 MG capsule TAKE 1 CAPSULE BY MOUTH TWICE A DAY 180 capsule 0   simvastatin (ZOCOR) 40 MG tablet TAKE 1 TABLET BY MOUTH EVERY DAY 90 tablet 0   lisinopril-hydrochlorothiazide (ZESTORETIC) 20-12.5 MG tablet Take 2 tablets by mouth daily. 180 tablet 3   sitaGLIPtin (JANUVIA) 50 MG tablet Take 1 tablet (50 mg total) by mouth daily. 30 tablet 5   No current facility-administered medications on file prior to visit.    No Known Allergies  Family History  Problem Relation Age of Onset   Diabetes Mother    Hypertension Sister    Hypertension Brother     BP (!) 142/88 (BP Location: Right Arm, Patient Position: Sitting, Cuff Size: Normal)   Pulse 85   Ht 5\' 6"  (1.676 m)   Wt 260 lb 12.8 oz (118.3 kg)   SpO2 97%   BMI 42.09 kg/m  Review of Systems He denies hypoglycemia    Objective:   Physical Exam Pulses: dorsalis pedis intact bilat.   MSK: no deformity of the feet CV: 1+ bilat leg edema Skin:  no ulcer on the feet.  normal temp on the feet.  There is hyperpigmentation and shin thickening at the intertrigenous areas of the toes.  Neuro: sensation is intact to touch on the feet.   Ext: there is bilateral onychomycosis of the toenails.    Lab Results  Component Value Date   HGBA1C 8.2 (A) 04/08/2021      Assessment & Plan:  DM: uncontrolled MNG: due for recheck.  Patient Instructions  Please increase the insulin to 36 units each morning.   Let's recheck the ultrasound.  you will receive a phone call, about a day and time for an appointment.   Please come back for a follow-up appointment in January.

## 2021-04-29 NOTE — Patient Instructions (Addendum)
Please increase the insulin to 36 units each morning.   Let's recheck the ultrasound.  you will receive a phone call, about a day and time for an appointment.   Please come back for a follow-up appointment in January.

## 2021-04-30 LAB — COLOGUARD: Cologuard: NEGATIVE

## 2021-06-06 ENCOUNTER — Ambulatory Visit (INDEPENDENT_AMBULATORY_CARE_PROVIDER_SITE_OTHER): Payer: Medicare Other | Admitting: Podiatry

## 2021-06-06 DIAGNOSIS — E1142 Type 2 diabetes mellitus with diabetic polyneuropathy: Secondary | ICD-10-CM | POA: Diagnosis not present

## 2021-06-06 DIAGNOSIS — B353 Tinea pedis: Secondary | ICD-10-CM | POA: Diagnosis not present

## 2021-06-06 DIAGNOSIS — B351 Tinea unguium: Secondary | ICD-10-CM | POA: Diagnosis not present

## 2021-06-06 DIAGNOSIS — M79675 Pain in left toe(s): Secondary | ICD-10-CM

## 2021-06-06 DIAGNOSIS — M79674 Pain in right toe(s): Secondary | ICD-10-CM | POA: Diagnosis not present

## 2021-06-06 MED ORDER — CLOTRIMAZOLE 1 % EX CREA: TOPICAL_CREAM | CUTANEOUS | 1 refills | Status: DC

## 2021-06-09 ENCOUNTER — Encounter: Payer: Self-pay | Admitting: Podiatry

## 2021-06-09 NOTE — Progress Notes (Signed)
Subjective: Alexander Cochran is a pleasant 65 y.o. male patient seen today for at risk foot care with history of diabetic neuropathy and painful elongated mycotic toenails 1-5 bilaterally which are tender when wearing enclosed shoe gear. Pain is relieved with periodic professional debridement.   He states skin is still peeling on both feet.  Patient states their blood glucose was 124 mg/dl today.   PCP is [name], MD. Last visit was: 04/08/2021.  No Known Allergies  Objective: Physical Exam  General: Alexander Cochran is a pleasant 65 y.o. African American male, in NAD. AAO x 3.   Vascular:  CFT delayed b/l LE. Faintly palpable DP pulse(s) RLE. Faintly palpable PT pulse(s) b/l lower extremities. Pedal hair absent. No pain with calf compression b/l. Lower extremity skin temperature gradient within normal limits. No edema noted b/l LE.   Dermatological:  Pedal integument with normal turgor, texture and tone b/l LE. No open wounds b/l. No interdigital macerations b/l. Toenails 1-5 b/l elongated, thickened, discolored with subungual debris. +Tenderness with dorsal palpation of nailplates. No hyperkeratotic or porokeratotic lesions present. Diffuse scaling noted peripherally and plantarly b/l feet.  Mild foot odor. No interdigital macerations.  No blisters, no weeping. No signs of secondary bacterial infection noted.   Musculoskeletal:  Normal muscle strength 5/5 to all lower extremity muscle groups bilaterally. Pes planus deformity noted bilateral LE.   Neurological:  Protective sensation diminished with 10g monofilament b/l. Vibratory sensation diminished b/l.   Assessment and Plan:  1. Pain due to onychomycosis of toenails of both feet   2. Tinea pedis of both feet   3. Diabetic peripheral neuropathy associated with type 2 diabetes mellitus ([place])     Patient was evaluated and treated and all questions answered. Consent given for treatment as described below: -Examined  patient. -Continue diabetic foot care principles: inspect feet daily, monitor glucose as recommended by PCP and/or Endocrinologist, and follow prescribed diet per PCP, Endocrinologist and/or dietician. -Toenails 1-5 b/l were debrided in length and girth with sterile nail nippers and dremel without iatrogenic bleeding.  -For tinea pedis, Rx sent to pharmacy for Clotrimazole Cream 1% to be applied toboth feet twice daily for 6 weeks. -Patient/POA to call should there be question/concern in the interim.  Return in about 3 months (around 09/06/2021).  [name], DPM

## 2021-06-19 LAB — HM DIABETES EYE EXAM

## 2021-06-26 ENCOUNTER — Other Ambulatory Visit: Payer: Self-pay | Admitting: Emergency Medicine

## 2021-06-26 DIAGNOSIS — E1142 Type 2 diabetes mellitus with diabetic polyneuropathy: Secondary | ICD-10-CM

## 2021-06-26 DIAGNOSIS — E785 Hyperlipidemia, unspecified: Secondary | ICD-10-CM

## 2021-06-28 ENCOUNTER — Other Ambulatory Visit: Payer: Self-pay | Admitting: Endocrinology

## 2021-06-28 DIAGNOSIS — E1165 Type 2 diabetes mellitus with hyperglycemia: Secondary | ICD-10-CM

## 2021-07-02 ENCOUNTER — Other Ambulatory Visit (HOSPITAL_COMMUNITY): Payer: Self-pay

## 2021-07-03 ENCOUNTER — Other Ambulatory Visit: Payer: Self-pay | Admitting: Endocrinology

## 2021-07-03 MED ORDER — INSULIN NPH (HUMAN) (ISOPHANE) 100 UNIT/ML ~~LOC~~ SUSP: 36.0000 [IU] | SUBCUTANEOUS | 1 refills | Status: DC

## 2021-07-08 ENCOUNTER — Encounter: Payer: Self-pay | Admitting: Emergency Medicine

## 2021-08-01 ENCOUNTER — Other Ambulatory Visit: Payer: Self-pay

## 2021-08-01 ENCOUNTER — Ambulatory Visit (INDEPENDENT_AMBULATORY_CARE_PROVIDER_SITE_OTHER): Payer: Medicare Other | Admitting: Endocrinology

## 2021-08-01 VITALS — BP 124/86 | HR 64 | Ht 66.0 in | Wt 262.4 lb

## 2021-08-01 DIAGNOSIS — E1165 Type 2 diabetes mellitus with hyperglycemia: Secondary | ICD-10-CM

## 2021-08-01 DIAGNOSIS — Z794 Long term (current) use of insulin: Secondary | ICD-10-CM | POA: Diagnosis not present

## 2021-08-01 DIAGNOSIS — E042 Nontoxic multinodular goiter: Secondary | ICD-10-CM | POA: Diagnosis not present

## 2021-08-01 LAB — POCT GLYCOSYLATED HEMOGLOBIN (HGB A1C): Hemoglobin A1C: 7 % — AB (ref 4.0–5.6)

## 2021-08-01 NOTE — Progress Notes (Signed)
Subjective:    Patient ID: Alexander Cochran, male    DOB: [birth year], 66 y.o.   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   DKA: only once, at dx Severe hypoglycemia: never Pancreatitis: once (2020) Pancreatic imaging: pancreatitis was seen on 2020 CT.   SDOH: He could not afford Jardiance.   Other: He declines multiple daily injections.  Interval history: Meter is downloaded today, and the printout is scanned into the record.  cbg varies from 125-174.  He was rx'ed Jardiance, but did not pick up rx, due to cost.   Pt also has MNG (dx'ed 2021; [place] advised annual f/u; TSH is normal on no rx).    Past Medical History:  Diagnosis Date   Hyperlipidemia    Hypertension     Past Surgical History:  Procedure Laterality Date   CYST EXCISION PERINEAL  1984    Social History   Socioeconomic History   Marital status: Married    Spouse name: Not on file   Number of children: Not on file   Years of education: Not on file   Highest education level: Not on file  Occupational History   Not on file  Tobacco Use   Smoking status: Former    Packs/day: 0.25    Years: 2.00    Pack years: 0.50    Types: Cigarettes    Quit date: 12/28/1973    Years since quitting: 47.6   Smokeless tobacco: Never  Substance and Sexual Activity   Alcohol use: Yes   Drug use: No   Sexual activity: Never  Other Topics Concern   Not on file  Social History Narrative   Not on file   Social Determinants of Health   Financial Resource Strain: Not on file  Food Insecurity: Not on file  Transportation Needs: Not on file  Physical Activity: Not on file  Stress: Not on file  Social Connections: Not on file  Intimate Partner Violence: Not on file    Current Outpatient Medications on File Prior to Visit  Medication Sig Dispense Refill   B-D ULTRAFINE III SHORT PEN 31G X 8 MM  MISC USE AS DIRECTED 100 each 1   clotrimazole (LOTRIMIN) 1 % cream Apply to both feet and between toes twice daily for 6 weeks. 60 g 1   Continuous Blood Gluc Sensor (FREESTYLE LIBRE 14 DAY SENSOR) MISC APPLLY 1 SENSOR EVERY 14 DAYS FOR USE WITH CONTINUOUS GLUCOSE MONITORING 6 each 17   gabapentin (NEURONTIN) 300 MG capsule TAKE 1 CAPSULE BY MOUTH TWICE A DAY 180 capsule 0   insulin NPH Human (NOVOLIN N) 100 UNIT/ML injection Inject 0.36 mLs (36 Units total) into the skin every morning. 40 mL 1   simvastatin (ZOCOR) 40 MG tablet TAKE 1 TABLET BY MOUTH EVERY DAY 90 tablet 0   lisinopril-hydrochlorothiazide (ZESTORETIC) 20-12.5 MG tablet Take 2 tablets by mouth daily. 180 tablet 3   sitaGLIPtin (JANUVIA) 50 MG tablet Take 1 tablet (50 mg total) by mouth daily. 30 tablet 5   No current facility-administered medications on file prior to visit.    No Known Allergies  Family History  Problem Relation Age of Onset   Diabetes Mother    Hypertension Sister    Hypertension Brother     BP 124/86    Pulse 64    Ht 5\' 6"  (1.676 m)  Wt 262 lb 6.4 oz (119 kg)    SpO2 99%    BMI 42.35 kg/m    Review of Systems He denies hypoglycemia.      Objective:   Physical Exam NECK: thyroid is slightly enlarged, with irreg surface, but I cannot appreciate the nodules.     A1c=7.0%    Assessment & Plan:  DM: well-controlled MNG, due to recheck.    Patient Instructions  Please continue the same insulin. Let's recheck the ultrasound.  you will receive a phone call, about a day and time for an appointment.   Please come back for a follow-up appointment in 4 months.

## 2021-08-01 NOTE — Patient Instructions (Addendum)
Please continue the same insulin. Let's recheck the ultrasound.  you will receive a phone call, about a day and time for an appointment.   Please come back for a follow-up appointment in 4 months.

## 2021-08-21 ENCOUNTER — Ambulatory Visit
Admission: RE | Admit: 2021-08-21 | Discharge: 2021-08-21 | Disposition: A | Discharge status: Home / Self Care | Payer: Medicare Other | Source: Ambulatory Visit | Attending: Endocrinology | Admitting: Endocrinology

## 2021-08-21 DIAGNOSIS — E042 Nontoxic multinodular goiter: Secondary | ICD-10-CM

## 2021-08-22 ENCOUNTER — Other Ambulatory Visit: Payer: Self-pay | Admitting: Endocrinology

## 2021-08-22 DIAGNOSIS — E042 Nontoxic multinodular goiter: Secondary | ICD-10-CM

## 2021-08-27 ENCOUNTER — Other Ambulatory Visit: Payer: Self-pay | Admitting: Endocrinology

## 2021-08-27 DIAGNOSIS — E042 Nontoxic multinodular goiter: Secondary | ICD-10-CM

## 2021-09-22 ENCOUNTER — Other Ambulatory Visit: Payer: Self-pay | Admitting: Emergency Medicine

## 2021-09-22 DIAGNOSIS — E1142 Type 2 diabetes mellitus with diabetic polyneuropathy: Secondary | ICD-10-CM

## 2021-09-23 ENCOUNTER — Other Ambulatory Visit: Payer: Self-pay | Admitting: Emergency Medicine

## 2021-09-23 DIAGNOSIS — E785 Hyperlipidemia, unspecified: Secondary | ICD-10-CM

## 2021-09-24 ENCOUNTER — Ambulatory Visit
Admission: RE | Admit: 2021-09-24 | Discharge: 2021-09-24 | Disposition: A | Discharge status: Home / Self Care | Payer: Medicare Other | Source: Ambulatory Visit | Attending: Endocrinology | Admitting: Endocrinology

## 2021-09-24 ENCOUNTER — Other Ambulatory Visit (HOSPITAL_COMMUNITY)
Admission: RE | Admit: 2021-09-24 | Discharge: 2021-09-24 | Disposition: A | Discharge status: Home / Self Care | Payer: Medicare Other | Source: Ambulatory Visit | Attending: Endocrinology | Admitting: Endocrinology

## 2021-09-24 DIAGNOSIS — E042 Nontoxic multinodular goiter: Secondary | ICD-10-CM | POA: Diagnosis present

## 2021-09-25 LAB — CYTOLOGY - NON PAP

## 2021-09-26 ENCOUNTER — Ambulatory Visit (INDEPENDENT_AMBULATORY_CARE_PROVIDER_SITE_OTHER): Payer: Medicare Other | Admitting: Podiatry

## 2021-09-26 ENCOUNTER — Encounter: Payer: Self-pay | Admitting: Podiatry

## 2021-09-26 ENCOUNTER — Other Ambulatory Visit: Payer: Self-pay

## 2021-09-26 ENCOUNTER — Other Ambulatory Visit: Payer: Self-pay | Admitting: *Deleted

## 2021-09-26 DIAGNOSIS — B351 Tinea unguium: Secondary | ICD-10-CM | POA: Diagnosis not present

## 2021-09-26 DIAGNOSIS — E1142 Type 2 diabetes mellitus with diabetic polyneuropathy: Secondary | ICD-10-CM | POA: Diagnosis not present

## 2021-09-26 DIAGNOSIS — M79674 Pain in right toe(s): Secondary | ICD-10-CM | POA: Diagnosis not present

## 2021-09-26 DIAGNOSIS — B353 Tinea pedis: Secondary | ICD-10-CM

## 2021-09-26 DIAGNOSIS — M79675 Pain in left toe(s): Secondary | ICD-10-CM

## 2021-09-26 MED ORDER — CLOTRIMAZOLE 1 % EX CREA: TOPICAL_CREAM | CUTANEOUS | 1 refills | Status: DC

## 2021-10-02 NOTE — Progress Notes (Signed)
Subjective: [name] is a pleasant 66 y.o. male patient seen today for at risk foot care with history of diabetic neuropathy and painful elongated mycotic toenails 1-5 bilaterally which are tender when wearing enclosed shoe gear. Pain is relieved with periodic professional debridement.  ? ?He states skin is improving, but he needs another refill of his clotrimazole cream. ? ?Patient did not check blood glucose this morning. ? ?PCP is Alexander Pollen, MD. Last visit was: 04/08/2021. ? ?No Known Allergies ? ?Objective: ?Physical Exam ? ?General: Alexander Cochran is a pleasant 66 y.o. African American male, in NAD. AAO x 3.  ? ?Vascular:  ?CFT delayed b/l LE. Faintly palpable DP pulse(s) RLE. Faintly palpable PT pulse(s) b/l lower extremities. Pedal hair absent. No pain with calf compression b/l. Lower extremity skin temperature gradient within normal limits. No edema noted b/l LE.  ? ?Dermatological:  ?Pedal integument with normal turgor, texture and tone b/l LE. No open wounds b/l. No interdigital macerations b/l. Toenails 1-5 b/l elongated, thickened, discolored with subungual debris. +Tenderness with dorsal palpation of nailplates. No hyperkeratotic or porokeratotic lesions present. Tinea much improved bilaterally. He has some minor peeling noted..  ? ?Musculoskeletal:  ?Normal muscle strength 5/5 to all lower extremity muscle groups bilaterally. Pes planus deformity noted bilateral LE.  ? ?Neurological:  ?Protective sensation diminished with 10g monofilament b/l. Vibratory sensation diminished b/l.  ? ?Assessment and Plan:  ?1. Pain due to onychomycosis of toenails of both feet   ?2. Tinea pedis of both feet   ?3. Diabetic peripheral neuropathy associated with type 2 diabetes mellitus ([place])   ?  ?-Mycotic toenails 1-5 bilaterally were debrided in length and girth with sterile nail nippers and dremel without incident. ?-Patient/POA to call should there be question/concern in the interim.  ?-For tinea  pedis, refilled Clotrimazole Cream 1% to be applied toboth feet twice daily for 6 weeks. ?-Patient/POA to call should there be question/concern in the interim. ? ?Return in about 3 months (around 12/27/2021). ? ?[name], DPM ? ?

## 2021-10-09 LAB — CYTOLOGY - NON PAP

## 2021-11-04 ENCOUNTER — Other Ambulatory Visit: Payer: Self-pay | Admitting: Emergency Medicine

## 2021-11-04 DIAGNOSIS — E1165 Type 2 diabetes mellitus with hyperglycemia: Secondary | ICD-10-CM

## 2021-11-29 ENCOUNTER — Ambulatory Visit (INDEPENDENT_AMBULATORY_CARE_PROVIDER_SITE_OTHER): Payer: Medicare Other | Admitting: Endocrinology

## 2021-11-29 VITALS — BP 130/88 | HR 63 | Ht 66.0 in | Wt 261.0 lb

## 2021-11-29 DIAGNOSIS — Z794 Long term (current) use of insulin: Secondary | ICD-10-CM | POA: Diagnosis not present

## 2021-11-29 DIAGNOSIS — E1165 Type 2 diabetes mellitus with hyperglycemia: Secondary | ICD-10-CM

## 2021-11-29 LAB — POCT GLYCOSYLATED HEMOGLOBIN (HGB A1C): Hemoglobin A1C: 6.5 % — AB (ref 4.0–5.6)

## 2021-11-29 MED ORDER — LANTUS SOLOSTAR 100 UNIT/ML ~~LOC~~ SOPN: 30.0000 [IU] | PEN_INJECTOR | SUBCUTANEOUS | 1 refills | Status: DC

## 2021-11-29 NOTE — Patient Instructions (Addendum)
Please reduce the Lantus to 30 units each morning.   ?You should have the thyroid ultrasound rechecked early next year.   ?check your blood sugar twice a day.  vary the time of day when you check, between before the 3 meals, and at bedtime.  also check if you have symptoms of your blood sugar being too high or too low.  please keep a record of the readings and bring it to your next appointment here (or you can bring the meter itself).  You can write it on any piece of paper.  please call us sooner if your blood sugar goes below 70, or if most of your readings are over 200. ?On this type of insulin schedule, you should eat meals on a regular schedule.  If a meal is missed or significantly delayed, your blood sugar could go low.   ?You should have an endocrinology follow-up appointment in 4 months.   ? ?

## 2021-11-29 NOTE — Progress Notes (Signed)
? ?Subjective:  ? ? Patient ID: Alexander Cochran, male    DOB: 1956-04-06, 66 y.o.   MRN: [id number] ? 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   ?Therapy: insulin since dx, and Januvia.   ?DKA: only once, at dx ?Severe hypoglycemia: never ?Pancreatitis: once (2020) ?Pancreatic imaging: pancreatitis was seen on 2020 CT.   ?SDOH: He could not afford Jardiance.   ?Other: He declines multiple daily injections.  ?Interval history: Meter is downloaded today, and the printout is scanned into the record.  Cbg's are all in the low to mid-100's.  He takes Lantus 36 units qam.  ?Pt also has MNG (dx'ed 2021; TSH is normal on no rx; 2 bxs in 2023 were cat 2) ?Past Medical History:  ?Diagnosis Date  ? Hyperlipidemia   ? Hypertension   ? ? ?Past Surgical History:  ?Procedure Laterality Date  ? CYST EXCISION PERINEAL  1984  ? ? ?Social History  ? ?Socioeconomic History  ? Marital status: Married  ?  Spouse name: Not on file  ? Number of children: Not on file  ? Years of education: Not on file  ? Highest education level: Not on file  ?Occupational History  ? Not on file  ?Tobacco Use  ? Smoking status: Former  ?  Packs/day: 0.25  ?  Years: 2.00  ?  Pack years: 0.50  ?  Types: Cigarettes  ?  Quit date: 12/28/1973  ?  Years since quitting: 47.9  ? Smokeless tobacco: Never  ?Substance and Sexual Activity  ? Alcohol use: Yes  ? Drug use: No  ? Sexual activity: Never  ?Other Topics Concern  ? Not on file  ?Social History Narrative  ? Not on file  ? ?Social Determinants of Health  ? ?Financial Resource Strain: Not on file  ?Food Insecurity: Not on file  ?Transportation Needs: Not on file  ?Physical Activity: Not on file  ?Stress: Not on file  ?Social Connections: Not on file  ?Intimate Partner Violence: Not on file  ? ? ?Current Outpatient Medications on File Prior to Visit  ?Medication Sig Dispense Refill  ? B-D ULTRAFINE III SHORT PEN 31G X 8 MM MISC USE AS  DIRECTED 100 each 1  ? clotrimazole (LOTRIMIN) 1 % cream Apply to both feet and between toes twice daily for 6 weeks. 60 g 1  ? gabapentin (NEURONTIN) 300 MG capsule TAKE 1 CAPSULE BY MOUTH TWICE A DAY 180 capsule 0  ? simvastatin (ZOCOR) 40 MG tablet TAKE 1 TABLET BY MOUTH EVERY DAY 90 tablet 0  ? lisinopril-hydrochlorothiazide (ZESTORETIC) 20-12.5 MG tablet Take 2 tablets by mouth daily. 180 tablet 3  ? ?No current facility-administered medications on file prior to visit.  ? ? ?No Known Allergies ? ?Family History  ?Problem Relation Age of Onset  ? Diabetes Mother   ? Hypertension Sister   ? Hypertension Brother   ? ? ?BP 130/88 (BP Location: Left Arm, Patient Position: Sitting, Cuff Size: Normal)   Pulse 63   Ht '5\' 6"'$  (1.676 m)   Wt 261 lb (118.4 kg)   SpO2 94%   BMI 42.13 kg/m?  ? ? ?Review of Systems ? ?   ?Objective:  ? Physical Exam ?VITAL SIGNS:  See vs page ?GENERAL: no distress ? ? ?Lab Results  ?Component Value Date  ? HGBA1C 6.5 (A) 11/29/2021  ? ?   ?Assessment & Plan:  ?DM: overcontrolled.  We discussed rx options. He requests to stay with Lantus pen.   ?MNG: we discussed f/u.  ? ?Patient Instructions  ?Please reduce the Lantus to 30 units each morning.   ?You should have the thyroid ultrasound rechecked early next year.   ?check your blood sugar twice a day.  vary the time of day when you check, between before the 3 meals, and at bedtime.  also check if you have symptoms of your blood sugar being too high or too low.  please keep a record of the readings and bring it to your next appointment here (or you can bring the meter itself).  You can write it on any piece of paper.  please call us sooner if your blood sugar goes below 70, or if most of your readings are over 200. ?On this type of insulin schedule, you should eat meals on a regular schedule.  If a meal is missed or significantly delayed, your blood sugar could go low.   ?You should have an endocrinology follow-up appointment in 4 months.    ? ? ? ?

## 2021-12-21 ENCOUNTER — Other Ambulatory Visit: Payer: Self-pay | Admitting: Emergency Medicine

## 2021-12-21 DIAGNOSIS — E785 Hyperlipidemia, unspecified: Secondary | ICD-10-CM

## 2021-12-26 ENCOUNTER — Other Ambulatory Visit: Payer: Self-pay | Admitting: Emergency Medicine

## 2021-12-26 DIAGNOSIS — E1159 Type 2 diabetes mellitus with other circulatory complications: Secondary | ICD-10-CM

## 2022-01-02 ENCOUNTER — Encounter: Payer: Self-pay | Admitting: Podiatry

## 2022-01-02 ENCOUNTER — Ambulatory Visit (INDEPENDENT_AMBULATORY_CARE_PROVIDER_SITE_OTHER): Payer: Medicare Other | Admitting: Podiatry

## 2022-01-02 DIAGNOSIS — B351 Tinea unguium: Secondary | ICD-10-CM | POA: Diagnosis not present

## 2022-01-02 DIAGNOSIS — M79674 Pain in right toe(s): Secondary | ICD-10-CM | POA: Diagnosis not present

## 2022-01-02 DIAGNOSIS — M79675 Pain in left toe(s): Secondary | ICD-10-CM

## 2022-01-02 DIAGNOSIS — E1142 Type 2 diabetes mellitus with diabetic polyneuropathy: Secondary | ICD-10-CM

## 2022-01-06 NOTE — Progress Notes (Signed)
  Subjective:  Patient ID: Alexander Cochran, male    DOB: [birth year],  MRN: [id number]  Alexander Cochran presents to clinic today for at risk foot care with history of diabetic neuropathy and painful elongated mycotic toenails 1-5 bilaterally which are tender when wearing enclosed shoe gear. Pain is relieved with periodic professional debridement.  Patient states blood glucose was 118 mg/dl today. Last known HgA1c was 6.5%.  New problem(s): None.   PCP is [name], MD , and last visit was April 08, 2021.  No Known Allergies  Review of Systems: Negative except as noted in the HPI.  Objective: No changes noted in today's physical examination. General: Alexander Cochran is a pleasant 66 y.o. African American male, in NAD. AAO x 3.   Vascular:  CFT delayed b/l LE. Faintly palpable DP pulse(s) RLE. Faintly palpable PT pulse(s) b/l lower extremities. Pedal hair absent. No pain with calf compression b/l. Lower extremity skin temperature gradient within normal limits. No edema noted b/l LE.   Dermatological:  Pedal integument with normal turgor, texture and tone b/l LE. No open wounds b/l. No interdigital macerations b/l. Toenails 1-5 b/l elongated, thickened, discolored with subungual debris. +Tenderness with dorsal palpation of nailplates. No hyperkeratotic or porokeratotic lesions present.   Musculoskeletal:  Normal muscle strength 5/5 to all lower extremity muscle groups bilaterally. Pes planus deformity noted bilateral LE.   Neurological:  Protective sensation diminished with 10g monofilament b/l. Vibratory sensation diminished b/l.      Latest Ref Rng & Units 11/29/2021   10:59 AM 08/01/2021   10:56 AM 04/08/2021   11:28 AM  Hemoglobin A1C  Hemoglobin-A1c 4.0 - 5.6 % 6.5  7.0  8.2    Assessment/Plan: 1. Pain due to onychomycosis of toenails of both feet   2. Diabetic peripheral neuropathy associated with type 2 diabetes mellitus ([place])     -Examined patient. -No new  findings. No new orders. -Patient to continue soft, supportive shoe gear daily. -Toenails 1-5 b/l were debrided in length and girth with sterile nail nippers and dremel without iatrogenic bleeding.  -Patient/POA to call should there be question/concern in the interim.   Return in about 3 months (around 04/04/2022).  [name], DPM

## 2022-01-10 ENCOUNTER — Other Ambulatory Visit: Payer: Self-pay

## 2022-01-10 DIAGNOSIS — Z794 Long term (current) use of insulin: Secondary | ICD-10-CM

## 2022-01-10 MED ORDER — LANTUS SOLOSTAR 100 UNIT/ML ~~LOC~~ SOPN: 30.0000 [IU] | PEN_INJECTOR | SUBCUTANEOUS | 1 refills | Status: DC

## 2022-01-15 IMAGING — US US THYROID
1 series · 12 of 25 positions shown · non-contrast
Comparison: None.

CLINICAL DATA: 64-year-old male with a history thyroid enlargement

EXAM:
THYROID ULTRASOUND
TECHNIQUE: Ultrasound examination of the thyroid gland and adjacent soft
tissues was performed.

[Series 1: us thyroid · 0.09mm/px · 12 of 46 slices shown]
[im 2/46]
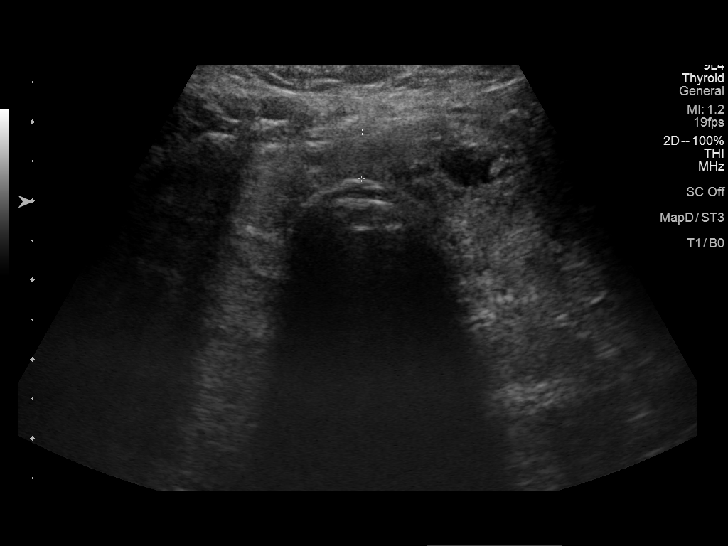
[im 6/46]
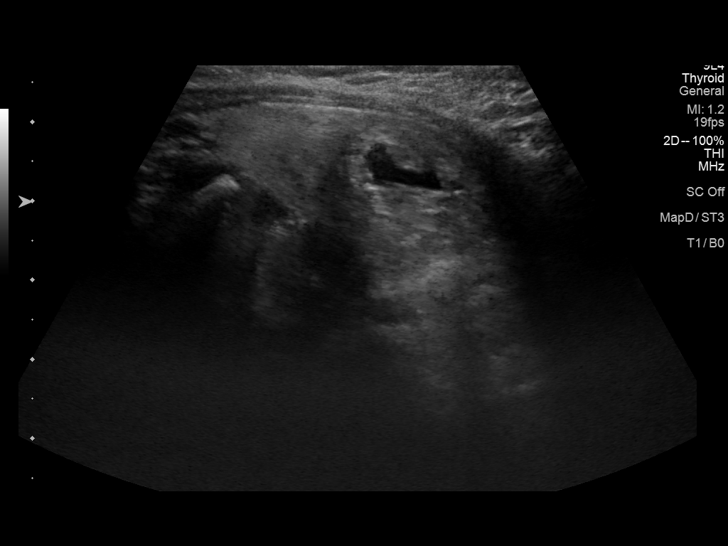
[im 10/46]
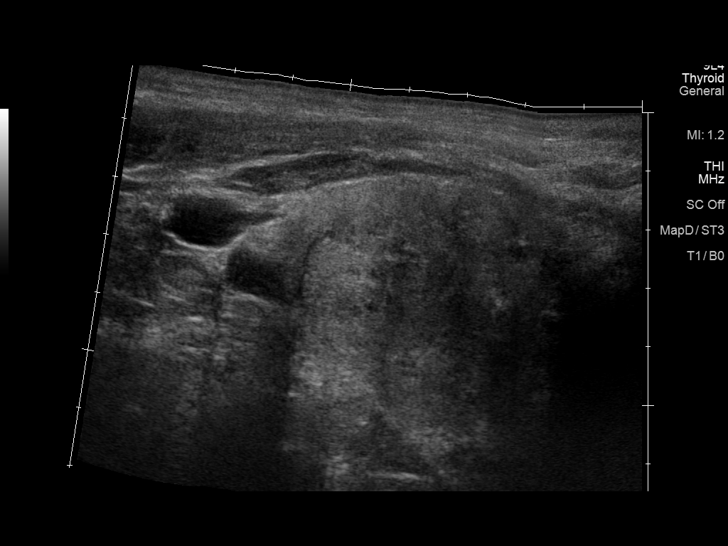
[im 14/46]
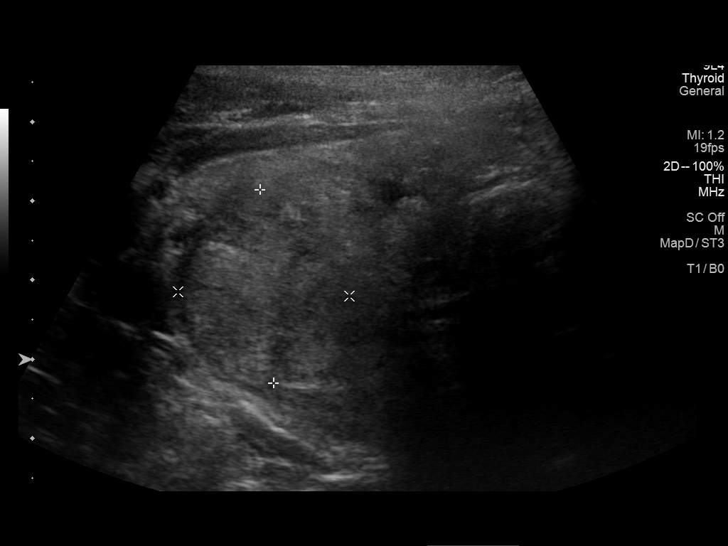
[im 17/46]
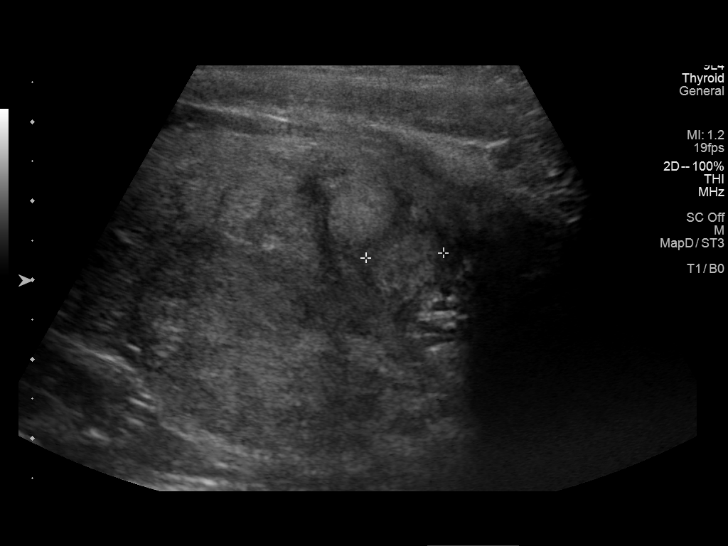
[im 21/46]
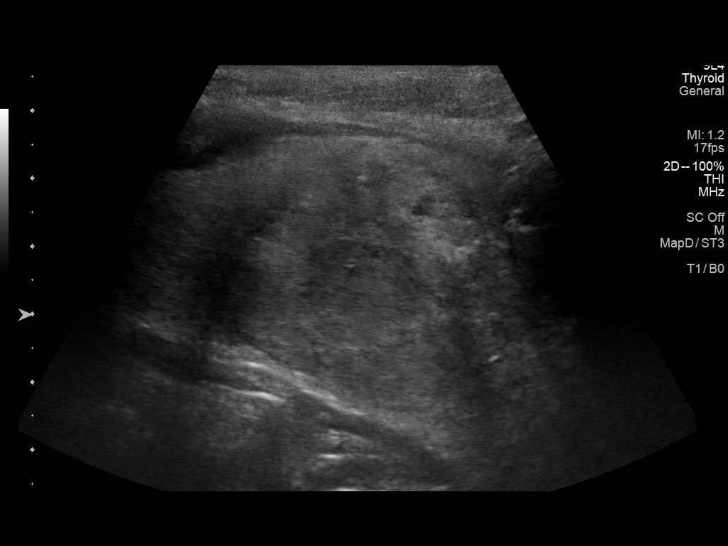
[im 25/46]
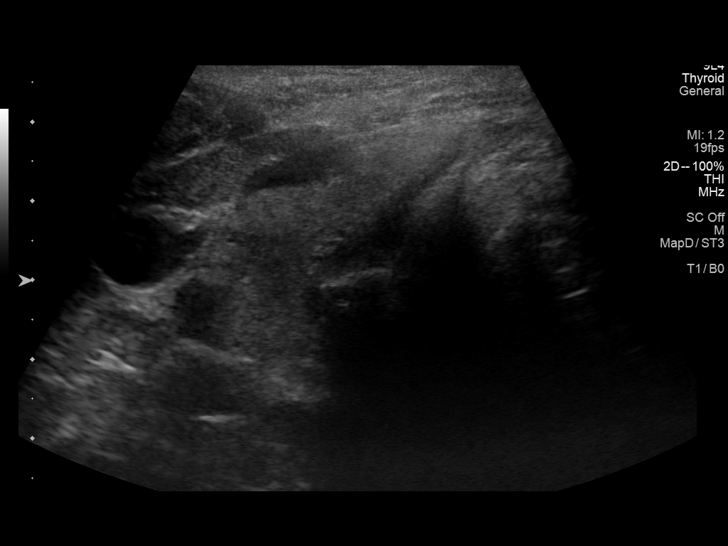
[im 29/46]
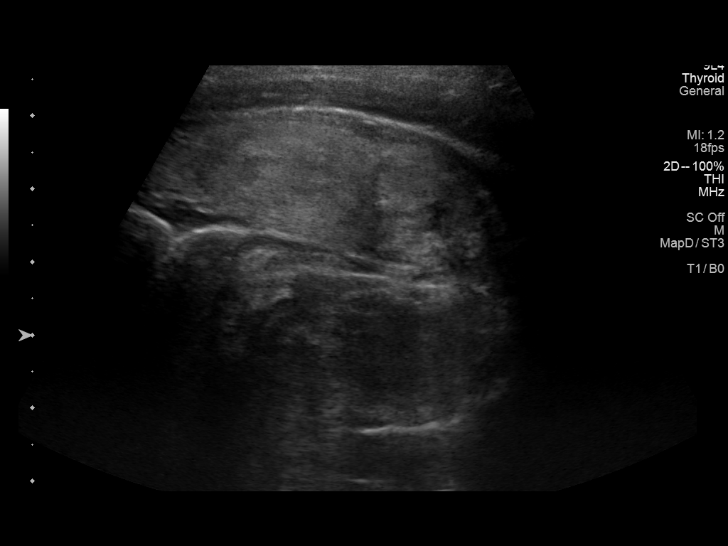
[im 32/46]
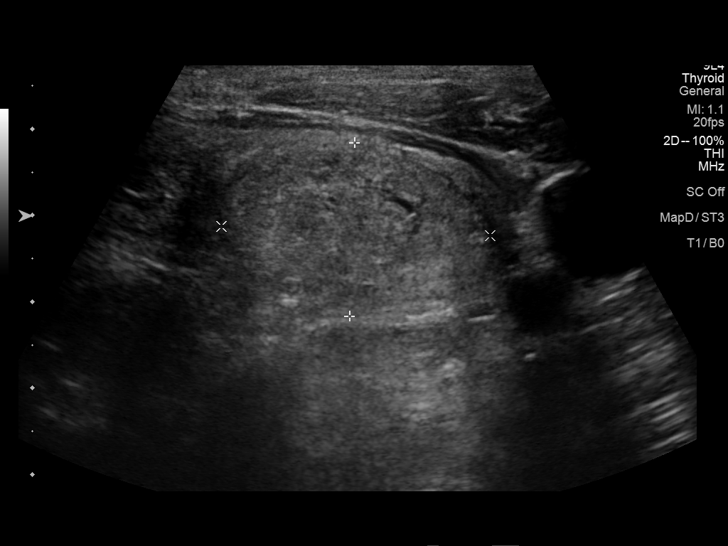
[im 36/46]
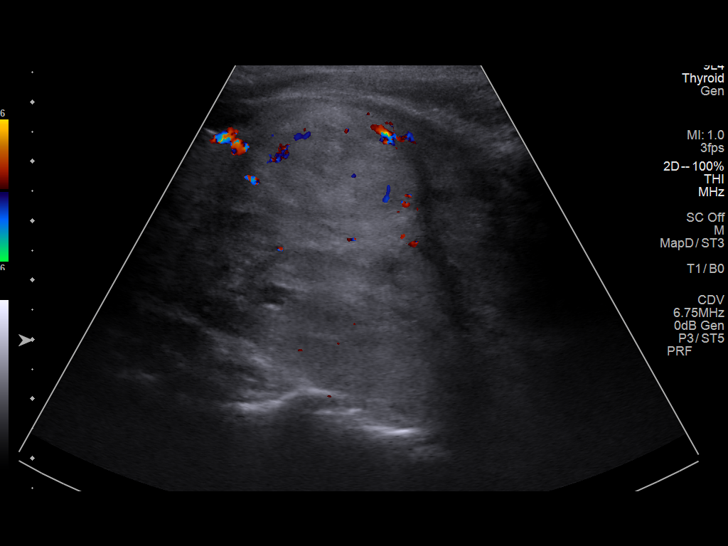
[im 40/46]
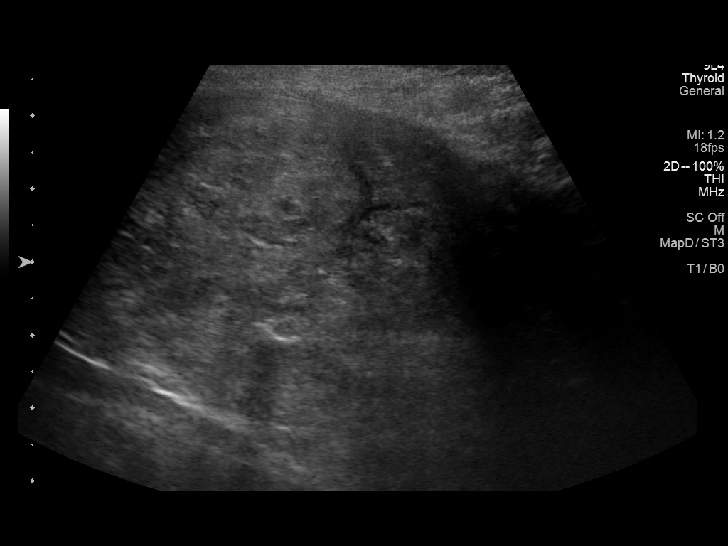
[im 44/46]
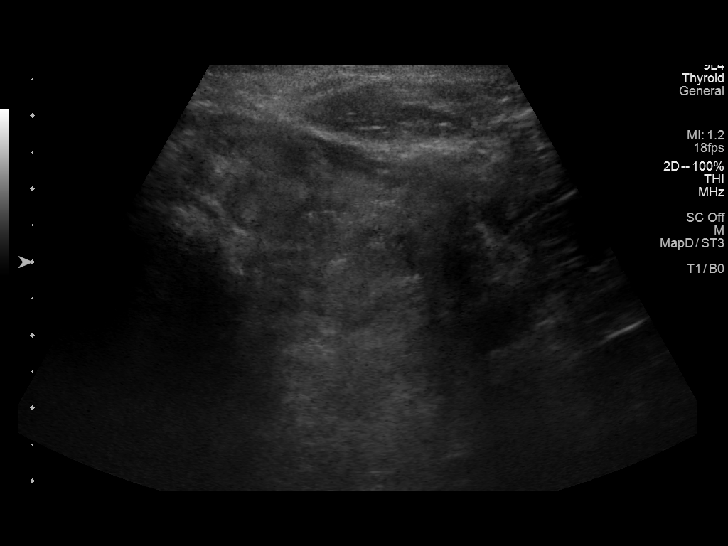

[12 of 25 positions shown; findings below may reference images not displayed]

FINDINGS: Parenchymal Echotexture: Moderately heterogenous

Isthmus: 0.6 cm

Right lobe: 7.4 cm x 4.8 cm x 3.0 cm

Left lobe: 7.7 cm x 5.2 cm x 2.8 cm

_________________________________________________________

Estimated total number of nodules >/= 1 cm: 6-10

Number of spongiform nodules >/=  2 cm not described below (TR1): 0

Number of mixed cystic and solid nodules >/= 1.5 cm not described
below (TR2): 0

_________________________________________________________

Nodule # 1:

Location: Isthmus; mid

Maximum size: 2.1 cm; Other 2 dimensions: 2.0 cm x 1.7 cm

Composition: mixed cystic and solid (1)

Echogenicity: isoechoic (1)

Shape: not taller-than-wide (0)

Margins: ill-defined (0)

Echogenic foci: none (0)

ACR TI-RADS total points: 2.

ACR TI-RADS risk category: TR2 (2 points).

ACR TI-RADS recommendations:

Nodule does not meet criteria for surveillance or biopsy

_________________________________________________________

Nodule # 2:

Location: Right; Mid

Maximum size: 2.8 cm; Other 2 dimensions: 2.5 cm x 2.2 cm

Composition: solid/almost completely solid (2)

Echogenicity: isoechoic (1)

Shape: not taller-than-wide (0)

Margins: ill-defined (0)

Echogenic foci: none (0)

ACR TI-RADS total points: 3.

ACR TI-RADS risk category: TR3 (3 points).

ACR TI-RADS recommendations:

Nodule meets criteria for biopsy

_________________________________________________________

Nodule # 3:

Location: Right; Mid

Maximum size: 1.1 cm; Other 2 dimensions: 1.1 cm x 1.0 cm

Composition: solid/almost completely solid (2)

Echogenicity: isoechoic (1)

Shape: not taller-than-wide (0)

Margins: ill-defined (0)

Echogenic foci: none (0)

ACR TI-RADS total points: 3.

ACR TI-RADS risk category: TR3 (3 points).

ACR TI-RADS recommendations:

Nodule does not meet criteria for surveillance or biopsy

_________________________________________________________

Nodule # 4:

Location: Right; Inferior

Maximum size: 1.0 cm; Other 2 dimensions: 0.9 cm x 0.8 cm

Composition: solid/almost completely solid (2)

Echogenicity: isoechoic (1)

Shape: not taller-than-wide (0)

Margins: ill-defined (0)

Echogenic foci: none (0)

ACR TI-RADS total points: 3.

ACR TI-RADS risk category: TR3 (3 points).

ACR TI-RADS recommendations:

Nodule does not meet criteria for surveillance or biopsy

_________________________________________________________

Nodule # 5:

Location: Left; Superior

Maximum size: 3.3 cm; Other 2 dimensions: 3.1 cm x 2.0 cm

Composition: solid/almost completely solid (2)

Echogenicity: isoechoic (1)

Shape: not taller-than-wide (0)

Margins: ill-defined (0)

Echogenic foci: none (0)

ACR TI-RADS total points: 3.

ACR TI-RADS risk category: TR3 (3 points).

ACR TI-RADS recommendations:

Nodule meets criteria for biopsy

_________________________________________________________

Nodule # 6:

Location: Left; Mid

Maximum size: 4.4 cm; Other 2 dimensions: 2.9 cm x 2.4 cm

Composition: solid/almost completely solid (2)

Echogenicity: isoechoic (1)

Shape: not taller-than-wide (0)

Margins: ill-defined (0)

Echogenic foci: none (0)

ACR TI-RADS total points: 3.

ACR TI-RADS risk category: TR3 (3 points).

ACR TI-RADS recommendations:

Nodule meets criteria for biopsy

_________________________________________________________

No adenopathy
IMPRESSION: Multinodular enlarged thyroid.

Left superior thyroid nodule (labeled 5, 3.3 cm, TR 3) and the left
mid thyroid nodule (labeled 6, TR 3, 4.4 cm) both meet criteria for
surveillance, as designated by the newly established ACR TI-RADS
criteria. Surveillance ultrasound study recommended to be performed
annually up to 5 years.

Right mid thyroid nodule (TR 3, 2.8 cm) meets criteria for
biopsy/surveillance, as designated by the newly established ACR
TI-RADS criteria. Surveillance ultrasound study recommended to be
performed, with consideration of future biopsy.

Recommendations follow those established by the new ACR TI-RADS
criteria ([HOSPITAL] 9023;[DATE]).

## 2022-03-27 ENCOUNTER — Other Ambulatory Visit: Payer: Self-pay | Admitting: Endocrinology

## 2022-03-27 DIAGNOSIS — E1165 Type 2 diabetes mellitus with hyperglycemia: Secondary | ICD-10-CM

## 2022-03-27 DIAGNOSIS — E042 Nontoxic multinodular goiter: Secondary | ICD-10-CM

## 2022-03-28 ENCOUNTER — Other Ambulatory Visit (INDEPENDENT_AMBULATORY_CARE_PROVIDER_SITE_OTHER): Payer: Medicare Other

## 2022-03-28 DIAGNOSIS — Z794 Long term (current) use of insulin: Secondary | ICD-10-CM | POA: Diagnosis not present

## 2022-03-28 DIAGNOSIS — E1165 Type 2 diabetes mellitus with hyperglycemia: Secondary | ICD-10-CM | POA: Diagnosis not present

## 2022-03-28 DIAGNOSIS — E042 Nontoxic multinodular goiter: Secondary | ICD-10-CM | POA: Diagnosis not present

## 2022-03-28 LAB — LIPID PANEL
Cholesterol: 136 mg/dL (ref 0–200)
HDL: 31.7 mg/dL — ABNORMAL LOW (ref >39.00)
NonHDL: 104.67
Total CHOL/HDL Ratio: 4
Triglycerides: 348 mg/dL — ABNORMAL HIGH (ref 0.0–149.0)
VLDL: 69.6 mg/dL — ABNORMAL HIGH (ref 0.0–40.0)

## 2022-03-28 LAB — COMPREHENSIVE METABOLIC PANEL
ALT: 14 U/L (ref 0–53)
AST: 17 U/L (ref 0–37)
Albumin: 4.3 g/dL (ref 3.5–5.2)
Alkaline Phosphatase: 61 U/L (ref 39–117)
BUN: 19 mg/dL (ref 6–23)
CO2: 29 mEq/L (ref 19–32)
Calcium: 9.8 mg/dL (ref 8.4–10.5)
Chloride: 105 mEq/L (ref 96–112)
Creatinine, Ser: 1.78 mg/dL — ABNORMAL HIGH (ref 0.40–1.50)
GFR: 39.62 mL/min — ABNORMAL LOW (ref >60.00)
Glucose, Bld: 151 mg/dL — ABNORMAL HIGH (ref 70–99)
Potassium: 3.7 mEq/L (ref 3.5–5.1)
Sodium: 144 mEq/L (ref 135–145)
Total Bilirubin: 0.5 mg/dL (ref 0.2–1.2)
Total Protein: 7.5 g/dL (ref 6.0–8.3)

## 2022-03-28 LAB — T4, FREE: Free T4: 0.71 ng/dL (ref 0.60–1.60)

## 2022-03-28 LAB — LDL CHOLESTEROL, DIRECT: Direct LDL: 75 mg/dL

## 2022-03-28 LAB — TSH: TSH: 1 u[IU]/mL (ref 0.35–5.50)

## 2022-03-28 LAB — HEMOGLOBIN A1C: Hgb A1c MFr Bld: 6.6 % — ABNORMAL HIGH (ref 4.6–6.5)

## 2022-04-04 ENCOUNTER — Encounter: Payer: Self-pay | Admitting: Endocrinology

## 2022-04-04 ENCOUNTER — Ambulatory Visit (INDEPENDENT_AMBULATORY_CARE_PROVIDER_SITE_OTHER): Payer: Medicare Other | Admitting: Endocrinology

## 2022-04-04 VITALS — BP 150/74 | HR 81 | Ht 66.0 in | Wt 257.0 lb

## 2022-04-04 DIAGNOSIS — Z794 Long term (current) use of insulin: Secondary | ICD-10-CM | POA: Diagnosis not present

## 2022-04-04 DIAGNOSIS — E785 Hyperlipidemia, unspecified: Secondary | ICD-10-CM

## 2022-04-04 DIAGNOSIS — E1165 Type 2 diabetes mellitus with hyperglycemia: Secondary | ICD-10-CM | POA: Diagnosis not present

## 2022-04-04 DIAGNOSIS — E1169 Type 2 diabetes mellitus with other specified complication: Secondary | ICD-10-CM

## 2022-04-04 LAB — MICROALBUMIN / CREATININE URINE RATIO
Creatinine,U: 117.1 mg/dL
Microalb Creat Ratio: 0.9 mg/g (ref 0.0–30.0)
Microalb, Ur: 1 mg/dL (ref 0.0–1.9)

## 2022-04-04 MED ORDER — RYBELSUS 7 MG PO TABS: 1.0000 | ORAL_TABLET | Freq: Every day | ORAL | 3 refills | Status: DC

## 2022-04-04 NOTE — Patient Instructions (Addendum)
Rybelsus improves blood sugar control as well as can help with weight loss and reduces cardiovascular events. Need to take the capsules on empty stomach 30 minutes before breakfast with 4 ounces of water daily.   You may feel more fullness at mealtimes and try to keep portions at meals smaller Some people may have nausea or even vomiting that may occur in the first few days; usually the symptoms go away with time. Please call if nausea or vomiting does not improve within 2 weeks   Reduce insulin by 4 if sugar <110 in am  Check blood sugars on waking up 5 days a week  Also check blood sugars about 2 hours after meals and do this after different meals by rotation  Recommended blood sugar levels on waking up are 90-130 and about 2 hours after meal is 130-160  Please bring your blood sugar monitor to each visit, thank you  Start treadmill

## 2022-04-04 NOTE — Progress Notes (Unsigned)
Patient ID: Alexander Cochran, male   DOB: [birth year], 66 y.o.   MRN: [id number]           Reason for Appointment: Type II Diabetes follow-up   History of Present Illness   Diagnosis date: 2020  Previous history:  He was initially diagnosed with diabetes when he was admitted for pancreatitis with hyperosmolar state and renal failure, glucose over 1200  Non-insulin hypoglycemic drugs previously used: Jardiance, Januvia >1250  Insulin was started in 2020 Previously used NPH  A1c range in the last few years is:  Recent history:     Non-insulin hypoglycemic drugs: None      Insulin regimen: Lantus 30 units in a.m. daily    Side effects from medications: None  Current self management, blood sugar patterns and problems identified:  A1c is 6.6 and stable Currently he is using only fingerstick monitoring using his freestyle libre reader as he could not afford the co-pay for the sensors which he is obtaining from the pharmacy Mostly checking blood sugars in the mornings and only a few available on his download today Previously had been on as much as 45 units of insulin once a day and more recently has been taking 30 units daily No hypoglycemia reported with this Presumably because of renal disc of function he has never been on metformin  Exercise: No programmed exercise, previously using treadmill  Diet management:     Hypoglycemia:  none    Glucometer: Freestyle libre/neo       Blood Glucose readings from meter download:   PRE-MEAL Fasting Lunch Dinner Bedtime Overall  Glucose range: 116-135      Mean/median:        POST-MEAL PC Breakfast PC Lunch PC Dinner  Glucose range:   ?  Mean/median:       Dietician visit: Most recent:      Weight control:  Wt Readings from Last 3 Encounters:  04/04/22 257 lb (116.6 kg)  11/29/21 261 lb (118.4 kg)  08/01/21 262 lb 6.4 oz (119 kg)            Diabetes labs:  Lab Results  Component Value Date   HGBA1C 6.6 (H)  03/28/2022   HGBA1C 6.5 (A) 11/29/2021   HGBA1C 7.0 (A) 08/01/2021   Lab Results  Component Value Date   MICROALBUR 1.0 04/04/2022   LDLCALC 73 06/27/2020   CREATININE 1.78 (H) 03/28/2022    No results found for: "FRUCTOSAMINE"   Allergies as of 04/04/2022   No Known Allergies      Medication List        Accurate as of April 04, 2022  8:57 PM. If you have any questions, ask your nurse or doctor.          B-D ULTRAFINE III SHORT PEN 31G X 8 MM Misc Generic drug: Insulin Pen Needle USE AS DIRECTED   clotrimazole 1 % cream Commonly known as: LOTRIMIN Apply to both feet and between toes twice daily for 6 weeks.   gabapentin 300 MG capsule Commonly known as: NEURONTIN TAKE 1 CAPSULE BY MOUTH TWICE A DAY   Lantus SoloStar 100 UNIT/ML Solostar Pen Generic drug: insulin glargine Inject 30 Units into the skin every morning. And pen needles 1/day   lisinopril-hydrochlorothiazide 20-12.5 MG tablet Commonly known as: ZESTORETIC TAKE 2 TABLETS BY MOUTH DAILY   Rybelsus 7 MG Tabs Generic drug: Semaglutide Take 1 tablet by mouth daily before breakfast. Take 30 minutes before breakfast with 4 oz. water Started by:  Elayne Snare, MD   simvastatin 40 MG tablet Commonly known as: ZOCOR TAKE 1 TABLET BY MOUTH EVERY DAY        Allergies: No Known Allergies  Past Medical History:  Diagnosis Date   Hyperlipidemia    Hypertension     Past Surgical History:  Procedure Laterality Date   CYST EXCISION PERINEAL  1984    Family History  Problem Relation Age of Onset   Diabetes Mother    Hypertension Sister    Hypertension Brother     Social History:  reports that he quit smoking about 48 years ago. His smoking use included cigarettes. He has a 0.50 pack-year smoking history. He has never used smokeless tobacco. He reports current alcohol use. He reports that he does not use drugs.  Review of Systems:  Last diabetic eye exam date  Last urine microalbumin  date:  Last foot exam date:  Symptoms of neuropathy: pains, burning  Hypertension:   ACE/ARB medication: Lisinopril 20 mg  BP Readings from Last 3 Encounters:  04/04/22 (!) 150/74  11/29/21 130/88  08/01/21 124/86   Lab Results  Component Value Date   CREATININE 1.78 (H) 03/28/2022   CREATININE 1.69 (H) 04/08/2021   CREATININE 1.74 (H) 06/27/2020    Lipid management: Has been treated by his PCP with simvastatin 40 mg    Lab Results  Component Value Date   CHOL 136 03/28/2022   CHOL 125 04/08/2021   CHOL 168 06/27/2020   Lab Results  Component Value Date   HDL 31.70 (L) 03/28/2022   HDL 33.00 (L) 04/08/2021   HDL 35 (L) 06/27/2020   Lab Results  Component Value Date   LDLCALC 73 06/27/2020   LDLCALC 51 09/26/2019   LDLCALC 49 12/21/2018   Lab Results  Component Value Date   TRIG 348.0 (H) 03/28/2022   TRIG 215.0 (H) 04/08/2021   TRIG 378 (H) 06/27/2020   Lab Results  Component Value Date   CHOLHDL 4 03/28/2022   CHOLHDL 4 04/08/2021   CHOLHDL 4.8 06/27/2020   Lab Results  Component Value Date   LDLDIRECT 75.0 03/28/2022   LDLDIRECT 65.0 04/08/2021    Unable to calculate Fibrosis 4 Score. Requires ALT, AST, and platelet count within the last 6 months.      Examination:   BP (!) 150/74   Pulse 81   Ht '5\' 6"'$  (1.676 m)   Wt 257 lb (116.6 kg)   SpO2 95%   BMI 41.48 kg/m   Body mass index is 41.48 kg/m.    ASSESSMENT/ PLAN:    Diabetes type 2:   Current regimen:  See history of present illness for detailed discussion of current diabetes management, blood sugar patterns and problems identified  A1c is  Recommendations:  Start checking blood sugars after meals consistently instead of just in the morning at least once a day by rotation after different meals Discussed blood sugar targets after meals to be at least under 180 and preferably under 160 Discussed the importance of significantly weight loss to control his severe obesity with a  target eventually of losing at least 10% of his body weight for multiple benefits including cardiovascular risk reduction Trial of a GLP-1 drug to help him with optimal blood sugar control, potentially weaning him off insulin, weight loss and cardiovascular risk reduction Prefers an oral agent instead of injectable so we will start him on Rybelsus 3 mg daily Sample of this was given Discussed with the patient the nature of  GLP-1 drugs, the actions on insulin secretion, slowing stomach emptying, reduction of appetite and reduced liver glucose production Explained that Rybelsus improves blood sugar control as well as produces weight loss and reduces cardiovascular events. Explained possible side effects especially nausea and vomiting that may occur in the first few days; usually side effects improve with time.  Patient to call if nausea or vomiting does not improve within 2 weeks Instructed to take the capsules on empty stomach 30 minutes before breakfast with 4 ounces of water daily.   Patient education material given After 30 days he will start 7 mg daily He will also start regular exercise program since he has a treadmill available Consultation for diabetes education with CDE/nutritionist for detailed diabetes education, meal planning advice and management of obesity INSULIN: For now we will continue 30 units but likely will need to start reducing the dose Once the blood sugars are below about 110 he can start reducing the insulin by 4 units every week as long as blood sugars are staying within target range  HYPERCHOLESTEROLEMIA: Recently well controlled with simvastatin 40 mg which she will continue long-term  HYPERTENSION: Appears well controlled usually but blood pressure is relatively higher today and he will follow-up with PCP  Patient Instructions  Rybelsus improves blood sugar control as well as can help with weight loss and reduces cardiovascular events. Need to take the capsules on  empty stomach 30 minutes before breakfast with 4 ounces of water daily.   You may feel more fullness at mealtimes and try to keep portions at meals smaller Some people may have nausea or even vomiting that may occur in the first few days; usually the symptoms go away with time. Please call if nausea or vomiting does not improve within 2 weeks   Reduce insulin by 4 if sugar <110 in am  Check blood sugars on waking up 5 days a week  Also check blood sugars about 2 hours after meals and do this after different meals by rotation  Recommended blood sugar levels on waking up are 90-130 and about 2 hours after meal is 130-160  Please bring your blood sugar monitor to each visit, thank you  Start treadmill   Elayne Snare 04/04/2022, 8:57 PM

## 2022-04-10 ENCOUNTER — Encounter: Payer: Self-pay | Admitting: Podiatry

## 2022-04-10 ENCOUNTER — Ambulatory Visit (INDEPENDENT_AMBULATORY_CARE_PROVIDER_SITE_OTHER): Payer: Medicare Other | Admitting: Podiatry

## 2022-04-10 DIAGNOSIS — M79674 Pain in right toe(s): Secondary | ICD-10-CM

## 2022-04-10 DIAGNOSIS — E1142 Type 2 diabetes mellitus with diabetic polyneuropathy: Secondary | ICD-10-CM

## 2022-04-10 DIAGNOSIS — M79675 Pain in left toe(s): Secondary | ICD-10-CM

## 2022-04-10 DIAGNOSIS — B351 Tinea unguium: Secondary | ICD-10-CM

## 2022-04-14 NOTE — Progress Notes (Signed)
  Subjective:  Patient ID: Alexander Cochran, male    DOB: [birth year],  MRN: [id number]  RAWLIN REAUME presents to clinic today for at risk foot care with history of diabetic neuropathy and painful thick toenails that are difficult to trim. Pain interferes with ambulation. Aggravating factors include wearing enclosed shoe gear. Pain is relieved with periodic professional debridement.  Patient states blood glucose was 136 mg/dl today. Last known  HgA1c was 6.5%.    New problem(s): None.   PCP is [name], MD , and last visit was September, 2022.  No Known Allergies  Review of Systems: Negative except as noted in the HPI.  Objective: No changes noted in today's physical examination.  Alexander Cochran is a pleasant 66 y.o. male in NAD. AAO x 3.  Vascular:  CFT delayed b/l LE. Faintly palpable DP pulse(s) RLE. Faintly palpable PT pulse(s) b/l lower extremities. Pedal hair absent. No pain with calf compression b/l. Lower extremity skin temperature gradient within normal limits. No edema noted b/l LE.   Dermatological:  Pedal integument with normal turgor, texture and tone b/l LE. No open wounds b/l. No interdigital macerations b/l. Toenails 1-5 b/l elongated, thickened, discolored with subungual debris. +Tenderness with dorsal palpation of nailplates. No hyperkeratotic or porokeratotic lesions present.   Musculoskeletal:  Normal muscle strength 5/5 to all lower extremity muscle groups bilaterally. Pes planus deformity noted bilateral LE.   Neurological:  Protective sensation diminished with 10g monofilament b/l. Vibratory sensation diminished b/l.   Assessment/Plan: 1. Pain due to onychomycosis of toenails of both feet   2. Diabetic peripheral neuropathy associated with type 2 diabetes mellitus ([place])     -Patient was evaluated and treated. All patient's and/or POA's questions/concerns answered on today's visit. -Mycotic toenails 1-5 bilaterally were debrided in length and  girth with sterile nail nippers and dremel. Pinpoint bleeding of bilateral great toes addressed with Lumicain Hemostatic Solution, cleansed with alcohol. triple antibiotic ointment applied. No further treatment required by patient/caregiver. -Patient/POA to call should there be question/concern in the interim.   Return in about 3 months (around 07/10/2022).  [name], DPM

## 2022-05-14 ENCOUNTER — Other Ambulatory Visit (INDEPENDENT_AMBULATORY_CARE_PROVIDER_SITE_OTHER): Payer: Medicare Other

## 2022-05-14 ENCOUNTER — Telehealth: Payer: Self-pay

## 2022-05-14 ENCOUNTER — Other Ambulatory Visit (HOSPITAL_COMMUNITY): Payer: Self-pay

## 2022-05-14 DIAGNOSIS — E1165 Type 2 diabetes mellitus with hyperglycemia: Secondary | ICD-10-CM | POA: Diagnosis not present

## 2022-05-14 DIAGNOSIS — Z794 Long term (current) use of insulin: Secondary | ICD-10-CM

## 2022-05-14 LAB — GLUCOSE, RANDOM: Glucose, Bld: 105 mg/dL — ABNORMAL HIGH (ref 70–99)

## 2022-05-14 NOTE — Telephone Encounter (Signed)
Patient states at his last visit on 9/8 he was given a sample of Rybelsus and was due for a refill on 10/8. Pt states his insurance doesn't cover this medication and the out of pocket expense is $700. Pt would like to know if there is a possible alternative. Please advise

## 2022-05-15 ENCOUNTER — Other Ambulatory Visit (HOSPITAL_COMMUNITY): Payer: Self-pay

## 2022-05-15 LAB — FRUCTOSAMINE: Fructosamine: 273 umol/L (ref 0–285)

## 2022-05-20 NOTE — Telephone Encounter (Signed)
Attempted to call patient no answer

## 2022-05-21 ENCOUNTER — Ambulatory Visit (INDEPENDENT_AMBULATORY_CARE_PROVIDER_SITE_OTHER): Payer: Medicare Other | Admitting: Endocrinology

## 2022-05-21 ENCOUNTER — Encounter: Payer: Self-pay | Admitting: Endocrinology

## 2022-05-21 VITALS — BP 146/90 | HR 67 | Ht 66.0 in | Wt 248.0 lb

## 2022-05-21 DIAGNOSIS — E1165 Type 2 diabetes mellitus with hyperglycemia: Secondary | ICD-10-CM | POA: Diagnosis not present

## 2022-05-21 DIAGNOSIS — Z794 Long term (current) use of insulin: Secondary | ICD-10-CM | POA: Diagnosis not present

## 2022-05-21 NOTE — Patient Instructions (Addendum)
Check blood sugars on waking up 3-4 days a week  Also check blood sugars about 2 hours after meals and do this after different meals by rotation  Recommended blood sugar levels on waking up are 90-130 and about 2 hours after meal is 130-160  Please bring your blood sugar monitor to each visit, thank you  Call if sugar <90 on Rybelsus

## 2022-05-21 NOTE — Progress Notes (Signed)
Patient ID: ZAIDAN KEEBLE, male   DOB: 13-Jun-1956, 66 y.o.   MRN: [id number]           Reason for Appointment: Type II Diabetes follow-up   History of Present Illness   Diagnosis date: 2020  Previous history:  He was initially diagnosed with diabetes when he was admitted for pancreatitis with hyperosmolar state and renal failure, glucose over 1200  Non-insulin hypoglycemic drugs previously used: Jardiance, Januvia  Insulin was started in 2020 Previously used NPH  A1c range in the last few years is: 5.9-9.9  Recent history:     Non-insulin hypoglycemic drugs: None      Insulin regimen: Lantus 30 units in a.m. daily    Side effects from medications: None  Current self management, blood sugar patterns and problems identified:  A1c is 6.6 and stable, fructosamine is 273 He was told to start using Rybelsus but he cannot afford it Also previously could not afford Jardiance He was told to look into the freestyle libre system but still has not received the supplies from DME He is forgetting to check his blood sugars and mostly checking fasting readings which are similar to before He was referred to the nutritionist but he did not respond to the request to make appointments However he has started walking on his treadmill and starting to lose weight He thinks he is cutting back on portions No hypoglycemic symptoms during the day and lab glucose was 105 midmorning Because of renal dysfunction he has never been on metformin  Exercise: programmed exercise, using treadmill  Diet management: Not following any specific meal plan, avoids drinks with sugars     Hypoglycemia:  none    Glucometer: Freestyle libre/neo       Blood Glucose readings from meter download: Fasting readings range between 122 and 136 with 1 reading of 197 Previously  PRE-MEAL Fasting Lunch Dinner Bedtime Overall  Glucose range: 116-135      Mean/median:     ?   POST-MEAL PC Breakfast PC Lunch PC Dinner   Glucose range:   ?  Mean/median:       Dietician visit: Most recent: None     Weight control:  Wt Readings from Last 3 Encounters:  05/21/22 248 lb (112.5 kg)  04/04/22 257 lb (116.6 kg)  11/29/21 261 lb (118.4 kg)            Diabetes labs:  Lab Results  Component Value Date   HGBA1C 6.6 (H) 03/28/2022   HGBA1C 6.5 (A) 11/29/2021   HGBA1C 7.0 (A) 08/01/2021   Lab Results  Component Value Date   MICROALBUR 1.0 04/04/2022   LDLCALC 73 06/27/2020   CREATININE 1.78 (H) 03/28/2022    Lab Results  Component Value Date   FRUCTOSAMINE 273 05/14/2022     Allergies as of 05/21/2022   No Known Allergies      Medication List        Accurate as of May 21, 2022 11:56 AM. If you have any questions, ask your nurse or doctor.          B-D ULTRAFINE III SHORT PEN 31G X 8 MM Misc Generic drug: Insulin Pen Needle USE AS DIRECTED   clotrimazole 1 % cream Commonly known as: LOTRIMIN Apply to both feet and between toes twice daily for 6 weeks.   gabapentin 300 MG capsule Commonly known as: NEURONTIN TAKE 1 CAPSULE BY MOUTH TWICE A DAY   Lantus SoloStar 100 UNIT/ML Solostar Pen Generic drug: insulin  glargine Inject 30 Units into the skin every morning. And pen needles 1/day   lisinopril-hydrochlorothiazide 20-12.5 MG tablet Commonly known as: ZESTORETIC TAKE 2 TABLETS BY MOUTH DAILY   Rybelsus 7 MG Tabs Generic drug: Semaglutide Take 1 tablet by mouth daily before breakfast. Take 30 minutes before breakfast with 4 oz. water   simvastatin 40 MG tablet Commonly known as: ZOCOR TAKE 1 TABLET BY MOUTH EVERY DAY        Allergies: No Known Allergies  Past Medical History:  Diagnosis Date   Hyperlipidemia    Hypertension     Past Surgical History:  Procedure Laterality Date   CYST EXCISION PERINEAL  1984    Family History  Problem Relation Age of Onset   Diabetes Mother    Hypertension Sister    Hypertension Brother     Social History:   reports that he quit smoking about 48 years ago. His smoking use included cigarettes. He has a 0.50 pack-year smoking history. He has never used smokeless tobacco. He reports current alcohol use. He reports that he does not use drugs.  Review of Systems:  Last diabetic eye exam date 11/22  Last urine microalbumin date: 9/23  Last foot exam date: 10/22  Symptoms of neuropathy: pains, burning in the feet, taking gabapentin  Hypertension: Followed by PCP ACE/ARB medication: Lisinopril 20 mg  BP Readings from Last 3 Encounters:  05/21/22 (!) 146/90  04/04/22 (!) 150/74  11/29/21 130/88   Has had stable but abnormal renal function for the last 3 years, not followed by nephrologist  Lab Results  Component Value Date   CREATININE 1.78 (H) 03/28/2022   CREATININE 1.69 (H) 04/08/2021   CREATININE 1.74 (H) 06/27/2020    Lipid management: Has been treated by his PCP with simvastatin 40 mg    Lab Results  Component Value Date   CHOL 136 03/28/2022   CHOL 125 04/08/2021   CHOL 168 06/27/2020   Lab Results  Component Value Date   HDL 31.70 (L) 03/28/2022   HDL 33.00 (L) 04/08/2021   HDL 35 (L) 06/27/2020   Lab Results  Component Value Date   LDLCALC 73 06/27/2020   LDLCALC 51 09/26/2019   LDLCALC 49 12/21/2018   Lab Results  Component Value Date   TRIG 348.0 (H) 03/28/2022   TRIG 215.0 (H) 04/08/2021   TRIG 378 (H) 06/27/2020   Lab Results  Component Value Date   CHOLHDL 4 03/28/2022   CHOLHDL 4 04/08/2021   CHOLHDL 4.8 06/27/2020   Lab Results  Component Value Date   LDLDIRECT 75.0 03/28/2022   LDLDIRECT 65.0 04/08/2021    Unable to calculate Fibrosis 4 Score. Requires ALT, AST, and platelet count within the last 6 months.      Examination:   BP (!) 146/90   Pulse 67   Ht '5\' 6"'$  (1.676 m)   Wt 248 lb (112.5 kg)   SpO2 99%   BMI 40.03 kg/m   Body mass index is 40.03 kg/m.     ASSESSMENT/ PLAN:    Diabetes type 2 with obesity:   Current regimen:  Basal insulin only  See history of present illness for detailed discussion of current diabetes management, blood sugar patterns and problems identified  A1c is 6.6 and fructosamine is 273  He has not made any changes to his regimen as recommended Unable to afford SGLT2 and GLP-1 drugs Does not remember to check his readings after meals as directed Also has not made appointment with dietitian  Recommendations:  Start checking blood sugars at least every other day after meals about 2 hours later and discussed blood sugar targets Will send prescription through [employer] for libre sensor He will be given the patient assistance application for Rybelsus  There are no Patient Instructions on file for this visit.   Elayne Snare 05/21/2022, 11:56 AM

## 2022-05-22 NOTE — Telephone Encounter (Signed)
Spoke with patient yesterday at appt and gave him the application. He will complete and return with his income verification.

## 2022-05-27 ENCOUNTER — Telehealth: Payer: Self-pay | Admitting: Endocrinology

## 2022-05-27 NOTE — Telephone Encounter (Signed)
Patient brought in [employer] Patient Garment/textile technologist for completion.

## 2022-06-06 ENCOUNTER — Encounter: Payer: Medicare Other | Attending: Emergency Medicine | Admitting: Dietician

## 2022-06-06 ENCOUNTER — Encounter: Payer: Self-pay | Admitting: Dietician

## 2022-06-06 VITALS — Ht 66.0 in | Wt 251.0 lb

## 2022-06-06 DIAGNOSIS — E119 Type 2 diabetes mellitus without complications: Secondary | ICD-10-CM | POA: Insufficient documentation

## 2022-06-06 NOTE — Patient Instructions (Addendum)
Consider getting your vitamin B-12 and Vitamin D checked.  Continue to walk on the treadmill as able.  Find other exercises when your feet are hurting such as floor exercise, armchair exercises, light weights.   Balance your lunch:  consider the following and choose fresh fruit  Beans and greens  Bean soup and salad  Leftovers  Large salad with rinsed beans  Consider other meatless meal options for lunch.   Water and water with frozen fruit are great.  Choose these more than juice.  Juice can easily increase your blood sugar.

## 2022-06-06 NOTE — Progress Notes (Addendum)
Diabetes Self-Management Education  Visit Type: First/Initial  Appt. Start Time: 5885 Appt. End Time: 0277  06/06/2022  [name], identified by name and date of birth, is a 66 y.o. male with a diagnosis of Diabetes: Type 2.   ASSESSMENT Patient is here today alone. He would like to learn how to improve his eating habits.    Referral:  Type 2 Diabetes History includes:  Type 2 Diabetes (2020), history of renal failure and pancreatitis, HTN, HLD, neuropathy Medications include:  Rybelsus (stopped due to expense - filled out patient assistance form) Labs include:  A1C 6.6%03/28/2022 and 6.5% 11/29/2021, GFR 39, BUN 19, Creatinine 1.78, Potassium 3.7 , cholesterol 136, HDL 31, LDL 75, Triglycerides 348 on 03/28/2022 CGM:  FreeStyle Libre - currently not using Sleep:  7 hours, sleeps on the floor, recliner or the couch - bed is not comfortable  Weight hx:   66" 251 lbs 06/06/2022   261 lbs 11/29/2021  Patient lives with his wife.  He does the shopping and cooking. 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  Prepares simple meals.  Avoids chips.  Less bread.  Avoids most artificial sweetener (causes nausea) Treadmill at home.  Tries daily but difficulty doing this at times due to foot pain.  Height '5\' 6"'$  (1.676 m), weight 251 lb (113.9 kg). Body mass index is 40.51 kg/m.   Diabetes Self-Management Education - 06/06/22 1329       Visit Information   Visit Type First/Initial      Initial Visit   Diabetes Type Type 2    Date Diagnosed 2020    Are you currently following a meal plan? No    Are you taking your medications as prescribed? Not on Medications      Health Coping   How would you rate your overall health? Good      Psychosocial Assessment   Patient Belief/Attitude about Diabetes Motivated to manage diabetes    What is the hardest part about your diabetes right now, causing you the most concern, or is the most worrisome  to you about your diabetes?   Taking/obtaining medications;Making healty food and beverage choices    Self-care barriers None    Self-management support Doctor's office    Other persons present Patient    Patient Concerns Nutrition/Meal planning    Special Needs None    Preferred Learning Style No preference indicated    Learning Readiness Ready    How often do you need to have someone help you when you read instructions, pamphlets, or other written materials from your doctor or pharmacy? 1 - Never    What is the last grade level you completed in school? 12      Pre-Education Assessment   Patient understands the diabetes disease and treatment process. Needs Instruction    Patient understands incorporating nutritional management into lifestyle. Needs Instruction    Patient undertands incorporating physical activity into lifestyle. Needs Instruction    Patient understands using medications safely. Needs Instruction    Patient understands monitoring blood glucose, interpreting and using results Needs Instruction    Patient understands prevention, detection, and treatment of acute complications. Needs Instruction    Patient understands prevention, detection, and treatment of chronic complications. Needs Instruction    Patient understands how to develop strategies to address psychosocial issues. Needs Instruction    Patient understands how to develop strategies to promote health/change behavior. Needs Instruction      Complications  Last HgB A1C per patient/outside source 6.6 %   03/28/2022   How often do you check your blood sugar? 1-2 times/day    Fasting Blood glucose range (mg/dL) 70-129   129   Postprandial Blood glucose range (mg/dL) 130-179   140-150   Number of hypoglycemic episodes per month 0    Number of hyperglycemic episodes ( >'200mg'$ /dL): Never    Have you had a dilated eye exam in the past 12 months? No   scheduled this month   Have you had a dental exam in the past 12 months?  No    Are you checking your feet? Yes    How many days per week are you checking your feet? 7      Dietary Intake   Breakfast sausage and egg biscuit OR zuchini OR green tomatoes OR hashbrowns, occasional egg and whole wheat garlic bread    Lunch baked or fried egg roll    Snack (afternoon) occasional nuts or fruit    Dinner baked chicken, greens, OR fish, sweet potato, beans, greens    Snack (evening) occasional nuts    Beverage(s) water, water with frozen fruit,  juice, sugar free gingerale, OJ      Activity / Exercise   Activity / Exercise Type Light (walking / raking leaves)    How many days per week do you exercise? 3    How many minutes per day do you exercise? 60    Total minutes per week of exercise 180      Patient Education   Previous Diabetes Education No    Disease Pathophysiology Definition of diabetes, type 1 and 2, and the diagnosis of diabetes;Factors that contribute to the development of diabetes;Explored patient's options for treatment of their diabetes    Healthy Eating Role of diet in the treatment of diabetes and the relationship between the three main macronutrients and blood glucose level;Food label reading, portion sizes and measuring food.;Meal options for control of blood glucose level and chronic complications.;Other (comment)   nutrition and CKD   Being Active Role of exercise on diabetes management, blood pressure control and cardiac health.    Medications Other (comment)   discussed medication and financial issues   Monitoring Identified appropriate SMBG and/or A1C goals.;Yearly dilated eye exam;Daily foot exams    Acute complications Taught prevention, symptoms, and  treatment of hypoglycemia - the 15 rule.;Discussed and identified patients' prevention, symptoms, and treatment of hyperglycemia.    Chronic complications Relationship between chronic complications and blood glucose control      Individualized Goals (developed by patient)   Nutrition General  guidelines for healthy choices and portions discussed    Physical Activity Exercise 5-7 days per week;30 minutes per day    Medications take my medication as prescribed    Monitoring  Consistenly use CGM;Test my blood glucose as discussed    Problem Solving Eating Pattern    Reducing Risk examine blood glucose patterns;do foot checks daily      Post-Education Assessment   Patient understands the diabetes disease and treatment process. Demonstrates understanding / competency    Patient understands incorporating nutritional management into lifestyle. Comprehends key points    Patient undertands incorporating physical activity into lifestyle. Demonstrates understanding / competency    Patient understands using medications safely. Demonstrates understanding / competency    Patient understands monitoring blood glucose, interpreting and using results Demonstrates understanding / competency    Patient understands prevention, detection, and treatment of acute complications. Demonstrates understanding / competency  Patient understands prevention, detection, and treatment of chronic complications. Demonstrates understanding / competency    Patient understands how to develop strategies to address psychosocial issues. Demonstrates understanding / competency    Patient understands how to develop strategies to promote health/change behavior. Comprehends key points      Outcomes   Expected Outcomes Demonstrated interest in learning. Expect positive outcomes    Future DMSE PRN    Program Status Completed             Individualized Plan for Diabetes Self-Management Training:   Learning Objective:  Patient will have a greater understanding of diabetes self-management. Patient education plan is to attend individual and/or group sessions per assessed needs and concerns.   Plan:   Patient Instructions  Consider getting your vitamin B-12 and Vitamin D checked.  Continue to walk on the treadmill as  able.  Find other exercises when your feet are hurting such as floor exercise, armchair exercises, light weights.   Balance your lunch:  consider the following and choose fresh fruit  Beans and greens  Bean soup and salad  Leftovers  Large salad with rinsed beans  Consider other meatless meal options for lunch.   Water and water with frozen fruit are great.  Choose these more than juice.  Juice can easily increase your blood sugar.   Expected Outcomes:  Demonstrated interest in learning. Expect positive outcomes  Education material provided: ADA - How to Thrive: A Guide for Your Journey with Diabetes, Food label handouts, Meal plan card, and Diabetes Resources; [place], NKD national kidney diet - Dish up a Kidney-Friendly Meal for Patients with Chronic Kidney Disease (not on dialysis), CKD stages 3-5 Nutrition Therapy from AND   If problems or questions, patient to contact team via:  Phone  Future DSME appointment: PRN

## 2022-06-12 ENCOUNTER — Ambulatory Visit: Payer: Medicare Other | Admitting: Dietician

## 2022-06-19 ENCOUNTER — Other Ambulatory Visit: Payer: Self-pay | Admitting: Emergency Medicine

## 2022-06-19 DIAGNOSIS — E785 Hyperlipidemia, unspecified: Secondary | ICD-10-CM

## 2022-06-30 ENCOUNTER — Telehealth: Payer: Self-pay | Admitting: Endocrinology

## 2022-06-30 NOTE — Telephone Encounter (Signed)
Patient brought by financial information for form completion today.  The information is in Dr. Ronnie Derby bin folder in front office.

## 2022-06-30 NOTE — Telephone Encounter (Signed)
Received and application has been faxed to [employer] for Rybelsus '7mg'$ .

## 2022-07-03 NOTE — Telephone Encounter (Signed)
Received call from [employer] that they received application but they can only accept Disablilty letter from disability.I tried to call patient no answer. Left vm for patient.

## 2022-07-10 ENCOUNTER — Encounter: Payer: Self-pay | Admitting: Podiatry

## 2022-07-10 ENCOUNTER — Ambulatory Visit (INDEPENDENT_AMBULATORY_CARE_PROVIDER_SITE_OTHER): Payer: Medicare Other | Admitting: Podiatry

## 2022-07-10 VITALS — BP 159/105 | HR 95

## 2022-07-10 DIAGNOSIS — E1142 Type 2 diabetes mellitus with diabetic polyneuropathy: Secondary | ICD-10-CM

## 2022-07-10 DIAGNOSIS — M2011 Hallux valgus (acquired), right foot: Secondary | ICD-10-CM

## 2022-07-10 DIAGNOSIS — M79675 Pain in left toe(s): Secondary | ICD-10-CM | POA: Diagnosis not present

## 2022-07-10 DIAGNOSIS — B351 Tinea unguium: Secondary | ICD-10-CM

## 2022-07-10 DIAGNOSIS — M2141 Flat foot [pes planus] (acquired), right foot: Secondary | ICD-10-CM | POA: Diagnosis not present

## 2022-07-10 DIAGNOSIS — M79674 Pain in right toe(s): Secondary | ICD-10-CM | POA: Diagnosis not present

## 2022-07-10 DIAGNOSIS — B353 Tinea pedis: Secondary | ICD-10-CM | POA: Diagnosis not present

## 2022-07-10 DIAGNOSIS — M2142 Flat foot [pes planus] (acquired), left foot: Secondary | ICD-10-CM

## 2022-07-10 DIAGNOSIS — M2012 Hallux valgus (acquired), left foot: Secondary | ICD-10-CM

## 2022-07-10 DIAGNOSIS — E119 Type 2 diabetes mellitus without complications: Secondary | ICD-10-CM

## 2022-07-10 MED ORDER — KETOCONAZOLE 2 % EX CREA: TOPICAL_CREAM | CUTANEOUS | 1 refills | Status: DC

## 2022-07-10 NOTE — Patient Instructions (Signed)
Ketoconazole Cream What is this medication? KETOCONAZOLE (kee toe KON na zole) treats fungal or yeast infections of the skin. It may also be used to treat seborrheic dermatitis, a condition that causes dry, flaky, and itchy skin. It belongs to a group of medications called antifungals. It will not treat infections caused by bacteria or viruses. This medicine may be used for other purposes; ask your health care provider or pharmacist if you have questions. COMMON BRAND NAME(S): Kuric, Nizoral What should I tell my care team before I take this medication? They need to know if you have any of these conditions: Large areas of burned or damaged skin An unusual or allergic reaction to ketoconazole, other medications, foods, dyes, or preservatives Pregnant or trying to get pregnant Breast-feeding How should I use this medication? This medication is for external use only. Do not take by mouth. Wash your hands before and after use. If you are treating your hands, only wash your hands before use. Do not use on healthy skin or over large areas of skin. Do not get this medication in your eyes. If you do, rinse it out with plenty of cool tap water. Use it as directed on the label at the same time every day. Do not use it more often than directed. Use the medication for the full course as directed by your care team, even if you think you are better. Do not stop using it unless your care team tells you to stop it early. Apply a thin film to the affected area and rub gently. Do not bandage or wrap the skin being treated unless directed to do so by your care team. Talk to your care team about the use of this medication in children. Special care may be needed. Overdosage: If you think you have taken too much of this medicine contact a poison control center or emergency room at once. NOTE: This medicine is only for you. Do not share this medicine with others. What if I miss a dose? If you miss a dose, use it as soon  as you can. If it is almost time for your next dose, use only that dose. Do not use double or extra doses. What may interact with this medication? Interactions are not expected. Do not use any other skin products without telling your care team. This list may not describe all possible interactions. Give your health care provider a list of all the medicines, herbs, non-prescription drugs, or dietary supplements you use. Also tell them if you smoke, drink alcohol, or use illegal drugs. Some items may interact with your medicine. What should I watch for while using this medication? Visit your care team for regular checks on your progress. Tell your care team if your symptoms do not start to get better or if they get worse. After bathing, make sure your skin is very dry. Fungal infections like moist conditions. Do not walk around barefoot. To help prevent reinfection, wear freshly washed cotton, not synthetic, clothing. Tell your care team if you develop sores or blisters that do not heal properly. If your skin infection returns after you stop using this medication, contact your care team. If you are using this medication for jock itch, do not wear underwear that is tight-fitting or made from synthetic fibers such as rayon or nylon. Instead, wear loose-fitting, cotton underwear. Dry the area completely after bathing. If you are using this medication to treat athlete's foot, carefully dry the feet, especially between the toes after bathing.  Do not wear socks made from wool or synthetic materials such as rayon or nylon. Wear clean cotton socks and change them at least once a day. Wear sandals or shoes that are well-ventilated. An absorbent powder, such as talcum powder, may be used to keep the skin dry. Apply the powder to the affected skin in between applications of this medication. What side effects may I notice from receiving this medication? Side effects that you should report to your care team as soon as  possible: Allergic reactions--skin rash, itching, hives, swelling of the face, lips, tongue, or throat Burning, itching, crusting, or peeling of treated skin Side effects that usually do not require medical attention (report to your care team if they continue or are bothersome): Irritation at application site This list may not describe all possible side effects. Call your doctor for medical advice about side effects. You may report side effects to FDA at 1-800-FDA-1088. Where should I keep my medication? Keep out of the reach of children and pets. Store at room temperature between 20 and 25 degrees C (68 and 77 degrees F). Get rid of any unused medication after the expiration date. NOTE: This sheet is a summary. It may not cover all possible information. If you have questions about this medicine, talk to your doctor, pharmacist, or health care provider.  2023 Elsevier/Gold Standard (2007-09-04 00:00:00)   Athlete's Foot Athlete's foot (tinea pedis) is a fungal infection of the skin on your feet. It often occurs on the skin that is between or underneath the toes. It can also occur on the soles of your feet. The infection can spread from person to person (is contagious). It can also spread when a person's bare feet come in contact with the fungus on shower floors or on items such as shoes. What are the causes? This condition is caused by a fungus that grows in warm, moist places. You can get athlete's foot by sharing shoes, shower stalls, towels, and wet floors with someone who is infected. Not washing your feet or changing your socks often enough can also lead to athlete's foot. What increases the risk? This condition is more likely to develop in: Men. People who have a weak body defense system (immune system). People who have diabetes. People who use public showers, such as at a gym. People who wear heavy-duty shoes, such as Environmental manager. Seasons with warm, humid  weather. What are the signs or symptoms? Symptoms of this condition include: Itchy areas between your toes or on the soles of your feet. White, flaky, or scaly areas between your toes or on the soles of your feet. Very itchy small blisters between your toes or on the soles of your feet. Small cuts in your skin. These cuts can become infected. Thick or discolored toenails. How is this diagnosed? This condition may be diagnosed with a physical exam and a review of your medical history. Your health care provider may also take a skin or toenail sample to examine under a microscope. How is this treated? This condition is treated with antifungal medicines. These may be applied as powders, ointments, or creams. In severe cases, an oral antifungal medicine may be given. Follow these instructions at home: Medicines Apply or take over-the-counter and prescription medicines only as told by your health care provider. Apply your antifungal medicine as told by your health care provider. Do not stop using the antifungal even if your condition improves. Foot care Do not scratch your feet. Keep your  feet dry: Wear cotton or wool socks. Change your socks every day or if they become wet. Wear shoes that allow air to flow, such as sandals or canvas tennis shoes. Wash and dry your feet, including the area between your toes. Also, wash and dry your feet: Every day or as told by your health care provider. After exercising. General instructions Do not let others use towels, shoes, nail clippers, or other personal items that touch your feet. Protect your feet by wearing sandals in wet areas, such as locker rooms and shared showers. Keep all follow-up visits. This is important. If you have diabetes, keep your blood sugar under control. Contact a health care provider if: You have a fever. You have swelling, soreness, warmth, or redness in your foot. Your feet are not getting better with treatment. Your  symptoms get worse. You have new symptoms. You have severe pain. Summary Athlete's foot (tinea pedis) is a fungal infection of the skin on your feet. It often occurs on skin that is between or underneath the toes. This condition is caused by a fungus that grows in warm, moist places. Symptoms include white, flaky, or scaly areas between your toes or on the soles of your feet. This condition is treated with antifungal medicines. Keep your feet clean. Always dry them thoroughly. This information is not intended to replace advice given to you by your health care provider. Make sure you discuss any questions you have with your health care provider. Document Revised: 11/04/2020 Document Reviewed: 11/04/2020 Elsevier Patient Education  [place].

## 2022-07-10 NOTE — Progress Notes (Signed)
ANNUAL DIABETIC FOOT EXAM  Subjective: Alexander Cochran presents today for annual diabetic foot examination.  Chief Complaint  Patient presents with   Nail Problem    Alexander Ferguson, MD Last Visit- September 2022 Blood Sugar- 138 (this morning) A1C- 6.5 (this year)   Patient confirms h/o diabetes.  Patient relates 20+ year h/o diabetes.  Patient denies any h/o foot wounds.  Patient denies any numbness, tingling, burning, or pins/needle sensation in feet.  Risk factors: diabetes, h/o MI, HTN, CKD, hyperlipemia, dyslipidemia, h/o tobacco use in remission.  Alexander Pollen, MD is patient's PCP.  Past Medical History:  Diagnosis Date   Diabetes mellitus without complication ([place])    Hyperlipidemia    Hypertension    Patient Active Problem List   Diagnosis Date Noted   Multinodular goiter 08/01/2021   Hypertension associated with type 2 diabetes mellitus ([place]) 12/26/2020   Type 1 diabetes ([place]) 03/13/2020   Diabetic polyneuropathy associated with type 2 diabetes mellitus ([place]) 09/26/2019   Type 2 diabetes mellitus with hyperglycemia ([place]) 09/26/2019   Dyslipidemia associated with type 2 diabetes mellitus ([place]) 09/26/2019   Stage 3a chronic kidney disease ([place]) 09/26/2019   Left renal mass 08/30/2018   Type 2 MI (myocardial infarction) ([place]) 08/30/2018   Morbid obesity ([place]) 08/28/2018   BMI 38.0-38.9,adult 10/21/2011   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 And pen needles 1/day (Patient not taking: Reported on 06/06/2022) 30 mL 1   lisinopril-hydrochlorothiazide (ZESTORETIC) 20-12.5 MG tablet TAKE 2 TABLETS BY MOUTH DAILY 180 tablet 3   simvastatin (ZOCOR) 40 MG tablet TAKE 1 TABLET BY MOUTH EVERY DAY 90 tablet 0   No current facility-administered medications on file prior to visit.    Allergies  Allergen Reactions   Gabapentin Other (See Comments)    Hallucinations    Social History   Occupational History   Not on file  Tobacco Use   Smoking status: Former    Packs/day: 0.25    Years: 2.00    Total pack years: 0.50    Types: Cigarettes    Quit date: 12/28/1973    Years since quitting: 48.5   Smokeless tobacco: Never  Substance and Sexual Activity   Alcohol use: Yes   Drug use: No   Sexual activity: Never   Family History  Problem Relation Age of Onset   Diabetes Mother    Hypertension Sister    Hypertension Brother    Immunization History  Administered Date(s) Administered   Moderna Sars-Covid-2 Vaccination 11/05/2019, 12/05/2019, 07/18/2020     Review of Systems: Negative except as noted in the HPI.   Objective: Vitals:   07/10/22 1144  BP: (!) 159/105  Pulse: Alexander Cochran is a pleasant 66 y.o. male in NAD. AAO X 3.  Vascular Examination: Capillary refill time immediate b/l. Vascular status intact b/l with palpable pedal pulses. No pain with calf compression b/l. Skin temperature gradient WNL b/l. Pedal  hair absent. Trace edema noted BLE.  Neurological Examination: Sensation grossly intact b/l with 10 gram monofilament. Vibratory sensation intact b/l. Protective sensation decreased with 10 gram monofilament b/l. Vibratory sensation intact b/l.  Dermatological Examination: Pedal skin with normal turgor, texture and tone b/l. Toenails 1-5 b/l thick, discolored, elongated with subungual debris and pain on dorsal palpation. No open wounds b/l LE. No interdigital macerations noted b/l LE. Diffuse  scaling noted peripherally and plantarly b/l feet.  No interdigital macerations.  No blisters, no weeping. No signs of secondary bacterial infection noted.  Musculoskeletal Examination: Normal muscle strength 5/5 to all lower extremity muscle groups bilaterally. 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Marland Kitchen No pain, crepitus or joint limitation noted with ROM b/l LE.  Patient ambulates independently without assistive aids.  Radiographs: None  Last A1c:      Latest Ref Rng & Units 03/28/2022   11:10 AM 11/29/2021   10:59 AM 08/01/2021   10:56 AM  Hemoglobin A1C  Hemoglobin-A1c 4.6 - 6.5 % 6.6  6.5  7.0    Footwear Assessment: Does the patient wear appropriate shoes? Yes. Does the patient need inserts/orthotics? No.  ADA Risk Categorization: High Risk  Patient has one or more of the following: Loss of protective sensation Absent pedal pulses Severe Foot deformity History of foot ulcer  Assessment: 1. Pain due to onychomycosis of toenails of both feet   2. Tinea pedis of both feet   3. Hallux valgus, acquired, bilateral   4. Pes planus of both feet   5. Diabetic peripheral neuropathy associated with type 2 diabetes mellitus (Alexander Cochran)   6. Encounter for diabetic foot exam (Alexander Cochran)     Plan: No orders of the defined types were placed in this encounter.  Meds ordered this encounter  Medications   ketoconazole (NIZORAL) 2 % cream    Sig: Apply to both feet and between toes once daily for 6 weeks.    Dispense:  60 g    Refill:  1   -Consent given for treatment as described below: -Examined patient. -Diabetic foot examination performed today. -Patient to continue soft, supportive shoe gear daily. -Toenails 1-5 b/l were debrided in length and girth with sterile nail nippers and dremel without iatrogenic bleeding.  -For tinea pedis, Rx sent to pharmacy for Ketoconazole Cream 2% to be applied once daily for six weeks. -Patient/POA to call should there be  question/concern in the interim. Return in about 3 months (around 10/09/2022).  [name], DPM

## 2022-08-14 ENCOUNTER — Other Ambulatory Visit: Payer: Self-pay | Admitting: Emergency Medicine

## 2022-08-14 DIAGNOSIS — E785 Hyperlipidemia, unspecified: Secondary | ICD-10-CM

## 2022-09-09 ENCOUNTER — Ambulatory Visit (INDEPENDENT_AMBULATORY_CARE_PROVIDER_SITE_OTHER): Payer: Medicare Other | Admitting: Emergency Medicine

## 2022-09-09 ENCOUNTER — Encounter: Payer: Self-pay | Admitting: Emergency Medicine

## 2022-09-09 VITALS — BP 142/76 | HR 85 | Temp 97.8°F | Ht 66.0 in | Wt 245.0 lb

## 2022-09-09 DIAGNOSIS — H9313 Tinnitus, bilateral: Secondary | ICD-10-CM

## 2022-09-09 DIAGNOSIS — Z794 Long term (current) use of insulin: Secondary | ICD-10-CM

## 2022-09-09 DIAGNOSIS — E1165 Type 2 diabetes mellitus with hyperglycemia: Secondary | ICD-10-CM

## 2022-09-09 DIAGNOSIS — E1159 Type 2 diabetes mellitus with other circulatory complications: Secondary | ICD-10-CM

## 2022-09-09 DIAGNOSIS — E785 Hyperlipidemia, unspecified: Secondary | ICD-10-CM | POA: Diagnosis not present

## 2022-09-09 DIAGNOSIS — I152 Hypertension secondary to endocrine disorders: Secondary | ICD-10-CM

## 2022-09-09 DIAGNOSIS — N1831 Chronic kidney disease, stage 3a: Secondary | ICD-10-CM

## 2022-09-09 DIAGNOSIS — E1169 Type 2 diabetes mellitus with other specified complication: Secondary | ICD-10-CM | POA: Diagnosis not present

## 2022-09-09 LAB — POCT GLYCOSYLATED HEMOGLOBIN (HGB A1C): Hemoglobin A1C: 6.9 % — AB (ref 4.0–5.6)

## 2022-09-09 LAB — LIPID PANEL
Cholesterol: 149 mg/dL (ref 0–200)
HDL: 33.1 mg/dL — ABNORMAL LOW (ref >39.00)
NonHDL: 115.47
Total CHOL/HDL Ratio: 4
Triglycerides: 242 mg/dL — ABNORMAL HIGH (ref 0.0–149.0)
VLDL: 48.4 mg/dL — ABNORMAL HIGH (ref 0.0–40.0)

## 2022-09-09 LAB — COMPREHENSIVE METABOLIC PANEL
ALT: 17 U/L (ref 0–53)
AST: 16 U/L (ref 0–37)
Albumin: 4.9 g/dL (ref 3.5–5.2)
Alkaline Phosphatase: 57 U/L (ref 39–117)
BUN: 23 mg/dL (ref 6–23)
CO2: 31 mEq/L (ref 19–32)
Calcium: 10.3 mg/dL (ref 8.4–10.5)
Chloride: 102 mEq/L (ref 96–112)
Creatinine, Ser: 1.84 mg/dL — ABNORMAL HIGH (ref 0.40–1.50)
GFR: 37.95 mL/min — ABNORMAL LOW (ref >60.00)
Glucose, Bld: 146 mg/dL — ABNORMAL HIGH (ref 70–99)
Potassium: 3.5 mEq/L (ref 3.5–5.1)
Sodium: 143 mEq/L (ref 135–145)
Total Bilirubin: 1 mg/dL (ref 0.2–1.2)
Total Protein: 7.7 g/dL (ref 6.0–8.3)

## 2022-09-09 LAB — LDL CHOLESTEROL, DIRECT: Direct LDL: 81 mg/dL

## 2022-09-09 MED ORDER — DAPAGLIFLOZIN PROPANEDIOL 10 MG PO TABS: 10.0000 mg | ORAL_TABLET | Freq: Every day | ORAL | 3 refills | Status: DC

## 2022-09-09 NOTE — Assessment & Plan Note (Signed)
Stable.  Diet and nutrition discussed. Continue simvastatin 40 mg daily.

## 2022-09-09 NOTE — Assessment & Plan Note (Signed)
Clinically stable.  No ear infection. Normal ear exam.  No new medications. Could be related to diabetes and hypertension. Needs ENT evaluation.  Referral placed today.

## 2022-09-09 NOTE — Progress Notes (Signed)
Alexander Cochran 67 y.o.   Chief Complaint  Patient presents with   Ear Problem    Humming in both ears since last 2 weeks in January     HISTORY OF PRESENT ILLNESS: This is a 67 y.o. male complaining of bilateral ear homing for the past 2 weeks Also has history of diabetes and hypertension.  Has not seen me in over a year. Sees endocrinologist on a regular basis.  Was taken off insulin and started on Rybelsus but cannot afford it anymore. Presently on no diabetes medication.  Well-controlled hypertension.  On statin therapy. No other complaint or medical concerns today.  HPI   Prior to Admission medications   Medication Sig Start Date End Date Taking? Authorizing Provider  B-D ULTRAFINE III SHORT PEN 31G X 8 MM MISC USE AS DIRECTED 11/04/21  Yes Kerigan Narvaez, Ines Bloomer, MD  ketoconazole (NIZORAL) 2 % cream Apply to both feet and between toes once daily for 6 weeks. 07/10/22  Yes [name], DPM  lisinopril-hydrochlorothiazide (ZESTORETIC) 20-12.5 MG tablet TAKE 2 TABLETS BY MOUTH DAILY 12/26/21 09/09/22 Yes [name], MD  simvastatin (ZOCOR) 40 MG tablet TAKE 1 TABLET BY MOUTH EVERY DAY 08/14/22  Yes Deshante Cassell, Ines Bloomer, MD  gabapentin (NEURONTIN) 300 MG capsule TAKE 1 CAPSULE BY MOUTH TWICE A DAY Patient not taking: Reported on 06/06/2022 09/22/21   [name], MD  insulin glargine (LANTUS SOLOSTAR) 100 UNIT/ML Solostar Pen Inject 30 Units into the skin every morning. And pen needles 1/day Patient not taking: Reported on 06/06/2022 01/10/22   Elayne Snare, MD    Allergies  Allergen Reactions   Gabapentin Other (See Comments)    Hallucinations     Patient Active Problem List   Diagnosis Date Noted   Multinodular goiter 08/01/2021   Hypertension associated with type 2 diabetes mellitus ([place]) 12/26/2020   Type 1 diabetes ([place]) 03/13/2020   Diabetic polyneuropathy associated with type 2 diabetes mellitus ([place]) 09/26/2019   Type 2 diabetes mellitus with  hyperglycemia ([place]) 09/26/2019   Dyslipidemia associated with type 2 diabetes mellitus ([place]) 09/26/2019   Stage 3a chronic kidney disease ([place]) 09/26/2019   Left renal mass 08/30/2018   Type 2 MI (myocardial infarction) ([place]) 08/30/2018   Morbid obesity ([place]) 08/28/2018   BMI 38.0-38.9,adult 10/21/2011   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   Hyperlipemia 10/21/2011    Past Medical History:  Diagnosis Date   Diabetes mellitus without complication ([place])    Hyperlipidemia    Hypertension     Past Surgical History:  Procedure Laterality Date   CYST EXCISION PERINEAL  1984    Social History   Socioeconomic History   Marital status: Married    Spouse name: Not on file   Number of children: Not on file   Years of education: Not on file   Highest education level: Not on file  Occupational History   Not on file  Tobacco Use   Smoking status: Former    Packs/day: 0.25    Years: 2.00    Total pack years: 0.50    Types: Cigarettes    Quit date: 12/28/1973    Years since quitting: 48.7   Smokeless tobacco: Never  Substance and Sexual Activity   Alcohol use: Yes   Drug use: No   Sexual activity: Never  Other Topics Concern   Not on file  Social History Narrative   Not on file   Social Determinants of Health   Financial Resource Strain: Not  on file  Food Insecurity: Not on file  Transportation Needs: Not on file  Physical Activity: Not on file  Stress: Not on file  Social Connections: Not on file  Intimate Partner Violence: Not on file    Family History  Problem Relation Age of Onset   Diabetes Mother    Hypertension Sister    Hypertension Brother      Review of Systems  Constitutional: Negative.  Negative for chills and fever.  HENT:  Positive for tinnitus. Negative for congestion, ear discharge, ear pain and sore throat.   Eyes: Negative.   Respiratory: Negative.  Negative for cough and shortness of breath.   Cardiovascular: Negative.  Negative for  chest pain and palpitations.  Gastrointestinal: Negative.  Negative for abdominal pain, diarrhea, nausea and vomiting.  Genitourinary: Negative.  Negative for dysuria and hematuria.  Skin: Negative.  Negative for rash.  Neurological: Negative.  Negative for dizziness and headaches.  All other systems reviewed and are negative.  Today's Vitals   09/09/22 0945  BP: (!) 142/76  Pulse: 85  Temp: 97.8 F (36.6 C)  TempSrc: Temporal  SpO2: 99%  Weight: 245 lb (111.1 kg)  Height: 5' 6"$  (1.676 m)   Body mass index is 39.54 kg/m.   Physical Exam Vitals reviewed.  Constitutional:      Appearance: Normal appearance.  HENT:     Head: Normocephalic.     Right Ear: Tympanic membrane, ear canal and external ear normal.     Left Ear: Tympanic membrane, ear canal and external ear normal.     Mouth/Throat:     Mouth: Mucous membranes are moist.     Pharynx: Oropharynx is clear.  Eyes:     Extraocular Movements: Extraocular movements intact.     Conjunctiva/sclera: Conjunctivae normal.     Pupils: Pupils are equal, round, and reactive to light.  Cardiovascular:     Rate and Rhythm: Normal rate and regular rhythm.     Pulses: Normal pulses.     Heart sounds: Normal heart sounds.  Pulmonary:     Effort: Pulmonary effort is normal.     Breath sounds: Normal breath sounds.  Musculoskeletal:     Cervical back: No tenderness.  Lymphadenopathy:     Cervical: No cervical adenopathy.  Skin:    General: Skin is warm and dry.     Capillary Refill: Capillary refill takes less than 2 seconds.  Neurological:     General: No focal deficit present.     Mental Status: He is alert and oriented to person, place, and time.  Psychiatric:        Mood and Affect: Mood normal.        Behavior: Behavior normal.    Results for orders placed or performed in visit on 09/09/22 (from the past 24 hour(s))  POCT HgB A1C     Status: Abnormal   Collection Time: 09/09/22 10:42 AM  Result Value Ref Range    Hemoglobin A1C 6.9 (A) 4.0 - 5.6 %   HbA1c POC (<> result, manual entry)     HbA1c, POC (prediabetic range)     HbA1c, POC (controlled diabetic range)       ASSESSMENT & PLAN: A total of 48 minutes was spent with the patient and counseling/coordination of care regarding preparing for this visit, review of most recent office visit notes, review of most recent endocrinologist office visit notes, review of multiple chronic medical conditions under management, review of all medications and changes made, review  of most recent blood work results including interpretation of today's hemoglobin A1c, education on nutrition, differential diagnosis of tinnitus and need for ENT evaluation, cardiovascular risks associated with diabetes and hypertension, prognosis, documentation, need for follow-up.  Problem List Items Addressed This Visit       Cardiovascular and Mediastinum   Hypertension associated with type 2 diabetes mellitus ([place]) - Primary    Not presently on any medication. Not taking insulin. Recently was switched to Rybelsus but too expensive. Hemoglobin A1c is 6.9 today Recent blood work shows CKD stage IIIb Recommend to start Farxiga 10 mg daily Diet and nutrition discussed Well-controlled hypertension Continue Zestoretic 20-12.5 mg daily Recommend nephrology evaluation. Referral placed today.      Relevant Medications   dapagliflozin propanediol (FARXIGA) 10 MG TABS tablet   Other Relevant Orders   Comprehensive metabolic panel   Ambulatory referral to Nephrology   POCT HgB A1C (Completed)     Endocrine   Type 2 diabetes mellitus with hyperglycemia (H[facility])   Relevant Medications   dapagliflozin propanediol (FARXIGA) 10 MG TABS tablet   Dyslipidemia associated with type 2 diabetes mellitus ([place])    Stable.  Diet and nutrition discussed. Continue simvastatin 40 mg daily.       Relevant Medications   dapagliflozin propanediol (FARXIGA) 10 MG TABS tablet   Other Relevant Orders    Lipid panel     Genitourinary   Stage 3a chronic kidney disease ([place])    Advised to stay well-hydrated and avoid NSAIDs. May benefit from starting Farxiga 10 mg daily.      Relevant Orders   Ambulatory referral to Nephrology     Other   Bilateral tinnitus    Clinically stable.  No ear infection. Normal ear exam.  No new medications. Could be related to diabetes and hypertension. Needs ENT evaluation.  Referral placed today.      Relevant Orders   Ambulatory referral to ENT   Patient Instructions  Diabetes Mellitus and Nutrition, Adult When you have diabetes, or diabetes mellitus, it is very important to have healthy eating habits because your blood sugar (glucose) levels are greatly affected by what you eat and drink. Eating healthy foods in the right amounts, at about the same times every day, can help you: Manage your blood glucose. Lower your risk of heart disease. Improve your blood pressure. Reach or maintain a healthy weight. What can affect my meal plan? Every person with diabetes is different, and each person has different needs for a meal plan. Your health care provider may recommend that you work with a dietitian to make a meal plan that is best for you. Your meal plan may vary depending on factors such as: The calories you need. The medicines you take. Your weight. Your blood glucose, blood pressure, and cholesterol levels. Your activity level. Other health conditions you have, such as heart or kidney disease. How do carbohydrates affect me? Carbohydrates, also called carbs, affect your blood glucose level more than any other type of food. Eating carbs raises the amount of glucose in your blood. It is important to know how many carbs you can safely have in each meal. This is different for every person. Your dietitian can help you calculate how many carbs you should have at each meal and for each snack. How does alcohol affect me? Alcohol can cause a decrease in  blood glucose (hypoglycemia), especially if you use insulin or take certain diabetes medicines by mouth. Hypoglycemia can be a life-threatening condition. Symptoms  of hypoglycemia, such as sleepiness, dizziness, and confusion, are similar to symptoms of having too much alcohol. Do not drink alcohol if: Your health care provider tells you not to drink. You are pregnant, may be pregnant, or are planning to become pregnant. If you drink alcohol: Limit how much you have to: 0-1 drink a day for women. 0-2 drinks a day for men. Know how much alcohol is in your drink. In the [place] one drink equals one 12 oz bottle of beer (355 mL), one 5 oz glass of wine (148 mL), or one 1 oz glass of hard liquor (44 mL). Keep yourself hydrated with water, diet soda, or unsweetened iced tea. Keep in mind that regular soda, juice, and other mixers may contain a lot of sugar and must be counted as carbs. What are tips for following this plan?  Reading food labels Start by checking the serving size on the Nutrition Facts label of packaged foods and drinks. The number of calories and the amount of carbs, fats, and other nutrients listed on the label are based on one serving of the item. Many items contain more than one serving per package. Check the total grams (g) of carbs in one serving. Check the number of grams of saturated fats and trans fats in one serving. Choose foods that have a low amount or none of these fats. Check the number of milligrams (mg) of salt (sodium) in one serving. Most people should limit total sodium intake to less than 2,300 mg per day. Always check the nutrition information of foods labeled as "low-fat" or "nonfat." These foods may be higher in added sugar or refined carbs and should be avoided. Talk to your dietitian to identify your daily goals for nutrients listed on the label. Shopping Avoid buying canned, pre-made, or processed foods. These foods tend to be high in fat, sodium, and added  sugar. Shop around the outside edge of the grocery store. This is where you will most often find fresh fruits and vegetables, bulk grains, fresh meats, and fresh dairy products. Cooking Use low-heat cooking methods, such as baking, instead of high-heat cooking methods, such as deep frying. Cook using healthy oils, such as olive, canola, or sunflower oil. Avoid cooking with butter, cream, or high-fat meats. Meal planning Eat meals and snacks regularly, preferably at the same times every day. Avoid going long periods of time without eating. Eat foods that are high in fiber, such as fresh fruits, vegetables, beans, and whole grains. Eat 4-6 oz (112-168 g) of lean protein each day, such as lean meat, chicken, fish, eggs, or tofu. One ounce (oz) (28 g) of lean protein is equal to: 1 oz (28 g) of meat, chicken, or fish. 1 egg.  cup (62 g) of tofu. Eat some foods each day that contain healthy fats, such as avocado, nuts, seeds, and fish. What foods should I eat? Fruits Berries. Apples. Oranges. Peaches. Apricots. Plums. Grapes. Mangoes. Papayas. Pomegranates. Kiwi. Cherries. Vegetables Leafy greens, including lettuce, spinach, kale, chard, collard greens, mustard greens, and cabbage. Beets. Cauliflower. Broccoli. Carrots. Green beans. Tomatoes. Peppers. Onions. Cucumbers. Brussels sprouts. Grains Whole grains, such as whole-wheat or whole-grain bread, crackers, tortillas, cereal, and pasta. Unsweetened oatmeal. Quinoa. Brown or wild rice. Meats and other proteins Seafood. Poultry without skin. Lean cuts of poultry and beef. Tofu. Nuts. Seeds. Dairy Low-fat or fat-free dairy products such as milk, yogurt, and cheese. The items listed above may not be a complete list of foods and beverages you can  eat and drink. Contact a dietitian for more information. What foods should I avoid? Fruits Fruits canned with syrup. Vegetables Canned vegetables. Frozen vegetables with butter or cream  sauce. Grains Refined white flour and flour products such as bread, pasta, snack foods, and cereals. Avoid all processed foods. Meats and other proteins Fatty cuts of meat. Poultry with skin. Breaded or fried meats. Processed meat. Avoid saturated fats. Dairy Full-fat yogurt, cheese, or milk. Beverages Sweetened drinks, such as soda or iced tea. The items listed above may not be a complete list of foods and beverages you should avoid. Contact a dietitian for more information. Questions to ask a health care provider Do I need to meet with a certified diabetes care and education specialist? Do I need to meet with a dietitian? What number can I call if I have questions? When are the best times to check my blood glucose? Where to find more information: American Diabetes Association: diabetes.org Academy of Nutrition and Dietetics: eatright.[employer] of Diabetes and Digestive and Kidney Diseases: [employer] Association of Diabetes Care & Education Specialists: diabeteseducator.org Summary It is important to have healthy eating habits because your blood sugar (glucose) levels are greatly affected by what you eat and drink. It is important to use alcohol carefully. A healthy meal plan will help you manage your blood glucose and lower your risk of heart disease. Your health care provider may recommend that you work with a dietitian to make a meal plan that is best for you. This information is not intended to replace advice given to you by your health care provider. Make sure you discuss any questions you have with your health care provider. Document Revised: 02/15/2020 Document Reviewed: 02/15/2020 Elsevier Patient Education  [place], MD [facility]

## 2022-09-09 NOTE — Assessment & Plan Note (Signed)
Not presently on any medication. Not taking insulin. Recently was switched to Rybelsus but too expensive. Hemoglobin A1c is 6.9 today Recent blood work shows CKD stage IIIb Recommend to start Farxiga 10 mg daily Diet and nutrition discussed Well-controlled hypertension Continue Zestoretic 20-12.5 mg daily Recommend nephrology evaluation. Referral placed today.

## 2022-09-09 NOTE — Patient Instructions (Signed)

## 2022-09-09 NOTE — Assessment & Plan Note (Signed)
Advised to stay well-hydrated and avoid NSAIDs. May benefit from starting Farxiga 10 mg daily.

## 2022-09-12 ENCOUNTER — Other Ambulatory Visit: Payer: Self-pay | Admitting: Emergency Medicine

## 2022-09-12 DIAGNOSIS — I152 Hypertension secondary to endocrine disorders: Secondary | ICD-10-CM

## 2022-10-22 ENCOUNTER — Telehealth: Payer: Self-pay

## 2022-10-22 NOTE — Telephone Encounter (Signed)
Inbound fax from [employer] requesting clinical notes. Clinical notes routed via Epic to [id number]

## 2022-10-30 ENCOUNTER — Ambulatory Visit (INDEPENDENT_AMBULATORY_CARE_PROVIDER_SITE_OTHER): Payer: Medicare Other | Admitting: Podiatry

## 2022-10-30 VITALS — BP 163/95 | HR 91

## 2022-10-30 DIAGNOSIS — E1142 Type 2 diabetes mellitus with diabetic polyneuropathy: Secondary | ICD-10-CM | POA: Diagnosis not present

## 2022-10-30 DIAGNOSIS — M79674 Pain in right toe(s): Secondary | ICD-10-CM

## 2022-10-30 DIAGNOSIS — M79675 Pain in left toe(s): Secondary | ICD-10-CM

## 2022-10-30 DIAGNOSIS — B351 Tinea unguium: Secondary | ICD-10-CM

## 2022-11-02 ENCOUNTER — Encounter: Payer: Self-pay | Admitting: Podiatry

## 2022-11-02 NOTE — Progress Notes (Signed)
  Subjective:  Patient ID: Alexander Cochran, male    DOB: 1956-05-24,  MRN: [id number] Chief Complaint  Patient presents with   diabetic foot care    67 y.o. male presents at risk foot care with history of diabetic neuropathy and painful thick toenails that are difficult to trim. Pain interferes with ambulation. Aggravating factors include wearing enclosed shoe gear. Pain is relieved with periodic professional debridement.  New problem(s): None   PCP is Georgina Quint, MD. Last visit 09/09/2022.  Allergies  Allergen Reactions   Gabapentin Other (See Comments)    Hallucinations     Review of Systems: Negative except as noted in the HPI.   Objective:  Alexander Cochran is a pleasant 67 y.o. male obese in NAD.Marland Kitchen AAO x 3.  Vascular Examination: Capillary refill time immediate b/l. Vascular status intact b/l with palpable pedal pulses. No pain with calf compression b/l. Skin temperature gradient WNL b/l. Pedal hair absent. Trace edema noted BLE.  Neurological Examination: Sensation grossly intact b/l with 10 gram monofilament. Vibratory sensation intact b/l. Protective sensation decreased with 10 gram monofilament b/l. Vibratory sensation intact b/l.  Dermatological Examination: Pedal skin with normal turgor, texture and tone b/l. Toenails 1-5 b/l thick, discolored, elongated with subungual debris and pain on dorsal palpation. No open wounds b/l LE. No interdigital macerations noted b/l LE.   Resolved diffuse scaling noted peripherally and plantarly b/l feet.  No interdigital macerations.  No blisters, no weeping. No signs of secondary bacterial infection noted.  Musculoskeletal Examination: Normal muscle strength 5/5 to all lower extremity muscle groups bilaterally. 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 No pain, crepitus or joint limitation noted with ROM b/l LE.  Patient ambulates independently without assistive aids.  Radiographs:  None  Last A1c:      Latest Ref Rng & Units 09/09/2022   10:42 AM 03/28/2022   11:10 AM 11/29/2021   10:59 AM  Hemoglobin A1C  Hemoglobin-A1c 4.0 - 5.6 % 6.9  6.6  6.5      Assessment:   1. Pain due to onychomycosis of toenails of both feet   2. Diabetic peripheral neuropathy associated with type 2 diabetes mellitus    Plan:  -Patient was evaluated and treated. All patient's and/or POA's questions/concerns answered on today's visit. -Continue supportive shoe gear daily. -Mycotic toenails 1-5 bilaterally were debrided in length and girth with sterile nail nippers and dremel without incident. -Patient/POA to call should there be question/concern in the interim.  No follow-ups on file.  Freddie Breech, DPM

## 2022-11-14 ENCOUNTER — Other Ambulatory Visit: Payer: Self-pay | Admitting: Emergency Medicine

## 2022-11-14 DIAGNOSIS — E785 Hyperlipidemia, unspecified: Secondary | ICD-10-CM

## 2022-11-25 ENCOUNTER — Other Ambulatory Visit: Payer: Self-pay | Admitting: Emergency Medicine

## 2022-11-25 DIAGNOSIS — I152 Hypertension secondary to endocrine disorders: Secondary | ICD-10-CM

## 2022-12-26 LAB — LAB REPORT - SCANNED
Albumin, Urine POC: 7
Albumin/Creatinine Ratio, Urine, POC: 8
EGFR: 47

## 2023-01-02 ENCOUNTER — Other Ambulatory Visit: Payer: Self-pay | Admitting: Nephrology

## 2023-01-02 DIAGNOSIS — N1832 Chronic kidney disease, stage 3b: Secondary | ICD-10-CM

## 2023-01-08 ENCOUNTER — Ambulatory Visit: Payer: Medicare Other | Admitting: Emergency Medicine

## 2023-02-05 ENCOUNTER — Ambulatory Visit: Payer: Medicare Other | Admitting: Podiatry

## 2023-02-18 ENCOUNTER — Ambulatory Visit: Payer: Medicare Other | Admitting: Podiatry

## 2023-02-18 DIAGNOSIS — M79675 Pain in left toe(s): Secondary | ICD-10-CM

## 2023-02-18 DIAGNOSIS — M79674 Pain in right toe(s): Secondary | ICD-10-CM

## 2023-02-18 DIAGNOSIS — B351 Tinea unguium: Secondary | ICD-10-CM | POA: Diagnosis not present

## 2023-02-18 NOTE — Progress Notes (Signed)
    Subjective:  Patient ID: Alexander Cochran, male    DOB: Sep 24, 1955,  MRN: [id number]  Alexander Cochran presents to clinic today for:  Chief Complaint  Patient presents with   Nail Problem    [facility]  . Patient notes nails are thick, discolored, elongated and painful in shoegear when trying to ambulate.    PCP is Sagardia, Eilleen Kempf, MD.  Allergies  Allergen Reactions   Gabapentin Other (See Comments)    Hallucinations    Review of Systems: Negative except as noted in the HPI.  Objective:  There were no vitals filed for this visit.  Alexander Cochran is a pleasant 67 y.o. male in NAD. AAO x 3.  Vascular Examination: Capillary refill time is 3-5 seconds to toes bilateral. Palpable pedal pulses b/l LE. Digital hair present b/l. No pedal edema b/l. Skin temperature gradient WNL b/l. No varicosities b/l. No cyanosis or clubbing noted b/l.   Dermatological Examination: Pedal skin with normal turgor, texture and tone b/l. No open wounds. No interdigital macerations b/l. Toenails x10 are 3mm thick, discolored, dystrophic with subungual debris. There is pain with compression of the nail plates.  They are elongated x10     Latest Ref Rng & Units 09/09/2022   10:42 AM 03/28/2022   11:10 AM  Hemoglobin A1C  Hemoglobin-A1c 4.0 - 5.6 % 6.9  6.6    Assessment/Plan: 1. Pain due to onychomycosis of toenails of both feet     The mycotic toenails were sharply debrided x10 with sterile nail nippers and a power debriding burr to decrease bulk/thickness and length.    Return in about 3 months (around 05/21/2023) for [facility].   Alexander Cochran, DPM, FACFAS [facility]     [address] [place], [place] [address]                Office [phone]  Fax [phone]

## 2023-05-20 ENCOUNTER — Encounter: Payer: Self-pay | Admitting: Podiatry

## 2023-05-20 ENCOUNTER — Ambulatory Visit (INDEPENDENT_AMBULATORY_CARE_PROVIDER_SITE_OTHER): Payer: Medicare Other | Admitting: Podiatry

## 2023-05-20 DIAGNOSIS — B351 Tinea unguium: Secondary | ICD-10-CM

## 2023-05-20 DIAGNOSIS — M79675 Pain in left toe(s): Secondary | ICD-10-CM

## 2023-05-20 DIAGNOSIS — M79674 Pain in right toe(s): Secondary | ICD-10-CM

## 2023-05-20 NOTE — Progress Notes (Signed)
Subjective:  Patient ID: CHEMAR MERRITHEW, male    DOB: 04-Feb-1956,  MRN: [id number]   [name] presents to clinic today for:  Chief Complaint  Patient presents with   [facility]    Nail Trim. T2DM. Unsure of last A1c  . Patient notes nails are thick, discolored, elongated and painful in shoegear when trying to ambulate.    PCP is Sagardia, Eilleen Kempf, MD.  Past Medical History:  Diagnosis Date   Diabetes mellitus without complication (HCC)    Hyperlipidemia    Hypertension     Past Surgical History:  Procedure Laterality Date   CYST EXCISION PERINEAL  1984    Allergies  Allergen Reactions   Gabapentin Other (See Comments)    Hallucinations    Review of Systems: Negative except as noted in the HPI.  Objective:  MOHSEN HELMER is a pleasant 67 y.o. male in NAD. AAO x 3.  Vascular Examination: Capillary refill time is 3-5 seconds to toes bilateral. Palpable pedal pulses b/l LE. Digital hair present b/l.  Skin temperature gradient WNL b/l. No varicosities b/l. No cyanosis noted b/l.   Dermatological Examination: Pedal skin with normal turgor, texture and tone b/l. No open wounds. No interdigital macerations b/l. Toenails x10 are 3mm thick, discolored, dystrophic with subungual debris. There is pain with compression of the nail plates.  They are elongated x10     Latest Ref Rng & Units 09/09/2022   10:42 AM  Hemoglobin A1C  Hemoglobin-A1c 4.0 - 5.6 % 6.9     Assessment/Plan: 1. Pain due to onychomycosis of toenails of both feet    The mycotic toenails were sharply debrided x10 with sterile nail nippers and a power debriding burr to decrease bulk/thickness and length.    States he'll call when he needs a refill on his ketoconazole cream.  Return in about 3 months (around 08/20/2023) for [facility].   [name], DPM, FACFAS [facility]     [address] [place], [place] [address]                Office  [phone]  Fax [phone]

## 2023-06-11 LAB — LAB REPORT - SCANNED
Albumin, Urine POC: 6.5
Albumin/Creatinine Ratio, Urine, POC: 8

## 2023-06-16 ENCOUNTER — Encounter: Payer: Self-pay | Admitting: Emergency Medicine

## 2023-06-16 ENCOUNTER — Ambulatory Visit (INDEPENDENT_AMBULATORY_CARE_PROVIDER_SITE_OTHER): Payer: Medicare Other | Admitting: Emergency Medicine

## 2023-06-16 VITALS — BP 140/88 | HR 61 | Temp 98.2°F | Ht 66.0 in | Wt 244.2 lb

## 2023-06-16 DIAGNOSIS — N1831 Chronic kidney disease, stage 3a: Secondary | ICD-10-CM | POA: Diagnosis not present

## 2023-06-16 DIAGNOSIS — E1159 Type 2 diabetes mellitus with other circulatory complications: Secondary | ICD-10-CM | POA: Diagnosis not present

## 2023-06-16 DIAGNOSIS — E1169 Type 2 diabetes mellitus with other specified complication: Secondary | ICD-10-CM | POA: Diagnosis not present

## 2023-06-16 DIAGNOSIS — Z7984 Long term (current) use of oral hypoglycemic drugs: Secondary | ICD-10-CM

## 2023-06-16 DIAGNOSIS — I152 Hypertension secondary to endocrine disorders: Secondary | ICD-10-CM

## 2023-06-16 DIAGNOSIS — E785 Hyperlipidemia, unspecified: Secondary | ICD-10-CM

## 2023-06-16 LAB — POCT GLYCOSYLATED HEMOGLOBIN (HGB A1C): Hemoglobin A1C: 6.3 % — AB (ref 4.0–5.6)

## 2023-06-16 MED ORDER — VALSARTAN 160 MG PO TABS: 160.0000 mg | ORAL_TABLET | Freq: Every day | ORAL | 3 refills | Status: DC

## 2023-06-16 MED ORDER — DAPAGLIFLOZIN PROPANEDIOL 10 MG PO TABS: 10.0000 mg | ORAL_TABLET | Freq: Every day | ORAL | 3 refills | Status: DC

## 2023-06-16 NOTE — Patient Instructions (Signed)
Hypertension, Adult High blood pressure (hypertension) is when the force of blood pumping through the arteries is too strong. The arteries are the blood vessels that carry blood from the heart throughout the body. Hypertension forces the heart to work harder to pump blood and may cause arteries to become narrow or stiff. Untreated or uncontrolled hypertension can lead to a heart attack, heart failure, a stroke, kidney disease, and other problems. A blood pressure reading consists of a higher number over a lower number. Ideally, your blood pressure should be below 120/80. The first ("top") number is called the systolic pressure. It is a measure of the pressure in your arteries as your heart beats. The second ("bottom") number is called the diastolic pressure. It is a measure of the pressure in your arteries as the heart relaxes. What are the causes? The exact cause of this condition is not known. There are some conditions that result in high blood pressure. What increases the risk? Certain factors may make you more likely to develop high blood pressure. Some of these risk factors are under your control, including: Smoking. Not getting enough exercise or physical activity. Being overweight. Having too much fat, sugar, calories, or salt (sodium) in your diet. Drinking too much alcohol. Other risk factors include: Having a personal history of heart disease, diabetes, high cholesterol, or kidney disease. Stress. Having a family history of high blood pressure and high cholesterol. Having obstructive sleep apnea. Age. The risk increases with age. What are the signs or symptoms? High blood pressure may not cause symptoms. Very high blood pressure (hypertensive crisis) may cause: Headache. Fast or irregular heartbeats (palpitations). Shortness of breath. Nosebleed. Nausea and vomiting. Vision changes. Severe chest pain, dizziness, and seizures. How is this diagnosed? This condition is diagnosed by  measuring your blood pressure while you are seated, with your arm resting on a flat surface, your legs uncrossed, and your feet flat on the floor. The cuff of the blood pressure monitor will be placed directly against the skin of your upper arm at the level of your heart. Blood pressure should be measured at least twice using the same arm. Certain conditions can cause a difference in blood pressure between your right and left arms. If you have a high blood pressure reading during one visit or you have normal blood pressure with other risk factors, you may be asked to: Return on a different day to have your blood pressure checked again. Monitor your blood pressure at home for 1 week or longer. If you are diagnosed with hypertension, you may have other blood or imaging tests to help your health care provider understand your overall risk for other conditions. How is this treated? This condition is treated by making healthy lifestyle changes, such as eating healthy foods, exercising more, and reducing your alcohol intake. You may be referred for counseling on a healthy diet and physical activity. Your health care provider may prescribe medicine if lifestyle changes are not enough to get your blood pressure under control and if: Your systolic blood pressure is above 130. Your diastolic blood pressure is above 80. Your personal target blood pressure may vary depending on your medical conditions, your age, and other factors. Follow these instructions at home: Eating and drinking  Eat a diet that is high in fiber and potassium, and low in sodium, added sugar, and fat. An example of this eating plan is called the DASH diet. DASH stands for Dietary Approaches to Stop Hypertension. To eat this way: Eat   plenty of fresh fruits and vegetables. Try to fill one half of your plate at each meal with fruits and vegetables. Eat whole grains, such as whole-wheat pasta, brown rice, or whole-grain bread. Fill about one  fourth of your plate with whole grains. Eat or drink low-fat dairy products, such as skim milk or low-fat yogurt. Avoid fatty cuts of meat, processed or cured meats, and poultry with skin. Fill about one fourth of your plate with lean proteins, such as fish, chicken without skin, beans, eggs, or tofu. Avoid pre-made and processed foods. These tend to be higher in sodium, added sugar, and fat. Reduce your daily sodium intake. Many people with hypertension should eat less than 1,500 mg of sodium a day. Do not drink alcohol if: Your health care provider tells you not to drink. You are pregnant, may be pregnant, or are planning to become pregnant. If you drink alcohol: Limit how much you have to: 0-1 drink a day for women. 0-2 drinks a day for men. Know how much alcohol is in your drink. In the [place] one drink equals one 12 oz bottle of beer (355 mL), one 5 oz glass of wine (148 mL), or one 1 oz glass of hard liquor (44 mL). Lifestyle  Work with your health care provider to maintain a healthy body weight or to lose weight. Ask what an ideal weight is for you. Get at least 30 minutes of exercise that causes your heart to beat faster (aerobic exercise) most days of the week. Activities may include walking, swimming, or biking. Include exercise to strengthen your muscles (resistance exercise), such as Pilates or lifting weights, as part of your weekly exercise routine. Try to do these types of exercises for 30 minutes at least 3 days a week. Do not use any products that contain nicotine or tobacco. These products include cigarettes, chewing tobacco, and vaping devices, such as e-cigarettes. If you need help quitting, ask your health care provider. Monitor your blood pressure at home as told by your health care provider. Keep all follow-up visits. This is important. Medicines Take over-the-counter and prescription medicines only as told by your health care provider. Follow directions carefully. Blood  pressure medicines must be taken as prescribed. Do not skip doses of blood pressure medicine. Doing this puts you at risk for problems and can make the medicine less effective. Ask your health care provider about side effects or reactions to medicines that you should watch for. Contact a health care provider if you: Think you are having a reaction to a medicine you are taking. Have headaches that keep coming back (recurring). Feel dizzy. Have swelling in your ankles. Have trouble with your vision. Get help right away if you: Develop a severe headache or confusion. Have unusual weakness or numbness. Feel faint. Have severe pain in your chest or abdomen. Vomit repeatedly. Have trouble breathing. These symptoms may be an emergency. Get help right away. Call 911. Do not wait to see if the symptoms will go away. Do not drive yourself to the hospital. Summary Hypertension is when the force of blood pumping through your arteries is too strong. If this condition is not controlled, it may put you at risk for serious complications. Your personal target blood pressure may vary depending on your medical conditions, your age, and other factors. For most people, a normal blood pressure is less than 120/80. Hypertension is treated with lifestyle changes, medicines, or a combination of both. Lifestyle changes include losing weight, eating a healthy,   low-sodium diet, exercising more, and limiting alcohol. This information is not intended to replace advice given to you by your health care provider. Make sure you discuss any questions you have with your health care provider. Document Revised: 05/21/2021 Document Reviewed: 05/21/2021 Elsevier Patient Education  2024 [employer]  

## 2023-06-16 NOTE — Progress Notes (Signed)
Alexander Cochran 67 y.o.   Chief Complaint  Patient presents with   Follow-up    6 month f/u . No other concerns     HISTORY OF PRESENT ILLNESS: This is a 67 y.o. male here for 46-month follow-up of chronic medical conditions including hypertension and diabetes. On lisinopril HCTZ.  Has developed hypercalcemia Presently on no diabetes medications. History of chronic kidney disease.  Was able to follow-up with nephrologist recently No other complaints or medical concerns today.  HPI   Prior to Admission medications   Medication Sig Start Date End Date Taking? Authorizing Provider  B-D ULTRAFINE III SHORT PEN 31G X 8 MM MISC USE AS DIRECTED 11/04/21  Yes Nattalie Santiesteban, Eilleen Kempf, MD  empagliflozin (JARDIANCE) 25 MG TABS tablet Take 1 tablet (25 mg total) by mouth daily before breakfast. 09/13/22  Yes Shanyn Preisler, Eilleen Kempf, MD  ketoconazole (NIZORAL) 2 % cream Apply to both feet and between toes once daily for 6 weeks. 07/10/22  Yes Freddie Breech, DPM  lisinopril-hydrochlorothiazide (ZESTORETIC) 20-12.5 MG tablet TAKE 2 TABLETS BY MOUTH DAILY 11/25/22 11/20/23 Yes Georgina Quint, MD  simvastatin (ZOCOR) 40 MG tablet TAKE 1 TABLET BY MOUTH EVERY DAY 11/14/22  Yes Jesslyn Viglione, Eilleen Kempf, MD  gabapentin (NEURONTIN) 300 MG capsule TAKE 1 CAPSULE BY MOUTH TWICE A DAY Patient not taking: Reported on 06/06/2022 09/22/21   Georgina Quint, MD  insulin glargine (LANTUS SOLOSTAR) 100 UNIT/ML Solostar Pen Inject 30 Units into the skin every morning. And pen needles 1/day Patient not taking: Reported on 06/06/2022 01/10/22   Reather Littler, MD    Allergies  Allergen Reactions   Gabapentin Other (See Comments)    Hallucinations     Patient Active Problem List   Diagnosis Date Noted   Bilateral tinnitus 09/09/2022   Multinodular goiter 08/01/2021   Hypertension associated with type 2 diabetes mellitus (HCC) 12/26/2020   Type 1 diabetes (HCC) 03/13/2020   Diabetic polyneuropathy  associated with type 2 diabetes mellitus (HCC) 09/26/2019   Type 2 diabetes mellitus with hyperglycemia (HCC) 09/26/2019   Dyslipidemia associated with type 2 diabetes mellitus (HCC) 09/26/2019   Stage 3a chronic kidney disease (HCC) 09/26/2019   Left renal mass 08/30/2018   Type 2 MI (myocardial infarction) (HCC) 08/30/2018   Morbid obesity (HCC) 08/28/2018   BMI 38.0-38.9,adult 10/21/2011   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   Hyperlipemia 10/21/2011    Past Medical History:  Diagnosis Date   Diabetes mellitus without complication (HCC)    Hyperlipidemia    Hypertension     Past Surgical History:  Procedure Laterality Date   CYST EXCISION PERINEAL  1984    Social History   Socioeconomic History   Marital status: Married    Spouse name: Not on file   Number of children: Not on file   Years of education: Not on file   Highest education level: Not on file  Occupational History   Not on file  Tobacco Use   Smoking status: Former    Current packs/day: 0.00    Average packs/day: 0.3 packs/day for 2.0 years (0.5 ttl pk-yrs)    Types: Cigarettes    Start date: 12/29/1971    Quit date: 12/28/1973    Years since quitting: 49.4   Smokeless tobacco: Never  Substance and Sexual Activity   Alcohol use: Yes   Drug use: No   Sexual activity: Never  Other Topics Concern   Not on file  Social History Narrative   Not on  file   Social Determinants of Health   Financial Resource Strain: Low Risk  (08/28/2018)   Received from [facility], [facility]   Overall Financial Resource Strain (CARDIA)    Difficulty of Paying Living Expenses: Not very hard  Food Insecurity: No Food Insecurity (08/28/2018)   Received from [facility], [facility]   Hunger Vital Sign    Worried About Running Out of Food in the Last Year: Never true    Ran Out of Food in the Last Year: Never true  Transportation Needs: No Transportation Needs (08/28/2018)   Received from [facility],  [facility]   [facility] - Transportation    Lack of Transportation (Medical): No    Lack of Transportation (Non-Medical): No  Physical Activity: Inactive (08/28/2018)   Received from [facility], [facility]   Exercise Vital Sign    Days of Exercise per Week: 0 days    Minutes of Exercise per Session: 0 min  Stress: No Stress Concern Present (08/28/2018)   Received from [facility], [facility] - Occupational Stress Questionnaire    Feeling of Stress : Only a little  Social Connections: Unknown (08/28/2018)   [facility] from [facility]   Social Connection and Isolation Panel [NHANES]    Frequency of Communication with Friends and Family: More than three times a week    Frequency of Social Gatherings with Friends and Family: More than three times a week    Attends Religious Services: Not on file    Active Member of Clubs or Organizations: Not on file    Attends Banker Meetings: Not on file    Marital Status: Not on file  Intimate Partner Violence: Not At Risk (08/28/2018)   Received from [facility], [facility]   Humiliation, Afraid, Rape, and Kick questionnaire    Fear of Current or Ex-Partner: No    Emotionally Abused: No    Physically Abused: No    Sexually Abused: No    Family History  Problem Relation Age of Onset   Diabetes Mother    Hypertension Sister    Hypertension Brother      Review of Systems  Constitutional: Negative.  Negative for chills and fever.  HENT: Negative.  Negative for congestion and sore throat.   Respiratory: Negative.  Negative for cough and shortness of breath.   Cardiovascular: Negative.  Negative for chest pain and palpitations.  Gastrointestinal:  Negative for abdominal pain, diarrhea, nausea and vomiting.  Genitourinary: Negative.  Negative for dysuria and hematuria.  Skin: Negative.  Negative for rash.  Neurological: Negative.  Negative for  dizziness and headaches.  All other systems reviewed and are negative.   Vitals:   06/16/23 0934  BP: (!) 140/88  Pulse: 61  Temp: 98.2 F (36.8 C)  SpO2: 94%    Physical Exam Vitals reviewed.  Constitutional:      Appearance: Normal appearance.  HENT:     Head: Normocephalic.     Mouth/Throat:     Mouth: Mucous membranes are moist.     Pharynx: Oropharynx is clear.  Eyes:     Extraocular Movements: Extraocular movements intact.  Cardiovascular:     Rate and Rhythm: Normal rate and regular rhythm.     Pulses: Normal pulses.     Heart sounds: Normal heart sounds.  Pulmonary:     Effort: Pulmonary effort  is normal.     Breath sounds: Normal breath sounds.  Musculoskeletal:     Cervical back: No tenderness.  Lymphadenopathy:     Cervical: No cervical adenopathy.  Skin:    General: Skin is warm and dry.     Capillary Refill: Capillary refill takes less than 2 seconds.  Neurological:     General: No focal deficit present.     Mental Status: He is alert and oriented to person, place, and time.    Results for orders placed or performed in visit on 06/16/23 (from the past 24 hour(s))  POCT HgB A1C     Status: Abnormal   Collection Time: 06/16/23  9:57 AM  Result Value Ref Range   Hemoglobin A1C 6.3 (A) 4.0 - 5.6 %   HbA1c POC (<> result, manual entry)     HbA1c, POC (prediabetic range)     HbA1c, POC (controlled diabetic range)       ASSESSMENT & PLAN: A total of 44 minutes was spent with the patient and counseling/coordination of care regarding preparing for this visit, review of most recent office visit notes, review of multiple chronic medical conditions under management, review of most recent blood work results including interpretation of today's hemoglobin A1c, review of all medications and changes made, cardiovascular risks associated with hypertension and diabetes, education on nutrition, review of health maintenance items, prognosis, documentation, and need for  follow-up.  Problem List Items Addressed This Visit       Cardiovascular and Mediastinum   Hypertension associated with type 2 diabetes mellitus (HCC) - Primary    Elevated blood pressure reading in the office History of recurrent hypercalcemia Recommend to stop hydrochlorothiazide We will change lisinopril HCTZ to valsartan 160 mg daily Well-controlled diabetes with hemoglobin A1c of 6.3.  Presently on no medications. Patient with history of CKD.  Recommend to start Farxiga 10 mg daily Cardiovascular risks associated with hypertension and diabetes discussed Diet and nutrition discussed Follow-up in 6 months      Relevant Medications   dapagliflozin propanediol (FARXIGA) 10 MG TABS tablet   valsartan (DIOVAN) 160 MG tablet   Other Relevant Orders   POCT HgB A1C (Completed)     Endocrine   Dyslipidemia associated with type 2 diabetes mellitus (HCC)    Diet and nutrition discussed Continue simvastatin 40 mg daily Well-controlled diabetes with hemoglobin A1c of 6.3 Cardiovascular risks associated with dyslipidemia and diabetes discussed      Relevant Medications   dapagliflozin propanediol (FARXIGA) 10 MG TABS tablet   valsartan (DIOVAN) 160 MG tablet     Genitourinary   Stage 3a chronic kidney disease (HCC)    Recently evaluated by nephrologist Found to have hypercalcemia Recommend to stop hydrochlorothiazide Recommend to start valsartan and Farxiga 10 mg daily Advised to stay well-hydrated and avoid NSAIDs      Relevant Medications   dapagliflozin propanediol (FARXIGA) 10 MG TABS tablet     Other   Morbid obesity (HCC)    Wt Readings from Last 3 Encounters:  06/16/23 244 lb 3.2 oz (110.8 kg)  09/09/22 245 lb (111.1 kg)  06/06/22 251 lb (113.9 kg)  Eating better. Diet and nutrition discussed       Relevant Medications   dapagliflozin propanediol (FARXIGA) 10 MG TABS tablet   Patient Instructions  Hypertension, Adult High blood pressure (hypertension) is  when the force of blood pumping through the arteries is too strong. The arteries are the blood vessels that carry blood from the heart throughout  the body. Hypertension forces the heart to work harder to pump blood and may cause arteries to become narrow or stiff. Untreated or uncontrolled hypertension can lead to a heart attack, heart failure, a stroke, kidney disease, and other problems. A blood pressure reading consists of a higher number over a lower number. Ideally, your blood pressure should be below 120/80. The first ("top") number is called the systolic pressure. It is a measure of the pressure in your arteries as your heart beats. The second ("bottom") number is called the diastolic pressure. It is a measure of the pressure in your arteries as the heart relaxes. What are the causes? The exact cause of this condition is not known. There are some conditions that result in high blood pressure. What increases the risk? Certain factors may make you more likely to develop high blood pressure. Some of these risk factors are under your control, including: Smoking. Not getting enough exercise or physical activity. Being overweight. Having too much fat, sugar, calories, or salt (sodium) in your diet. Drinking too much alcohol. Other risk factors include: Having a personal history of heart disease, diabetes, high cholesterol, or kidney disease. Stress. Having a family history of high blood pressure and high cholesterol. Having obstructive sleep apnea. Age. The risk increases with age. What are the signs or symptoms? High blood pressure may not cause symptoms. Very high blood pressure (hypertensive crisis) may cause: Headache. Fast or irregular heartbeats (palpitations). Shortness of breath. Nosebleed. Nausea and vomiting. Vision changes. Severe chest pain, dizziness, and seizures. How is this diagnosed? This condition is diagnosed by measuring your blood pressure while you are seated, with  your arm resting on a flat surface, your legs uncrossed, and your feet flat on the floor. The cuff of the blood pressure monitor will be placed directly against the skin of your upper arm at the level of your heart. Blood pressure should be measured at least twice using the same arm. Certain conditions can cause a difference in blood pressure between your right and left arms. If you have a high blood pressure reading during one visit or you have normal blood pressure with other risk factors, you may be asked to: Return on a different day to have your blood pressure checked again. Monitor your blood pressure at home for 1 week or longer. If you are diagnosed with hypertension, you may have other blood or imaging tests to help your health care provider understand your overall risk for other conditions. How is this treated? This condition is treated by making healthy lifestyle changes, such as eating healthy foods, exercising more, and reducing your alcohol intake. You may be referred for counseling on a healthy diet and physical activity. Your health care provider may prescribe medicine if lifestyle changes are not enough to get your blood pressure under control and if: Your systolic blood pressure is above 130. Your diastolic blood pressure is above 80. Your personal target blood pressure may vary depending on your medical conditions, your age, and other factors. Follow these instructions at home: Eating and drinking  Eat a diet that is high in fiber and potassium, and low in sodium, added sugar, and fat. An example of this eating plan is called the DASH diet. DASH stands for Dietary Approaches to Stop Hypertension. To eat this way: Eat plenty of fresh fruits and vegetables. Try to fill one half of your plate at each meal with fruits and vegetables. Eat whole grains, such as whole-wheat pasta, brown rice, or whole-grain  bread. Fill about one fourth of your plate with whole grains. Eat or drink  low-fat dairy products, such as skim milk or low-fat yogurt. Avoid fatty cuts of meat, processed or cured meats, and poultry with skin. Fill about one fourth of your plate with lean proteins, such as fish, chicken without skin, beans, eggs, or tofu. Avoid pre-made and processed foods. These tend to be higher in sodium, added sugar, and fat. Reduce your daily sodium intake. Many people with hypertension should eat less than 1,500 mg of sodium a day. Do not drink alcohol if: Your health care provider tells you not to drink. You are pregnant, may be pregnant, or are planning to become pregnant. If you drink alcohol: Limit how much you have to: 0-1 drink a day for women. 0-2 drinks a day for men. Know how much alcohol is in your drink. In the [place] one drink equals one 12 oz bottle of beer (355 mL), one 5 oz glass of wine (148 mL), or one 1 oz glass of hard liquor (44 mL). Lifestyle  Work with your health care provider to maintain a healthy body weight or to lose weight. Ask what an ideal weight is for you. Get at least 30 minutes of exercise that causes your heart to beat faster (aerobic exercise) most days of the week. Activities may include walking, swimming, or biking. Include exercise to strengthen your muscles (resistance exercise), such as Pilates or lifting weights, as part of your weekly exercise routine. Try to do these types of exercises for 30 minutes at least 3 days a week. Do not use any products that contain nicotine or tobacco. These products include cigarettes, chewing tobacco, and vaping devices, such as e-cigarettes. If you need help quitting, ask your health care provider. Monitor your blood pressure at home as told by your health care provider. Keep all follow-up visits. This is important. Medicines Take over-the-counter and prescription medicines only as told by your health care provider. Follow directions carefully. Blood pressure medicines must be taken as prescribed. Do  not skip doses of blood pressure medicine. Doing this puts you at risk for problems and can make the medicine less effective. Ask your health care provider about side effects or reactions to medicines that you should watch for. Contact a health care provider if you: Think you are having a reaction to a medicine you are taking. Have headaches that keep coming back (recurring). Feel dizzy. Have swelling in your ankles. Have trouble with your vision. Get help right away if you: Develop a severe headache or confusion. Have unusual weakness or numbness. Feel faint. Have severe pain in your chest or abdomen. Vomit repeatedly. Have trouble breathing. These symptoms may be an emergency. Get help right away. Call 911. Do not wait to see if the symptoms will go away. Do not drive yourself to the hospital. Summary Hypertension is when the force of blood pumping through your arteries is too strong. If this condition is not controlled, it may put you at risk for serious complications. Your personal target blood pressure may vary depending on your medical conditions, your age, and other factors. For most people, a normal blood pressure is less than 120/80. Hypertension is treated with lifestyle changes, medicines, or a combination of both. Lifestyle changes include losing weight, eating a healthy, low-sodium diet, exercising more, and limiting alcohol. This information is not intended to replace advice given to you by your health care provider. Make sure you discuss any questions you have with  your health care provider. Document Revised: 05/21/2021 Document Reviewed: 05/21/2021 Elsevier Patient Education  2024 Elsevier Inc.     Edwina Barth, MD [facility]

## 2023-06-16 NOTE — Assessment & Plan Note (Signed)
Elevated blood pressure reading in the office History of recurrent hypercalcemia Recommend to stop hydrochlorothiazide We will change lisinopril HCTZ to valsartan 160 mg daily Well-controlled diabetes with hemoglobin A1c of 6.3.  Presently on no medications. Patient with history of CKD.  Recommend to start Farxiga 10 mg daily Cardiovascular risks associated with hypertension and diabetes discussed Diet and nutrition discussed Follow-up in 6 months

## 2023-06-16 NOTE — Assessment & Plan Note (Signed)
Diet and nutrition discussed Continue simvastatin 40 mg daily Well-controlled diabetes with hemoglobin A1c of 6.3 Cardiovascular risks associated with dyslipidemia and diabetes discussed

## 2023-06-16 NOTE — Assessment & Plan Note (Signed)
Wt Readings from Last 3 Encounters:  06/16/23 244 lb 3.2 oz (110.8 kg)  09/09/22 245 lb (111.1 kg)  06/06/22 251 lb (113.9 kg)  Eating better. Diet and nutrition discussed

## 2023-06-16 NOTE — Assessment & Plan Note (Signed)
Recently evaluated by nephrologist Found to have hypercalcemia Recommend to stop hydrochlorothiazide Recommend to start valsartan and Farxiga 10 mg daily Advised to stay well-hydrated and avoid NSAIDs

## 2023-06-18 ENCOUNTER — Other Ambulatory Visit: Payer: Self-pay | Admitting: Nephrology

## 2023-06-18 DIAGNOSIS — N1832 Chronic kidney disease, stage 3b: Secondary | ICD-10-CM

## 2023-06-19 ENCOUNTER — Ambulatory Visit
Admission: RE | Admit: 2023-06-19 | Discharge: 2023-06-19 | Disposition: A | Discharge status: Home / Self Care | Payer: Medicare Other | Source: Ambulatory Visit | Attending: Nephrology

## 2023-06-19 DIAGNOSIS — N1832 Chronic kidney disease, stage 3b: Secondary | ICD-10-CM

## 2023-08-20 ENCOUNTER — Ambulatory Visit (INDEPENDENT_AMBULATORY_CARE_PROVIDER_SITE_OTHER): Payer: Medicare Other | Admitting: Podiatry

## 2023-08-20 DIAGNOSIS — M79674 Pain in right toe(s): Secondary | ICD-10-CM

## 2023-08-20 DIAGNOSIS — B351 Tinea unguium: Secondary | ICD-10-CM | POA: Diagnosis not present

## 2023-08-20 DIAGNOSIS — B353 Tinea pedis: Secondary | ICD-10-CM | POA: Diagnosis not present

## 2023-08-20 DIAGNOSIS — M79675 Pain in left toe(s): Secondary | ICD-10-CM | POA: Diagnosis not present

## 2023-08-20 MED ORDER — KETOCONAZOLE 2 % EX CREA: TOPICAL_CREAM | CUTANEOUS | 3 refills | Status: AC

## 2023-08-20 NOTE — Progress Notes (Signed)
       Subjective:  Patient ID: Alexander Cochran, male    DOB: 18-Mar-1956,  MRN: [id number]   Alexander Cochran presents to clinic today for:  Chief Complaint  Patient presents with   [facility]    [facility] last A1c 6.3 and he seen his PCP on 06/16/2023, No anti coags.    Patient notes nails are thick, discolored, elongated and painful in shoegear when trying to ambulate.  Patient is requesting a refill on his ketoconazole cream.  PCP is Sagardia, Eilleen Kempf, MD.  Past Medical History:  Diagnosis Date   Diabetes mellitus without complication (HCC)    Hyperlipidemia    Hypertension     Past Surgical History:  Procedure Laterality Date   CYST EXCISION PERINEAL  1984    Allergies  Allergen Reactions   Gabapentin Other (See Comments)    Hallucinations     Review of Systems: Negative except as noted in the HPI.  Objective:  Alexander Cochran is a pleasant 68 y.o. male in NAD. AAO x 3.  Vascular Examination: Capillary refill time is 3-5 seconds to toes bilateral. Palpable pedal pulses b/l LE. Digital hair present b/l.  Skin temperature gradient WNL b/l. No varicosities b/l. No cyanosis noted b/l.   Dermatological Examination: Pedal skin with normal turgor, texture and tone b/l. No open wounds. No interdigital macerations b/l. Toenails x10 are 3mm thick, discolored, dystrophic with subungual debris. There is pain with compression of the nail plates.  They are elongated x10.  Skin on plantar aspect of both feet is mildly dry and scaly.     Latest Ref Rng & Units 06/16/2023    9:57 AM 09/09/2022   10:42 AM  Hemoglobin A1C  Hemoglobin-A1c 4.0 - 5.6 % 6.3  6.9    Assessment/Plan: 1. Pain due to onychomycosis of toenails of both feet   2. Tinea pedis of both feet     Meds ordered this encounter  Medications   ketoconazole (NIZORAL) 2 % cream    Sig: Apply 1gm to both feet and between toes once daily    Dispense:  60 g    Refill:  3   The mycotic toenails were sharply debrided x10 with  sterile nail nippers and a power debriding burr to decrease bulk/thickness and length.    The prescription for the ketoconazole 2% cream was sent to his pharmacy.  He will apply this to the plantar aspect of both feet and between the toes once daily.  He was given 3 refills.  Return in about 3 months (around 11/18/2023) for [facility].   [name], DPM, FACFAS [facility]     [address] [place], [place] [address]                Office [phone]  Fax [phone]

## 2023-08-25 ENCOUNTER — Encounter: Payer: Self-pay | Admitting: Emergency Medicine

## 2023-08-25 LAB — HM DIABETES EYE EXAM

## 2023-09-03 IMAGING — US US FNA BIOPSY THYROID 1ST LESION
1 series · 13 of 24 positions shown · non-contrast
Comparison: US 08/21/21

MEDICATIONS:
None

COMPLICATIONS:
None immediate.

INDICATION: Indeterminate thyroid nodules

EXAM:
ULTRASOUND GUIDED FINE NEEDLE ASPIRATION OF INDETERMINATE THYROID
NODULES
TECHNIQUE: Informed written consent was obtained from the patient after a
discussion of the risks, benefits and alternatives to treatment.
Questions regarding the procedure were encouraged and answered. A
timeout was performed prior to the initiation of the procedure.

[Series 1: us fna biopsy thyroid 1st lesion · 0.08mm/px · 24 acquisitions, 13 frames shown]
[im 1/24]
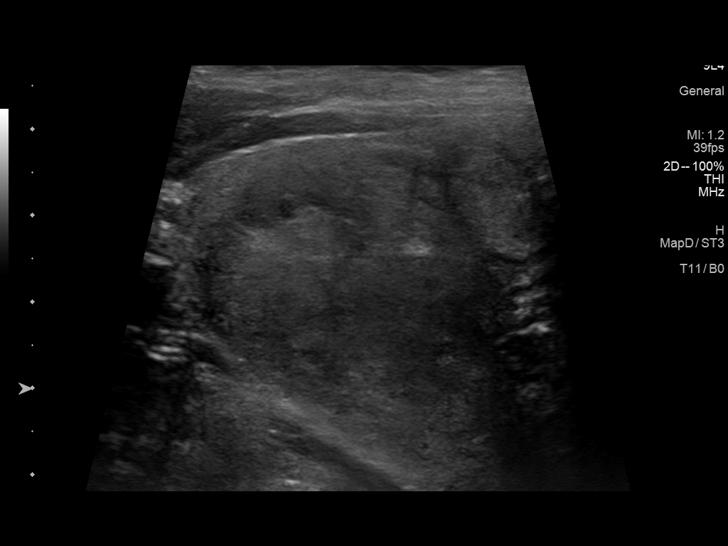
[im 3/24]
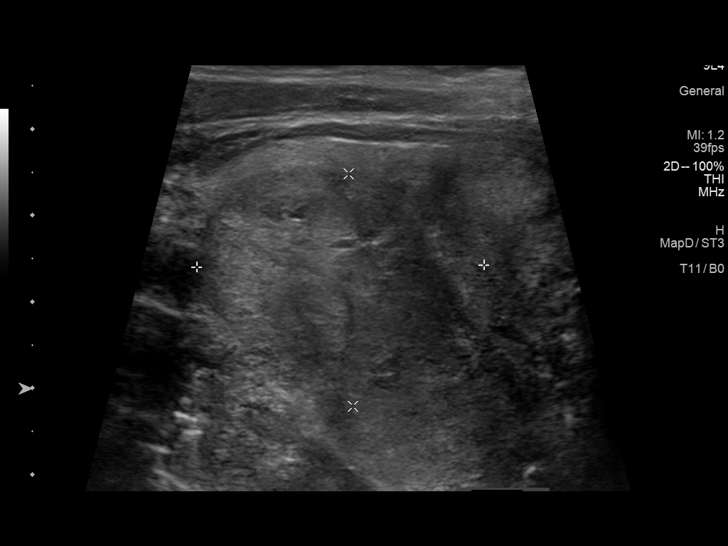
[im 5/24]
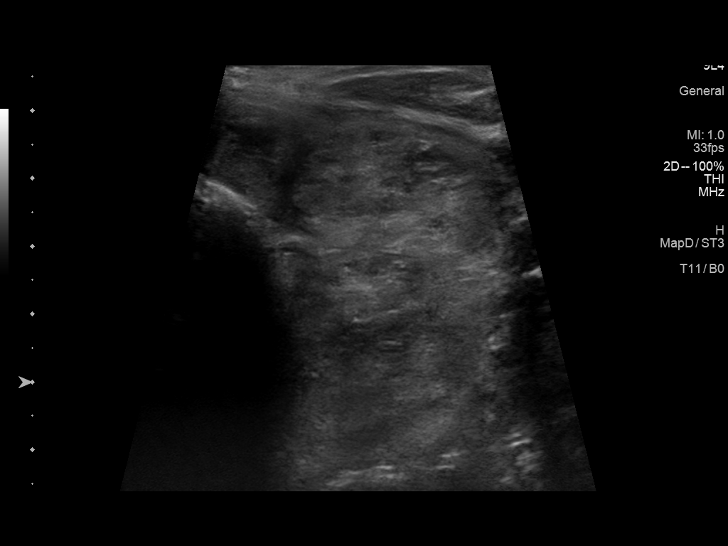
[im 7/24]
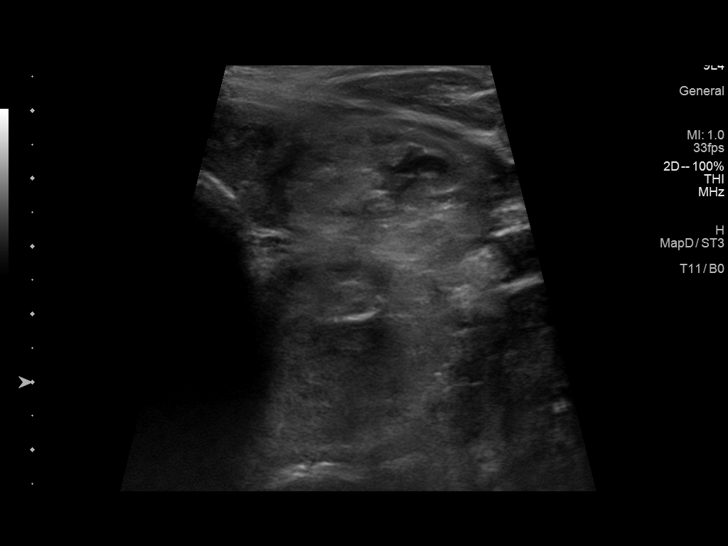
[im 9/24]
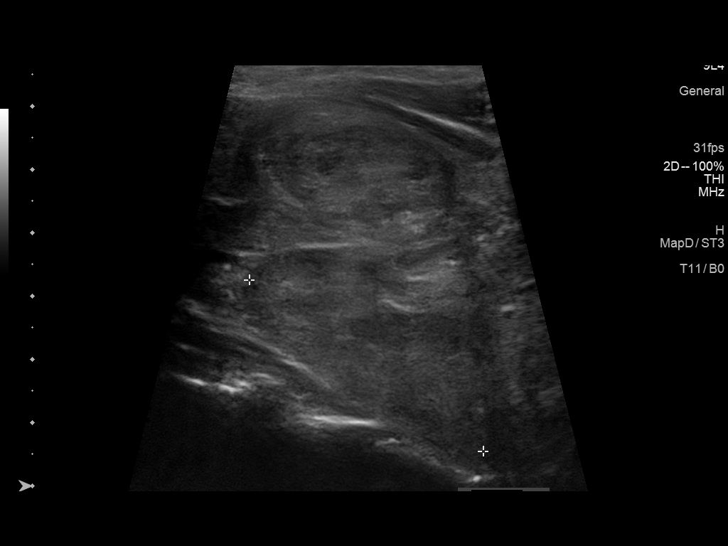
[im 11/24]
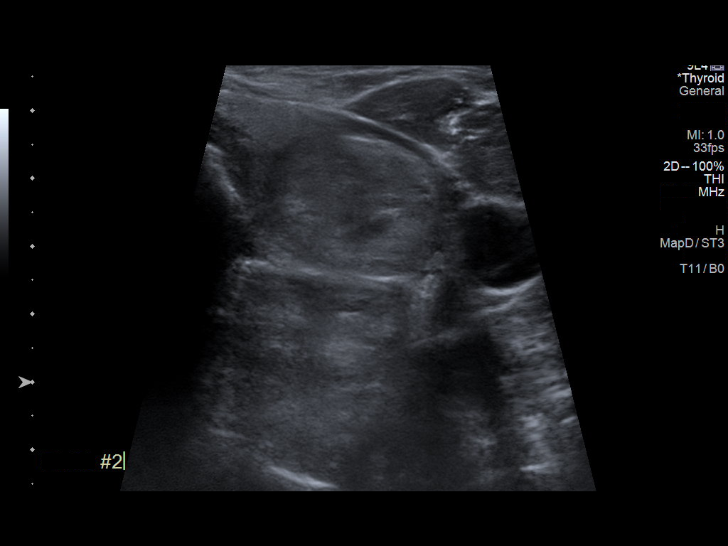
[im 13/24]
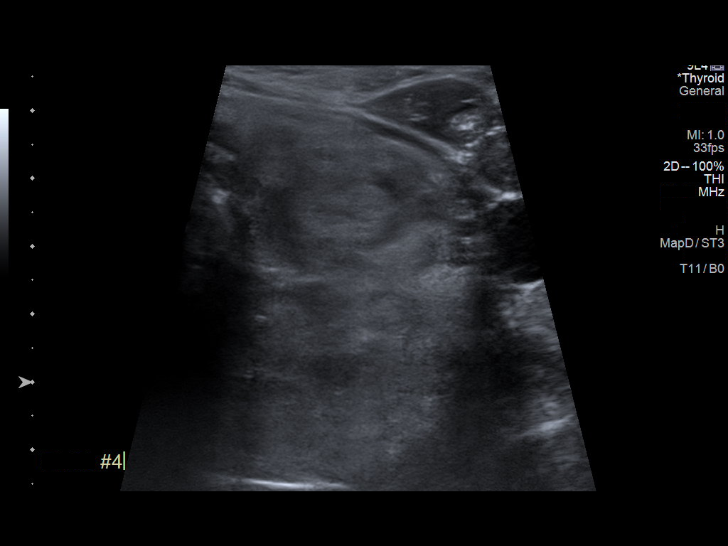
[im 14/24]
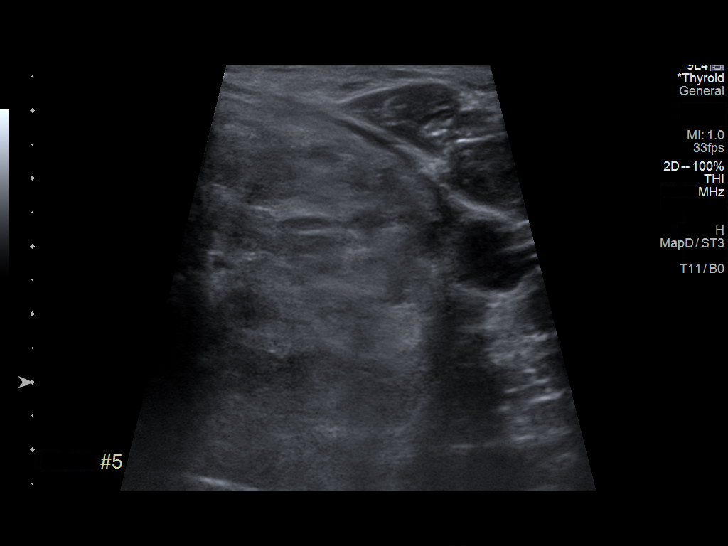
[im 16/24]
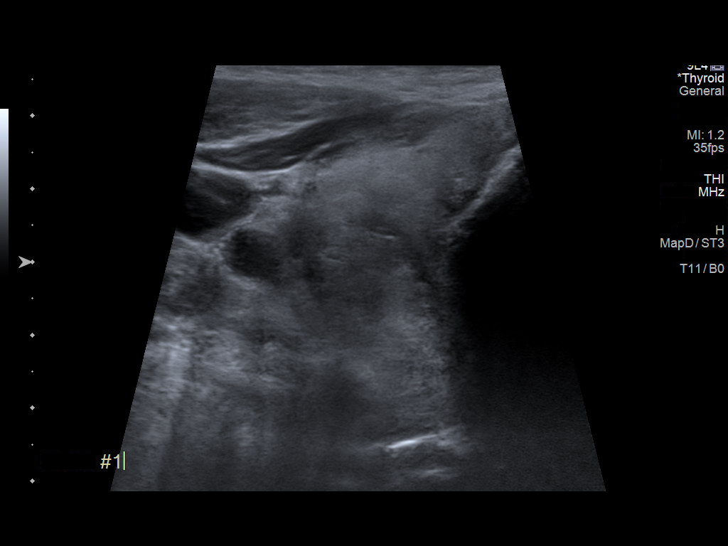
[im 18/24]
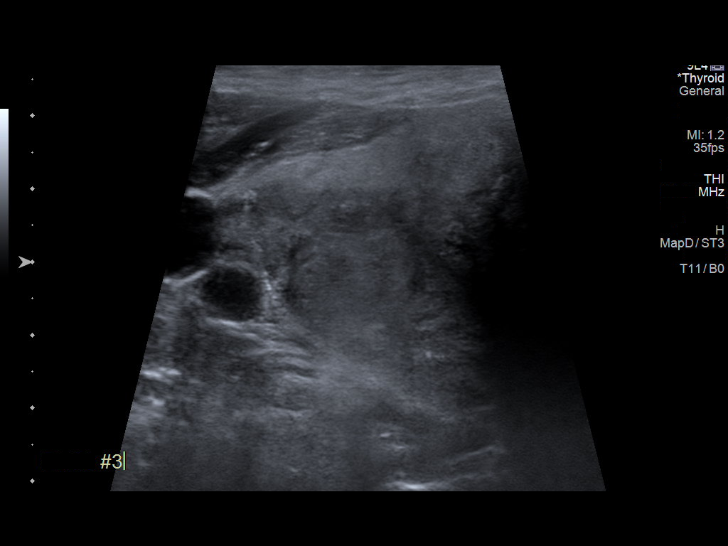
[im 20/24]
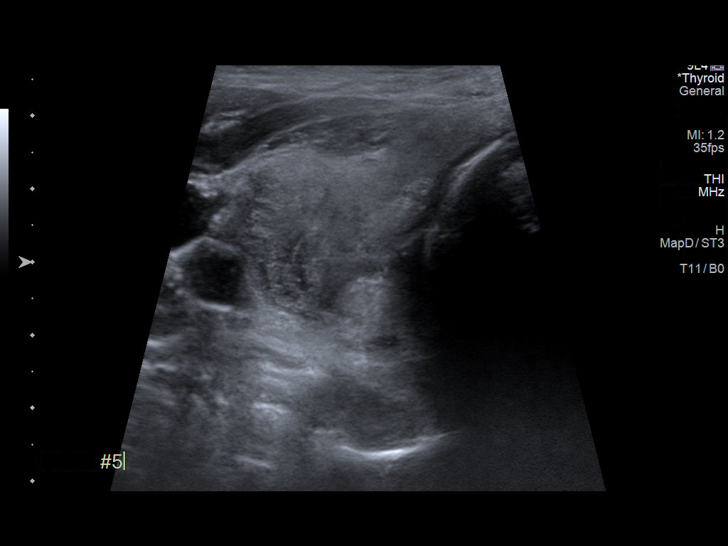
[im 22/24]
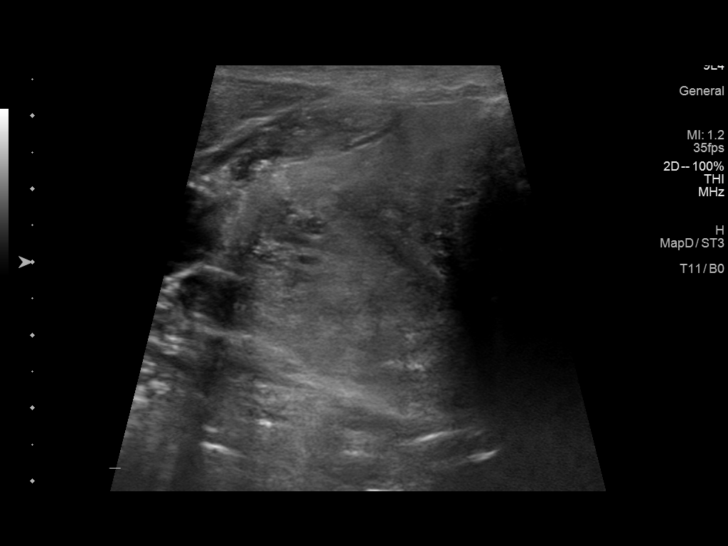
[im 24/24]
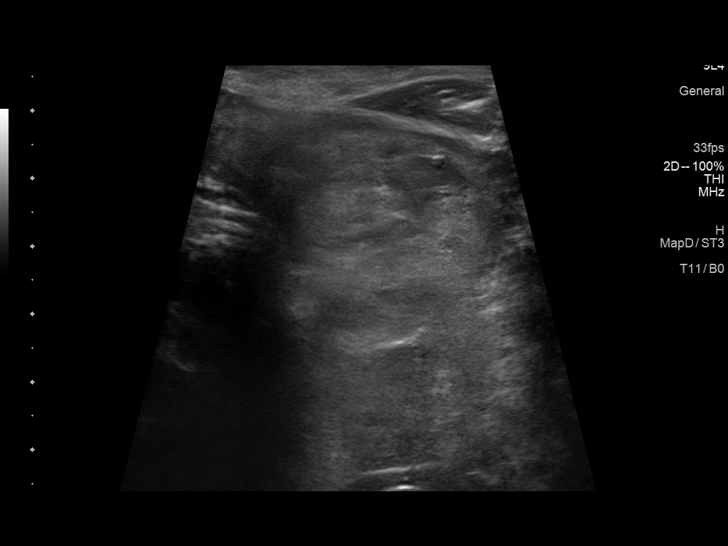

[13 of 24 positions shown; findings below may reference images not displayed]

Pre-procedural ultrasound scanning demonstrated unchanged size and
appearance of the indeterminate nodules within the left and right
lobes

The procedure was planned. The neck was prepped in the usual sterile
fashion, and a sterile drape was applied covering the operative
field. A timeout was performed prior to the initiation of the
procedure. Local anesthesia was provided with 1% lidocaine.

Under direct ultrasound guidance, 5 FNA biopsies were performed of
the left posterior nodule with a 22 gauge Inrad needle. Multiple
ultrasound images were saved for procedural documentation purposes.
The samples were prepared and submitted to pathology. 2 of these
specimens were reserved for Afirma testing.

Under direct ultrasound guidance, 5 FNA biopsies were performed of
the right mid nodule with a 27 gauge needle. Multiple ultrasound
images were saved for procedural documentation purposes. The samples
were prepared and submitted to pathology. 2 of these specimens were
reserved for Afirma testing.

Limited post procedural scanning was negative for hematoma or
additional complication. Dressings were placed. The patient
tolerated the above procedures procedure well without immediate
postprocedural complication.
FINDINGS: Nodule reference number based on prior diagnostic ultrasound: 6

Maximum size: 3.6cm

Location: Left; posterior

ACR TI-RADS risk category: TR4 (4-6 points)

Reason for biopsy: meets ACR TI-RADS criteria

_________________________________________________________

Nodule reference number based on prior diagnostic ultrasound: 2

Maximum size: 3.2cm

Location: Right; Mid

ACR TI-RADS risk category: TR4 (4-6 points)

Reason for biopsy: meets ACR TI-RADS criteria

Ultrasound imaging confirms appropriate placement of the needles
within the thyroid nodule.
IMPRESSION: 1. Technically successful ultrasound guided fine needle aspiration
of left posterior thyroid nodule
2. Technically successful ultrasound guided fine needle aspiration
of right mid lobe thyroid nodule

Read by Ol, Oliver

## 2023-09-03 IMAGING — US US FNA BIOPSY THYROID 1ST LESION
1 series · 13 of 24 positions shown · non-contrast
Comparison: US 08/21/21

MEDICATIONS:
None

COMPLICATIONS:
None immediate.

INDICATION: Indeterminate thyroid nodules

EXAM:
ULTRASOUND GUIDED FINE NEEDLE ASPIRATION OF INDETERMINATE THYROID
NODULES
TECHNIQUE: Informed written consent was obtained from the patient after a
discussion of the risks, benefits and alternatives to treatment.
Questions regarding the procedure were encouraged and answered. A
timeout was performed prior to the initiation of the procedure.

[Series 1: us fna biopsy thyroid 1st lesion · 0.08mm/px · 24 acquisitions, 13 frames shown]
[im 1/24]
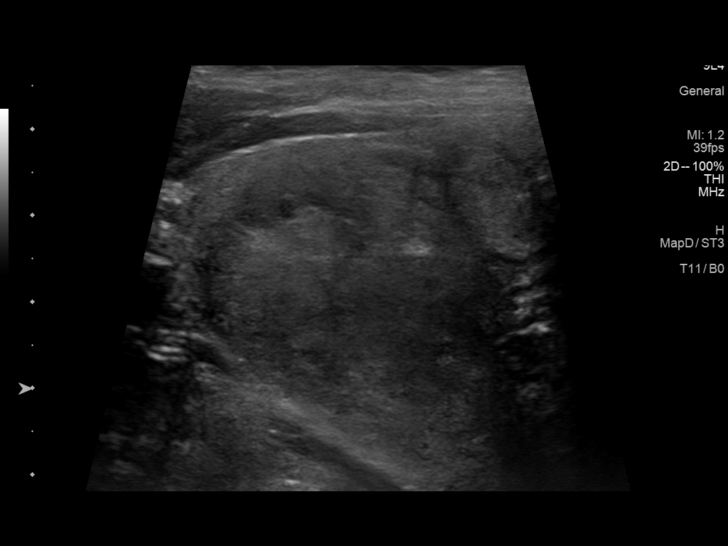
[im 3/24]
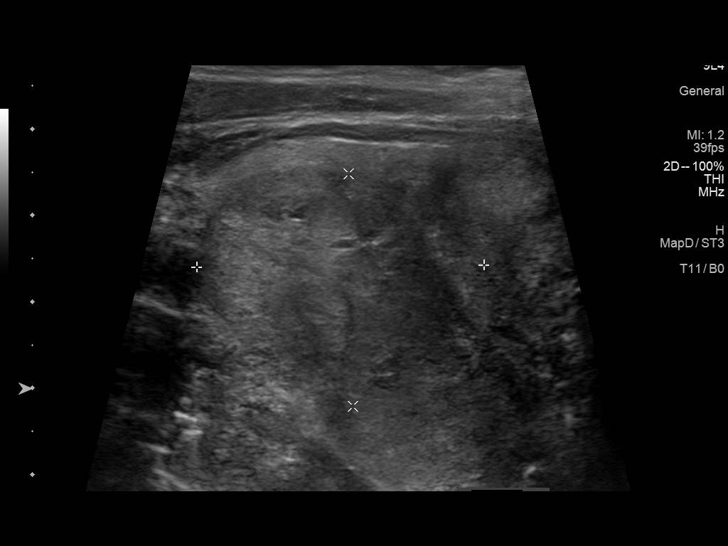
[im 5/24]
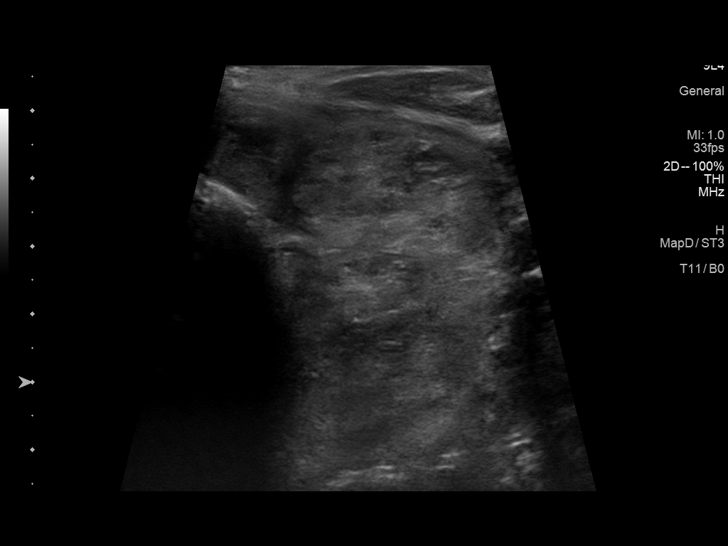
[im 7/24]
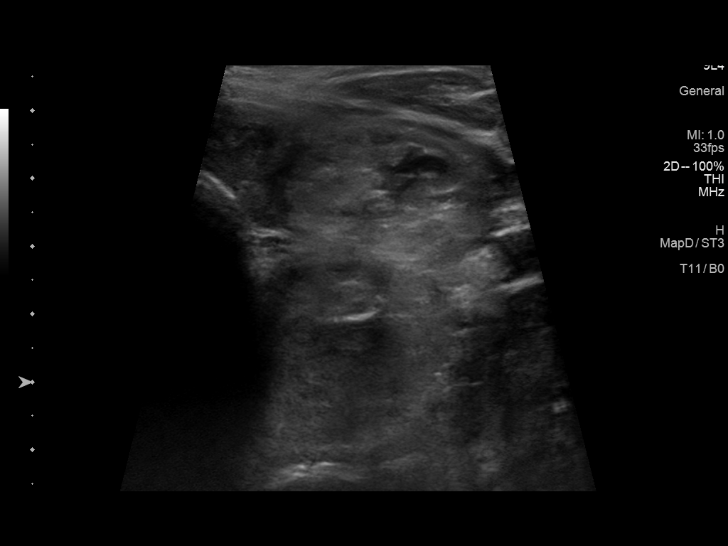
[im 9/24]
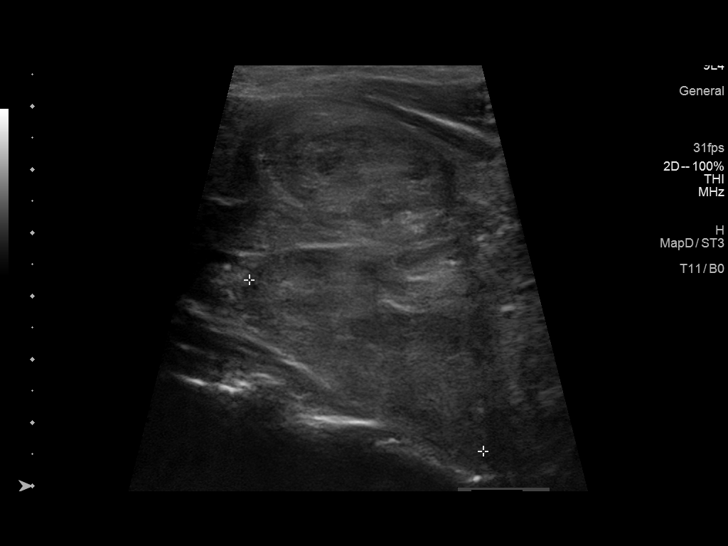
[im 11/24]
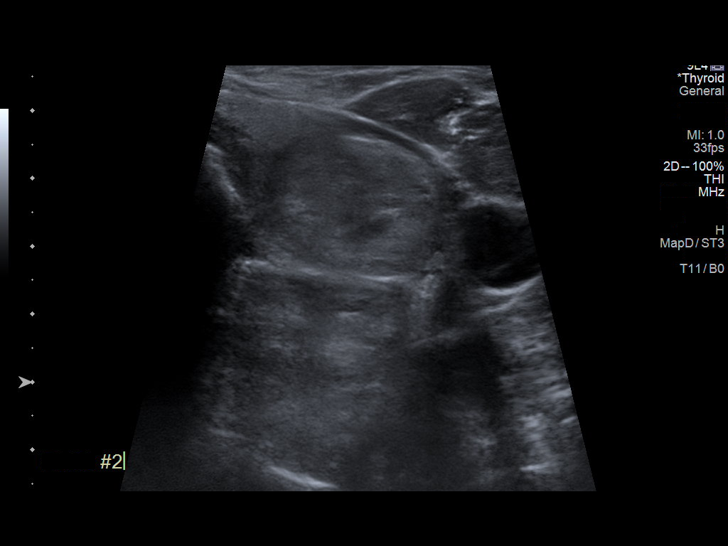
[im 13/24]
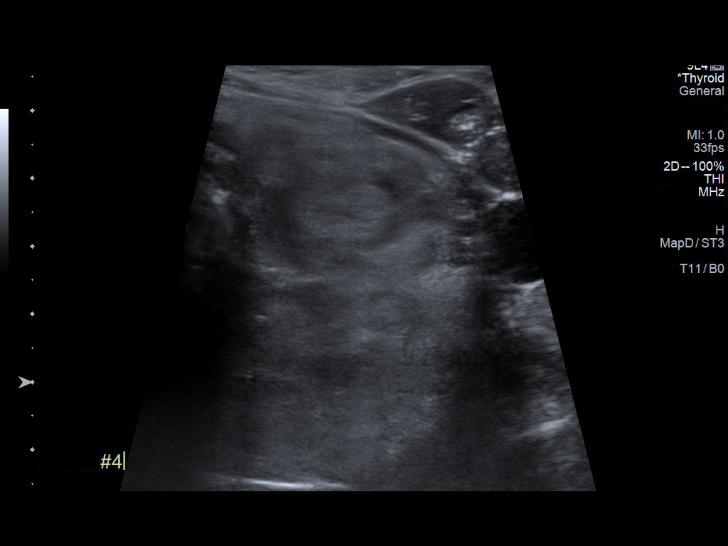
[im 14/24]
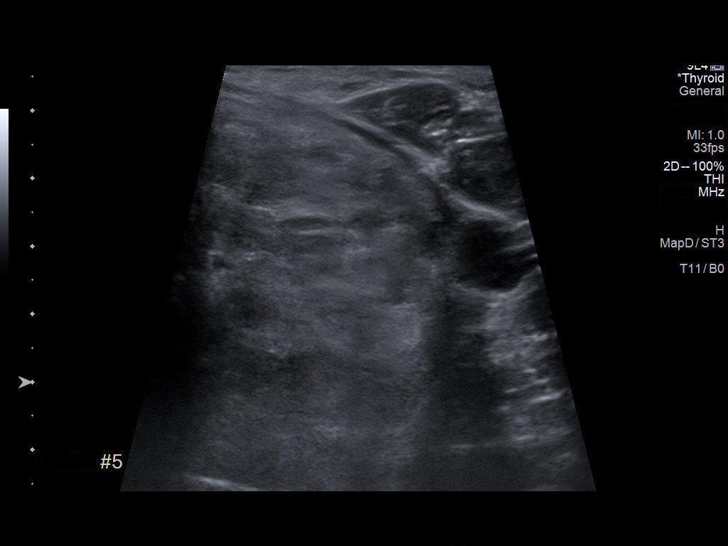
[im 16/24]
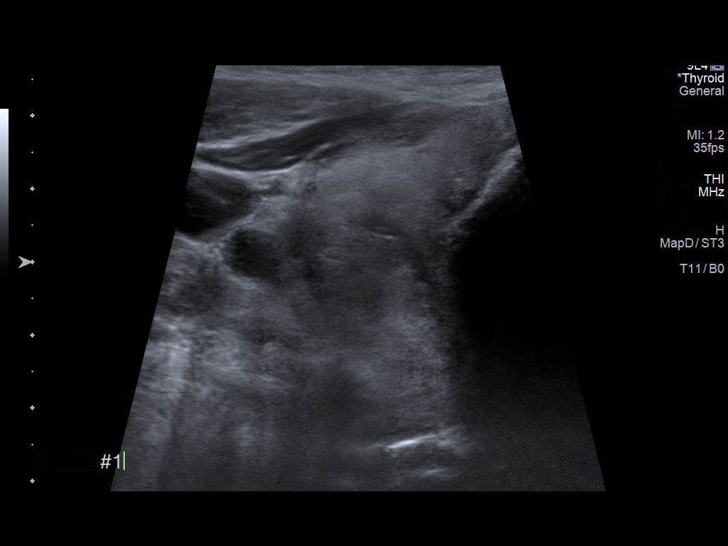
[im 18/24]
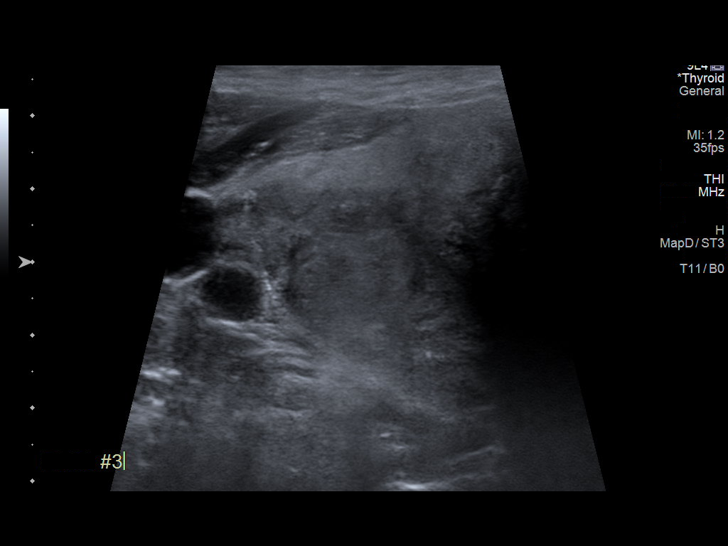
[im 20/24]
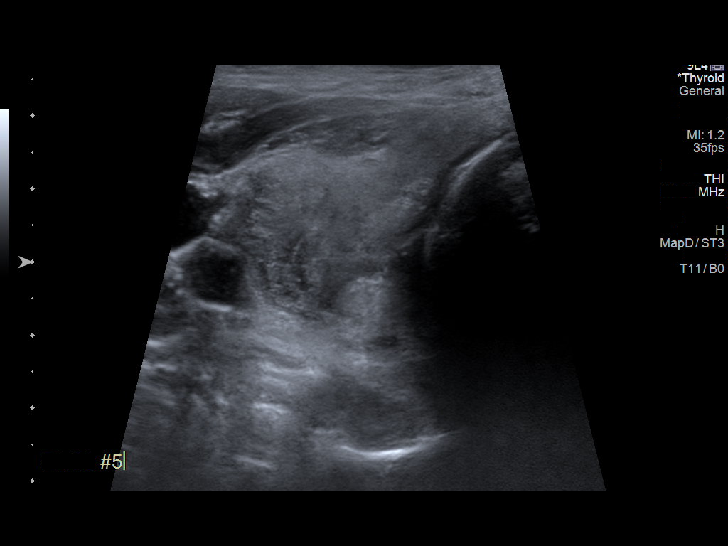
[im 22/24]
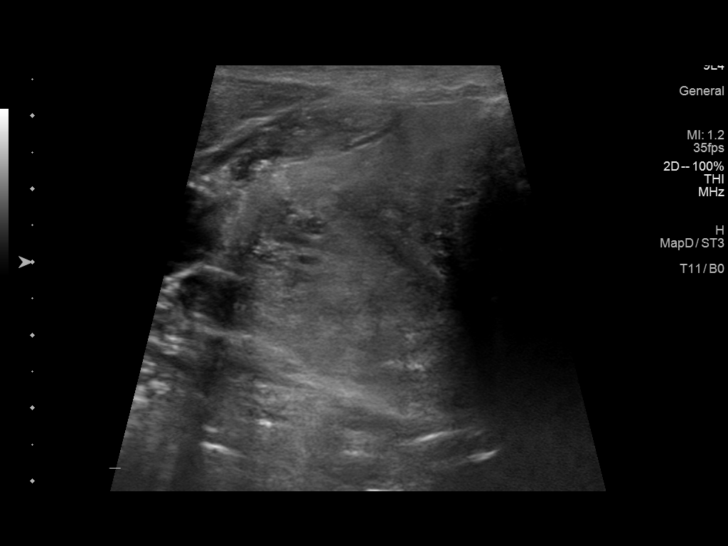
[im 24/24]
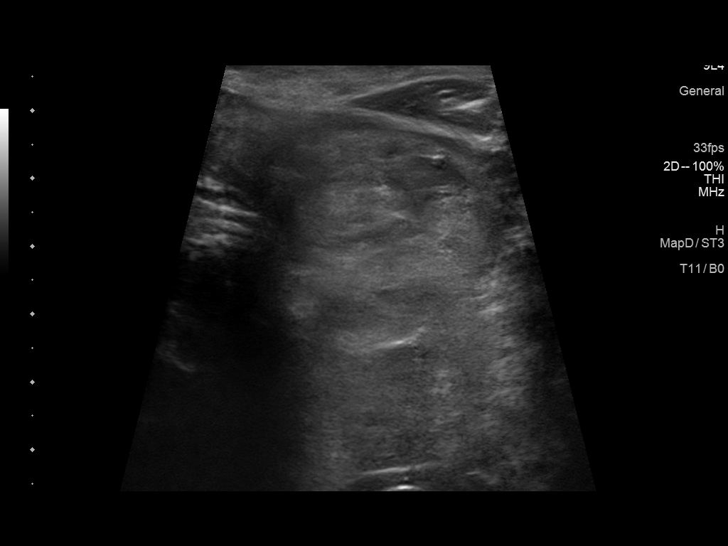

[13 of 24 positions shown; findings below may reference images not displayed]

Pre-procedural ultrasound scanning demonstrated unchanged size and
appearance of the indeterminate nodules within the left and right
lobes

The procedure was planned. The neck was prepped in the usual sterile
fashion, and a sterile drape was applied covering the operative
field. A timeout was performed prior to the initiation of the
procedure. Local anesthesia was provided with 1% lidocaine.

Under direct ultrasound guidance, 5 FNA biopsies were performed of
the left posterior nodule with a 22 gauge Inrad needle. Multiple
ultrasound images were saved for procedural documentation purposes.
The samples were prepared and submitted to pathology. 2 of these
specimens were reserved for Afirma testing.

Under direct ultrasound guidance, 5 FNA biopsies were performed of
the right mid nodule with a 27 gauge needle. Multiple ultrasound
images were saved for procedural documentation purposes. The samples
were prepared and submitted to pathology. 2 of these specimens were
reserved for Afirma testing.

Limited post procedural scanning was negative for hematoma or
additional complication. Dressings were placed. The patient
tolerated the above procedures procedure well without immediate
postprocedural complication.
FINDINGS: Nodule reference number based on prior diagnostic ultrasound: 6

Maximum size: 3.6cm

Location: Left; posterior

ACR TI-RADS risk category: TR4 (4-6 points)

Reason for biopsy: meets ACR TI-RADS criteria

_________________________________________________________

Nodule reference number based on prior diagnostic ultrasound: 2

Maximum size: 3.2cm

Location: Right; Mid

ACR TI-RADS risk category: TR4 (4-6 points)

Reason for biopsy: meets ACR TI-RADS criteria

Ultrasound imaging confirms appropriate placement of the needles
within the thyroid nodule.
IMPRESSION: 1. Technically successful ultrasound guided fine needle aspiration
of left posterior thyroid nodule
2. Technically successful ultrasound guided fine needle aspiration
of right mid lobe thyroid nodule

Read by Ol, Oliver

## 2023-11-18 ENCOUNTER — Ambulatory Visit: Payer: Medicare Other | Admitting: Podiatry

## 2023-11-18 DIAGNOSIS — B351 Tinea unguium: Secondary | ICD-10-CM | POA: Diagnosis not present

## 2023-11-18 DIAGNOSIS — M79675 Pain in left toe(s): Secondary | ICD-10-CM

## 2023-11-18 DIAGNOSIS — M79674 Pain in right toe(s): Secondary | ICD-10-CM | POA: Diagnosis not present

## 2023-11-18 NOTE — Progress Notes (Unsigned)
       Subjective:  Patient ID: Alexander Cochran, male    DOB: [birth year],  MRN: [id number]  Alexander Cochran presents to clinic today for:  Chief Complaint  Patient presents with   [facility]    [facility] with out callous. last A1c 6.9, five months ago. No anti coag.    Patient notes nails are thick, discolored, elongated and painful in shoegear when trying to ambulate.  Patient notes he had 1 day of experiencing itching to the instep of his feet last month but has not had any symptoms or similar sensation since then.  PCP is Sagardia, Isidro Margo, MD.  Past Medical History:  Diagnosis Date   Diabetes mellitus without complication (HCC)    Hyperlipidemia    Hypertension     Past Surgical History:  Procedure Laterality Date   CYST EXCISION PERINEAL  1984    Allergies  Allergen Reactions   Gabapentin  Other (See Comments)    Hallucinations     Review of Systems: Negative except as noted in the HPI.  Objective:  Alexander Cochran is a pleasant 68 y.o. male in NAD. AAO x 3.  Vascular Examination: Capillary refill time is 3-5 seconds to toes bilateral. Palpable pedal pulses b/l LE. Digital hair present b/l.  Skin temperature gradient WNL b/l.   Dermatological Examination: Pedal skin with normal turgor, texture and tone b/l. No open wounds. No interdigital macerations b/l. Toenails x10 are 3mm thick, discolored, dystrophic with subungual debris. There is pain with compression of the nail plates.  They are elongated x10.  No evidence of tinea pedis involvement is noted to the instep bilateral.     Latest Ref Rng & Units 06/16/2023    9:57 AM  Hemoglobin A1C  Hemoglobin-A1c 4.0 - 5.6 % 6.3    Assessment/Plan: 1. Pain due to onychomycosis of toenails of both feet   2. Tinea pedis of both feet    The mycotic toenails were sharply debrided x10 with sterile nail nippers and a power debriding burr to decrease bulk/thickness and length.    Return in about 3 months (around 02/17/2024) for  [facility].   Joe Murders, DPM, FACFAS [facility]     [address] [place], [place] [address]                Office [phone]  Fax [phone]

## 2023-12-14 ENCOUNTER — Encounter: Payer: Self-pay | Admitting: Emergency Medicine

## 2023-12-14 ENCOUNTER — Ambulatory Visit (INDEPENDENT_AMBULATORY_CARE_PROVIDER_SITE_OTHER): Payer: Medicare Other | Admitting: Emergency Medicine

## 2023-12-14 VITALS — BP 184/104 | HR 88 | Temp 98.2°F | Ht 66.0 in | Wt 244.0 lb

## 2023-12-14 DIAGNOSIS — E1159 Type 2 diabetes mellitus with other circulatory complications: Secondary | ICD-10-CM

## 2023-12-14 DIAGNOSIS — Z7984 Long term (current) use of oral hypoglycemic drugs: Secondary | ICD-10-CM

## 2023-12-14 DIAGNOSIS — N1831 Chronic kidney disease, stage 3a: Secondary | ICD-10-CM

## 2023-12-14 DIAGNOSIS — I152 Hypertension secondary to endocrine disorders: Secondary | ICD-10-CM | POA: Diagnosis not present

## 2023-12-14 DIAGNOSIS — E785 Hyperlipidemia, unspecified: Secondary | ICD-10-CM | POA: Diagnosis not present

## 2023-12-14 DIAGNOSIS — E1169 Type 2 diabetes mellitus with other specified complication: Secondary | ICD-10-CM | POA: Diagnosis not present

## 2023-12-14 DIAGNOSIS — Z1211 Encounter for screening for malignant neoplasm of colon: Secondary | ICD-10-CM

## 2023-12-14 LAB — POCT GLYCOSYLATED HEMOGLOBIN (HGB A1C): Hemoglobin A1C: 5.5 % (ref 4.0–5.6)

## 2023-12-14 MED ORDER — AMLODIPINE BESYLATE 10 MG PO TABS: 10.0000 mg | ORAL_TABLET | Freq: Every day | ORAL | 3 refills | Status: DC

## 2023-12-14 NOTE — Progress Notes (Signed)
 Alexander Cochran 68 y.o.   Chief Complaint  Patient presents with   Follow-up    6 month f/u for HTN and DM. No other concerns.     HISTORY OF PRESENT ILLNESS: This is a 68 y.o. male A1A here for 69-month follow-up of hypertension and diabetes Overall doing well.  Has no complaints or any other medical concerns today.  HPI   Prior to Admission medications   Medication Sig Start Date End Date Taking? Authorizing Provider  B-D ULTRAFINE III SHORT PEN 31G X 8 MM MISC USE AS DIRECTED 11/04/21  Yes Seriyah Collison, Isidro Margo, MD  dapagliflozin  propanediol (FARXIGA ) 10 MG TABS tablet Take 1 tablet (10 mg total) by mouth daily before breakfast. 06/16/23  Yes Brodi Kari, Isidro Margo, MD  ketoconazole  (NIZORAL ) 2 % cream Apply 1gm to both feet and between toes once daily 08/20/23  Yes McCaughan, Dia D, DPM  simvastatin  (ZOCOR ) 40 MG tablet TAKE 1 TABLET BY MOUTH EVERY DAY 11/14/22  Yes Onisha Cedeno, Isidro Margo, MD  valsartan  (DIOVAN ) 160 MG tablet Take 1 tablet (160 mg total) by mouth daily. 06/16/23  Yes Ladona Rosten, Isidro Margo, MD  gabapentin  (NEURONTIN ) 300 MG capsule TAKE 1 CAPSULE BY MOUTH TWICE A DAY Patient not taking: Reported on 12/14/2023 09/22/21   Elvira Hammersmith, MD    Allergies  Allergen Reactions   Gabapentin  Other (See Comments)    Hallucinations     Patient Active Problem List   Diagnosis Date Noted   Bilateral tinnitus 09/09/2022   Multinodular goiter 08/01/2021   Hypertension associated with type 2 diabetes mellitus (HCC) 12/26/2020   Type 1 diabetes (HCC) 03/13/2020   Diabetic polyneuropathy associated with type 2 diabetes mellitus (HCC) 09/26/2019   Type 2 diabetes mellitus with hyperglycemia (HCC) 09/26/2019   Dyslipidemia associated with type 2 diabetes mellitus (HCC) 09/26/2019   Stage 3a chronic kidney disease (HCC) 09/26/2019   Left renal mass 08/30/2018   Type 2 MI (myocardial infarction) (HCC) 08/30/2018   Morbid obesity (HCC) 08/28/2018   BMI 38.0-38.9,adult  10/21/2011   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   Hyperlipemia 10/21/2011    Past Medical History:  Diagnosis Date   Diabetes mellitus without complication (HCC)    Hyperlipidemia    Hypertension     Past Surgical History:  Procedure Laterality Date   CYST EXCISION PERINEAL  1984    Social History   Socioeconomic History   Marital status: Married    Spouse name: Not on file   Number of children: Not on file   Years of education: Not on file   Highest education level: Not on file  Occupational History   Not on file  Tobacco Use   Smoking status: Former    Current packs/day: 0.00    Average packs/day: 0.3 packs/day for 2.0 years (0.5 ttl pk-yrs)    Types: Cigarettes    Start date: 12/29/1971    Quit date: 12/28/1973    Years since quitting: 49.9   Smokeless tobacco: Never  Substance and Sexual Activity   Alcohol use: Yes   Drug use: No   Sexual activity: Never  Other Topics Concern   Not on file  Social History Narrative   Not on file   Social Drivers of Health   Financial Resource Strain: Low Risk  (08/28/2018)   Received from [facility], [facility]   Overall Financial Resource Strain (CARDIA)    Difficulty of Paying Living Expenses: Not very hard  Food Insecurity: No Food Insecurity (  08/28/2018)   Received from [facility]   Hunger Vital Sign    Worried About Running Out of Food in the Last Year: Never true    Ran Out of Food in the Last Year: Never true  Transportation Needs: No Transportation Needs (08/28/2018)   Received from [facility], [facility]   [facility] - Transportation    Lack of Transportation (Medical): No    Lack of Transportation (Non-Medical): No  Physical Activity: Inactive (08/28/2018)   Received from [facility], [facility]   Exercise Vital Sign    Days of Exercise per Week: 0 days    Minutes of Exercise per Session: 0 min  Stress: No Stress Concern Present (08/28/2018)   Received from [facility] - Occupational Stress Questionnaire    Feeling of Stress : Only a little  Social Connections: Unknown (08/28/2018)   Received from [facility], [facility]   Social Connection and Isolation Panel [NHANES]    Frequency of Communication with Friends and Family: More than three times a week    Frequency of Social Gatherings with Friends and Family: More than three times a week    Attends Religious Services: Not on file    Active Member of Clubs or Organizations: Not on file    Attends Banker Meetings: Not on file    Marital Status: Not on file  Intimate Partner Violence: Not At Risk (08/28/2018)   Received from [facility], [facility]   Humiliation, Afraid, Rape, and Kick questionnaire    Fear of Current or Ex-Partner: No    Emotionally Abused: No    Physically Abused: No    Sexually Abused: No    Family History  Problem Relation Age of Onset   Diabetes Mother    Hypertension Sister    Hypertension Brother      Review of Systems  Constitutional: Negative.  Negative for chills and fever.  HENT: Negative.  Negative for congestion and sore throat.   Respiratory: Negative.  Negative for cough and shortness of breath.   Cardiovascular: Negative.  Negative for chest pain and palpitations.  Gastrointestinal:  Negative for abdominal pain, diarrhea, nausea and vomiting.  Genitourinary: Negative.  Negative for dysuria and hematuria.  Skin: Negative.  Negative for rash.  Neurological: Negative.  Negative for dizziness and headaches.  All other systems reviewed and are negative.   Vitals:   12/14/23 0949  BP: (!) 184/104  Pulse: 88  Temp: 98.2 F (36.8 C)  SpO2: 97%    Physical Exam Vitals reviewed.  Constitutional:      Appearance: Normal appearance.  HENT:     Head: Normocephalic.  Eyes:     Extraocular Movements: Extraocular movements intact.  Cardiovascular:     Rate and  Rhythm: Normal rate and regular rhythm.     Pulses: Normal pulses.     Heart sounds: Normal heart sounds.  Pulmonary:     Effort: Pulmonary effort is normal.     Breath sounds: Normal breath sounds.  Neurological:     General: No focal deficit present.     Mental Status: He is alert and oriented to person, place, and time.  Psychiatric:        Mood and Affect: Mood normal.        Behavior: Behavior normal.    Results for orders placed or performed in  visit on 12/14/23 (from the past 24 hours)  POCT HgB A1C     Status: None   Collection Time: 12/14/23  1:11 PM  Result Value Ref Range   Hemoglobin A1C 5.5 4.0 - 5.6 %   HbA1c POC (<> result, manual entry)     HbA1c, POC (prediabetic range)     HbA1c, POC (controlled diabetic range)        ASSESSMENT & PLAN: A total of 45 minutes was spent with the patient and counseling/coordination of care regarding preparing for this visit, review of most recent office visit notes, review of multiple chronic medical conditions and their management, cardiovascular risks associated with uncontrolled hypertension, review of all medications and changes made, review of most recent bloodwork results, review of health maintenance items, education on nutrition, prognosis, documentation, and need for follow up.   Problem List Items Addressed This Visit       Cardiovascular and Mediastinum   Hypertension associated with type 2 diabetes mellitus (HCC) - Primary   Elevated blood pressure reading in the office History of recurrent hypercalcemia Recommend to not use hydrochlorothiazide  We will change valsartan  160 mg daily to Amlodipine  10 mg Well-controlled diabetes with hemoglobin A1c of 5.5.  Patient with history of CKD.  Recommend to continue Farxiga  10 mg daily Cardiovascular risks associated with hypertension and diabetes discussed Diet and nutrition discussed Follow-up in 6 months      Relevant Medications   amLODipine  (NORVASC ) 10 MG tablet      Endocrine   Dyslipidemia associated with type 2 diabetes mellitus (HCC)   Relevant Orders   POCT HgB A1C     Genitourinary   Stage 3a chronic kidney disease (HCC)   Recently evaluated by nephrologist History of hypercalcemia Advised to stay well-hydrated and avoid NSAIDs Continue Farxiga  10 mg daily      Other Visit Diagnoses       Screening for colon cancer       Relevant Orders   Cologuard        Patient Instructions  Stop valsartan  Start amlodipine  10 mg daily Continue Farxiga  and simvastatin  Monitor blood pressure readings at home daily for the next several weeks and keep a log.  Contact the office if numbers persistently abnormal.  If abnormal will consider reintroducing valsartan .  Hypertension, Adult High blood pressure (hypertension) is when the force of blood pumping through the arteries is too strong. The arteries are the blood vessels that carry blood from the heart throughout the body. Hypertension forces the heart to work harder to pump blood and may cause arteries to become narrow or stiff. Untreated or uncontrolled hypertension can lead to a heart attack, heart failure, a stroke, kidney disease, and other problems. A blood pressure reading consists of a higher number over a lower number. Ideally, your blood pressure should be below 120/80. The first ("top") number is called the systolic pressure. It is a measure of the pressure in your arteries as your heart beats. The second ("bottom") number is called the diastolic pressure. It is a measure of the pressure in your arteries as the heart relaxes. What are the causes? The exact cause of this condition is not known. There are some conditions that result in high blood pressure. What increases the risk? Certain factors may make you more likely to develop high blood pressure. Some of these risk factors are under your control, including: Smoking. Not getting enough exercise or physical activity. Being  overweight. Having too much fat, sugar, calories, or  salt (sodium) in your diet. Drinking too much alcohol. Other risk factors include: Having a personal history of heart disease, diabetes, high cholesterol, or kidney disease. Stress. Having a family history of high blood pressure and high cholesterol. Having obstructive sleep apnea. Age. The risk increases with age. What are the signs or symptoms? High blood pressure may not cause symptoms. Very high blood pressure (hypertensive crisis) may cause: Headache. Fast or irregular heartbeats (palpitations). Shortness of breath. Nosebleed. Nausea and vomiting. Vision changes. Severe chest pain, dizziness, and seizures. How is this diagnosed? This condition is diagnosed by measuring your blood pressure while you are seated, with your arm resting on a flat surface, your legs uncrossed, and your feet flat on the floor. The cuff of the blood pressure monitor will be placed directly against the skin of your upper arm at the level of your heart. Blood pressure should be measured at least twice using the same arm. Certain conditions can cause a difference in blood pressure between your right and left arms. If you have a high blood pressure reading during one visit or you have normal blood pressure with other risk factors, you may be asked to: Return on a different day to have your blood pressure checked again. Monitor your blood pressure at home for 1 week or longer. If you are diagnosed with hypertension, you may have other blood or imaging tests to help your health care provider understand your overall risk for other conditions. How is this treated? This condition is treated by making healthy lifestyle changes, such as eating healthy foods, exercising more, and reducing your alcohol intake. You may be referred for counseling on a healthy diet and physical activity. Your health care provider may prescribe medicine if lifestyle changes are not enough to  get your blood pressure under control and if: Your systolic blood pressure is above 130. Your diastolic blood pressure is above 80. Your personal target blood pressure may vary depending on your medical conditions, your age, and other factors. Follow these instructions at home: Eating and drinking  Eat a diet that is high in fiber and potassium, and low in sodium, added sugar, and fat. An example of this eating plan is called the DASH diet. DASH stands for Dietary Approaches to Stop Hypertension. To eat this way: Eat plenty of fresh fruits and vegetables. Try to fill one half of your plate at each meal with fruits and vegetables. Eat whole grains, such as whole-wheat pasta, brown rice, or whole-grain bread. Fill about one fourth of your plate with whole grains. Eat or drink low-fat dairy products, such as skim milk or low-fat yogurt. Avoid fatty cuts of meat, processed or cured meats, and poultry with skin. Fill about one fourth of your plate with lean proteins, such as fish, chicken without skin, beans, eggs, or tofu. Avoid pre-made and processed foods. These tend to be higher in sodium, added sugar, and fat. Reduce your daily sodium intake. Many people with hypertension should eat less than 1,500 mg of sodium a day. Do not drink alcohol if: Your health care provider tells you not to drink. You are pregnant, may be pregnant, or are planning to become pregnant. If you drink alcohol: Limit how much you have to: 0-1 drink a day for women. 0-2 drinks a day for men. Know how much alcohol is in your drink. In the [place] one drink equals one 12 oz bottle of beer (355 mL), one 5 oz glass of wine (148 mL), or one 1  oz glass of hard liquor (44 mL). Lifestyle  Work with your health care provider to maintain a healthy body weight or to lose weight. Ask what an ideal weight is for you. Get at least 30 minutes of exercise that causes your heart to beat faster (aerobic exercise) most days of the week.  Activities may include walking, swimming, or biking. Include exercise to strengthen your muscles (resistance exercise), such as Pilates or lifting weights, as part of your weekly exercise routine. Try to do these types of exercises for 30 minutes at least 3 days a week. Do not use any products that contain nicotine or tobacco. These products include cigarettes, chewing tobacco, and vaping devices, such as e-cigarettes. If you need help quitting, ask your health care provider. Monitor your blood pressure at home as told by your health care provider. Keep all follow-up visits. This is important. Medicines Take over-the-counter and prescription medicines only as told by your health care provider. Follow directions carefully. Blood pressure medicines must be taken as prescribed. Do not skip doses of blood pressure medicine. Doing this puts you at risk for problems and can make the medicine less effective. Ask your health care provider about side effects or reactions to medicines that you should watch for. Contact a health care provider if you: Think you are having a reaction to a medicine you are taking. Have headaches that keep coming back (recurring). Feel dizzy. Have swelling in your ankles. Have trouble with your vision. Get help right away if you: Develop a severe headache or confusion. Have unusual weakness or numbness. Feel faint. Have severe pain in your chest or abdomen. Vomit repeatedly. Have trouble breathing. These symptoms may be an emergency. Get help right away. Call 911. Do not wait to see if the symptoms will go away. Do not drive yourself to the hospital. Summary Hypertension is when the force of blood pumping through your arteries is too strong. If this condition is not controlled, it may put you at risk for serious complications. Your personal target blood pressure may vary depending on your medical conditions, your age, and other factors. For most people, a normal blood  pressure is less than 120/80. Hypertension is treated with lifestyle changes, medicines, or a combination of both. Lifestyle changes include losing weight, eating a healthy, low-sodium diet, exercising more, and limiting alcohol. This information is not intended to replace advice given to you by your health care provider. Make sure you discuss any questions you have with your health care provider. Document Revised: 05/21/2021 Document Reviewed: 05/21/2021 Elsevier Patient Education  2024 Elsevier Inc.     Maryagnes Small, MD [facility]

## 2023-12-14 NOTE — Assessment & Plan Note (Signed)
 Recently evaluated by nephrologist History of hypercalcemia Advised to stay well-hydrated and avoid NSAIDs Continue Farxiga  10 mg daily

## 2023-12-14 NOTE — Patient Instructions (Addendum)
 Stop valsartan  Start amlodipine  10 mg daily Continue Farxiga  and simvastatin  Monitor blood pressure readings at home daily for the next several weeks and keep a log.  Contact the office if numbers persistently abnormal.  If abnormal will consider reintroducing valsartan .  Hypertension, Adult High blood pressure (hypertension) is when the force of blood pumping through the arteries is too strong. The arteries are the blood vessels that carry blood from the heart throughout the body. Hypertension forces the heart to work harder to pump blood and may cause arteries to become narrow or stiff. Untreated or uncontrolled hypertension can lead to a heart attack, heart failure, a stroke, kidney disease, and other problems. A blood pressure reading consists of a higher number over a lower number. Ideally, your blood pressure should be below 120/80. The first ("top") number is called the systolic pressure. It is a measure of the pressure in your arteries as your heart beats. The second ("bottom") number is called the diastolic pressure. It is a measure of the pressure in your arteries as the heart relaxes. What are the causes? The exact cause of this condition is not known. There are some conditions that result in high blood pressure. What increases the risk? Certain factors may make you more likely to develop high blood pressure. Some of these risk factors are under your control, including: Smoking. Not getting enough exercise or physical activity. Being overweight. Having too much fat, sugar, calories, or salt (sodium) in your diet. Drinking too much alcohol. Other risk factors include: Having a personal history of heart disease, diabetes, high cholesterol, or kidney disease. Stress. Having a family history of high blood pressure and high cholesterol. Having obstructive sleep apnea. Age. The risk increases with age. What are the signs or symptoms? High blood pressure may not cause symptoms. Very high  blood pressure (hypertensive crisis) may cause: Headache. Fast or irregular heartbeats (palpitations). Shortness of breath. Nosebleed. Nausea and vomiting. Vision changes. Severe chest pain, dizziness, and seizures. How is this diagnosed? This condition is diagnosed by measuring your blood pressure while you are seated, with your arm resting on a flat surface, your legs uncrossed, and your feet flat on the floor. The cuff of the blood pressure monitor will be placed directly against the skin of your upper arm at the level of your heart. Blood pressure should be measured at least twice using the same arm. Certain conditions can cause a difference in blood pressure between your right and left arms. If you have a high blood pressure reading during one visit or you have normal blood pressure with other risk factors, you may be asked to: Return on a different day to have your blood pressure checked again. Monitor your blood pressure at home for 1 week or longer. If you are diagnosed with hypertension, you may have other blood or imaging tests to help your health care provider understand your overall risk for other conditions. How is this treated? This condition is treated by making healthy lifestyle changes, such as eating healthy foods, exercising more, and reducing your alcohol intake. You may be referred for counseling on a healthy diet and physical activity. Your health care provider may prescribe medicine if lifestyle changes are not enough to get your blood pressure under control and if: Your systolic blood pressure is above 130. Your diastolic blood pressure is above 80. Your personal target blood pressure may vary depending on your medical conditions, your age, and other factors. Follow these instructions at home: Eating and drinking  Eat a diet that is high in fiber and potassium, and low in sodium, added sugar, and fat. An example of this eating plan is called the DASH diet. DASH stands  for Dietary Approaches to Stop Hypertension. To eat this way: Eat plenty of fresh fruits and vegetables. Try to fill one half of your plate at each meal with fruits and vegetables. Eat whole grains, such as whole-wheat pasta, brown rice, or whole-grain bread. Fill about one fourth of your plate with whole grains. Eat or drink low-fat dairy products, such as skim milk or low-fat yogurt. Avoid fatty cuts of meat, processed or cured meats, and poultry with skin. Fill about one fourth of your plate with lean proteins, such as fish, chicken without skin, beans, eggs, or tofu. Avoid pre-made and processed foods. These tend to be higher in sodium, added sugar, and fat. Reduce your daily sodium intake. Many people with hypertension should eat less than 1,500 mg of sodium a day. Do not drink alcohol if: Your health care provider tells you not to drink. You are pregnant, may be pregnant, or are planning to become pregnant. If you drink alcohol: Limit how much you have to: 0-1 drink a day for women. 0-2 drinks a day for men. Know how much alcohol is in your drink. In the [place] one drink equals one 12 oz bottle of beer (355 mL), one 5 oz glass of wine (148 mL), or one 1 oz glass of hard liquor (44 mL). Lifestyle  Work with your health care provider to maintain a healthy body weight or to lose weight. Ask what an ideal weight is for you. Get at least 30 minutes of exercise that causes your heart to beat faster (aerobic exercise) most days of the week. Activities may include walking, swimming, or biking. Include exercise to strengthen your muscles (resistance exercise), such as Pilates or lifting weights, as part of your weekly exercise routine. Try to do these types of exercises for 30 minutes at least 3 days a week. Do not use any products that contain nicotine or tobacco. These products include cigarettes, chewing tobacco, and vaping devices, such as e-cigarettes. If you need help quitting, ask your health  care provider. Monitor your blood pressure at home as told by your health care provider. Keep all follow-up visits. This is important. Medicines Take over-the-counter and prescription medicines only as told by your health care provider. Follow directions carefully. Blood pressure medicines must be taken as prescribed. Do not skip doses of blood pressure medicine. Doing this puts you at risk for problems and can make the medicine less effective. Ask your health care provider about side effects or reactions to medicines that you should watch for. Contact a health care provider if you: Think you are having a reaction to a medicine you are taking. Have headaches that keep coming back (recurring). Feel dizzy. Have swelling in your ankles. Have trouble with your vision. Get help right away if you: Develop a severe headache or confusion. Have unusual weakness or numbness. Feel faint. Have severe pain in your chest or abdomen. Vomit repeatedly. Have trouble breathing. These symptoms may be an emergency. Get help right away. Call 911. Do not wait to see if the symptoms will go away. Do not drive yourself to the hospital. Summary Hypertension is when the force of blood pumping through your arteries is too strong. If this condition is not controlled, it may put you at risk for serious complications. Your personal target blood pressure may  vary depending on your medical conditions, your age, and other factors. For most people, a normal blood pressure is less than 120/80. Hypertension is treated with lifestyle changes, medicines, or a combination of both. Lifestyle changes include losing weight, eating a healthy, low-sodium diet, exercising more, and limiting alcohol. This information is not intended to replace advice given to you by your health care provider. Make sure you discuss any questions you have with your health care provider. Document Revised: 05/21/2021 Document Reviewed:  05/21/2021 Elsevier Patient Education  2024 [employer].

## 2023-12-14 NOTE — Assessment & Plan Note (Signed)
 Elevated blood pressure reading in the office History of recurrent hypercalcemia Recommend to not use hydrochlorothiazide  We will change valsartan  160 mg daily to Amlodipine  10 mg Well-controlled diabetes with hemoglobin A1c of 5.5.  Patient with history of CKD.  Recommend to continue Farxiga  10 mg daily Cardiovascular risks associated with hypertension and diabetes discussed Diet and nutrition discussed Follow-up in 6 months

## 2023-12-24 ENCOUNTER — Encounter: Payer: Self-pay | Admitting: Emergency Medicine

## 2023-12-24 ENCOUNTER — Other Ambulatory Visit: Payer: Self-pay | Admitting: Radiology

## 2023-12-24 DIAGNOSIS — E785 Hyperlipidemia, unspecified: Secondary | ICD-10-CM

## 2023-12-24 MED ORDER — SIMVASTATIN 40 MG PO TABS: 40.0000 mg | ORAL_TABLET | Freq: Every day | ORAL | 3 refills | Status: AC

## 2024-01-15 ENCOUNTER — Encounter: Payer: Self-pay | Admitting: Emergency Medicine

## 2024-01-15 NOTE — Telephone Encounter (Signed)
 These are typical side effects from amlodipine .  Recommend to reduce dose to 5 mg daily and follow-up in the office.

## 2024-01-19 ENCOUNTER — Other Ambulatory Visit: Payer: Self-pay | Admitting: Emergency Medicine

## 2024-01-19 DIAGNOSIS — E1159 Type 2 diabetes mellitus with other circulatory complications: Secondary | ICD-10-CM

## 2024-01-19 NOTE — Telephone Encounter (Signed)
 Patient having amlodipine  side effects.  Recommend to decrease dose to 5 mg daily and continue monitoring blood pressure readings at home.  Thanks.

## 2024-01-20 NOTE — Telephone Encounter (Signed)
 Okay to cut in half and take 5 mg daily.

## 2024-02-10 ENCOUNTER — Ambulatory Visit (INDEPENDENT_AMBULATORY_CARE_PROVIDER_SITE_OTHER): Admitting: Podiatry

## 2024-02-10 DIAGNOSIS — M79674 Pain in right toe(s): Secondary | ICD-10-CM | POA: Diagnosis not present

## 2024-02-10 DIAGNOSIS — M79675 Pain in left toe(s): Secondary | ICD-10-CM | POA: Diagnosis not present

## 2024-02-10 DIAGNOSIS — B351 Tinea unguium: Secondary | ICD-10-CM | POA: Diagnosis not present

## 2024-02-10 NOTE — Progress Notes (Unsigned)
   Subjective:  Patient ID: Alexander Cochran, male    DOB: 08/13/1957,  MRN: [id number]  Alexander Cochran presents to clinic today for:  Chief Complaint  Patient presents with   Diabetes    dfc   Patient notes nails are thick, discolored, elongated and painful in shoegear when trying to ambulate.    PCP is Ivonne Andrew, NP.  Allergies  Allergen Reactions   Crab [Shellfish Allergy]     itching   Review of Systems: Negative except as noted in the HPI.  Objective:  Alexander Cochran is a pleasant 68 y.o. male in NAD. AAO x 3.  Vascular Examination: Capillary refill time is 3-5 seconds to toes bilateral. Palpable pedal pulses b/l LE. Digital hair present b/l. No pedal edema b/l. Skin temperature gradient WNL b/l. No varicosities b/l. No cyanosis or clubbing noted b/l.   Dermatological Examination: Pedal skin with normal turgor, texture and tone b/l. No open wounds. No interdigital macerations b/l. Toenails x10 are 3mm thick, discolored, dystrophic with subungual debris. There is pain with compression of the nail plates.  They are elongated x10     Latest Ref Rng & Units 02/26/2023    9:20 AM 11/26/2022   10:20 AM 08/25/2022   10:08 AM 08/25/2022    9:57 AM 05/23/2022   10:59 AM  Hemoglobin A1C  Hemoglobin-A1c 4.0 - 5.6 % 6.7  8.3  9.1  9.1    9.1    9.1    9.1  6.3    Assessment/Plan: 1. Pain due to onychomycosis of nail     The mycotic toenails were sharply debrided x10 with sterile nail nippers and a power debriding burr to decrease bulk/thickness and length.    Return in about 3 months (around 06/03/2023) for [facility]   [name], DPM, FACFAS [facility]     [address] [place], [place] [address]                Office [phone]  Fax [phone]

## 2024-03-25 ENCOUNTER — Encounter: Payer: Self-pay | Admitting: Emergency Medicine

## 2024-04-08 ENCOUNTER — Ambulatory Visit

## 2024-05-11 ENCOUNTER — Ambulatory Visit: Admitting: Podiatry

## 2024-05-11 DIAGNOSIS — M79675 Pain in left toe(s): Secondary | ICD-10-CM | POA: Diagnosis not present

## 2024-05-11 DIAGNOSIS — B351 Tinea unguium: Secondary | ICD-10-CM | POA: Diagnosis not present

## 2024-05-11 DIAGNOSIS — M79674 Pain in right toe(s): Secondary | ICD-10-CM

## 2024-05-11 NOTE — Progress Notes (Signed)
    Subjective:  Patient ID: Alexander Cochran, male    DOB: September 16, 1955,  MRN: [id number]  Alexander Cochran presents to clinic today for:  Chief Complaint  Patient presents with   [facility]    [facility] with callous on the callous.  A1c 5.5 in Aug No anti coag.   Patient notes nails are thick, discolored, elongated and painful in shoegear when trying to ambulate.    PCP is Sagardia, Emil Schanz, MD.  Past Medical History:  Diagnosis Date   Diabetes mellitus without complication (HCC)    Hyperlipidemia    Hypertension    Past Surgical History:  Procedure Laterality Date   CYST EXCISION PERINEAL  1984   Allergies  Allergen Reactions   Gabapentin  Other (See Comments)    Hallucinations     Review of Systems: Negative except as noted in the HPI.  Objective:  Alexander Cochran is a pleasant 68 y.o. male in NAD. AAO x 3.  Vascular Examination: Capillary refill time is 3-5 seconds to toes bilateral. Palpable pedal pulses b/l LE. Digital hair present b/l.  Skin temperature gradient WNL b/l. No varicosities b/l. No cyanosis noted b/l.   Dermatological Examination: Pedal skin with normal turgor, texture and tone b/l. No open wounds. No interdigital macerations b/l. Toenails x10 are 3mm thick, discolored, dystrophic with subungual debris. There is pain with compression of the nail plates.  They are elongated x10     Latest Ref Rng & Units 12/14/2023    1:11 PM 06/16/2023    9:57 AM  Hemoglobin A1C  Hemoglobin-A1c 4.0 - 5.6 % 5.5  6.3    Assessment/Plan: 1. Pain due to onychomycosis of toenails of both feet    The mycotic toenails were sharply debrided x10 with sterile nail nippers and a power debriding burr to decrease bulk/thickness and length.    Recommended Keranail solution for thickened, dry hyponychium once daily.   Return in about 3 months (around 08/11/2024) for [facility].   Alexander Cochran, DPM, FACFAS [facility]     [address] [place], [place] [address]                Office [phone]  Fax [phone]

## 2024-05-12 ENCOUNTER — Ambulatory Visit: Payer: Self-pay | Admitting: Emergency Medicine

## 2024-05-12 DIAGNOSIS — R195 Other fecal abnormalities: Secondary | ICD-10-CM | POA: Insufficient documentation

## 2024-05-12 LAB — COLOGUARD: COLOGUARD: POSITIVE — AB

## 2024-05-15 ENCOUNTER — Other Ambulatory Visit: Payer: Self-pay | Admitting: Emergency Medicine

## 2024-05-15 DIAGNOSIS — I152 Hypertension secondary to endocrine disorders: Secondary | ICD-10-CM

## 2024-05-20 ENCOUNTER — Encounter: Payer: Self-pay | Admitting: Emergency Medicine

## 2024-06-15 ENCOUNTER — Encounter: Payer: Self-pay | Admitting: Emergency Medicine

## 2024-06-15 ENCOUNTER — Ambulatory Visit: Admitting: Emergency Medicine

## 2024-06-15 ENCOUNTER — Ambulatory Visit: Payer: Self-pay | Admitting: Emergency Medicine

## 2024-06-15 VITALS — BP 160/100 | HR 94 | Temp 98.5°F | Ht 66.0 in | Wt 242.0 lb

## 2024-06-15 DIAGNOSIS — E785 Hyperlipidemia, unspecified: Secondary | ICD-10-CM | POA: Diagnosis not present

## 2024-06-15 DIAGNOSIS — E1159 Type 2 diabetes mellitus with other circulatory complications: Secondary | ICD-10-CM

## 2024-06-15 DIAGNOSIS — Z794 Long term (current) use of insulin: Secondary | ICD-10-CM

## 2024-06-15 DIAGNOSIS — I152 Hypertension secondary to endocrine disorders: Secondary | ICD-10-CM

## 2024-06-15 DIAGNOSIS — E1169 Type 2 diabetes mellitus with other specified complication: Secondary | ICD-10-CM | POA: Diagnosis not present

## 2024-06-15 DIAGNOSIS — N1832 Chronic kidney disease, stage 3b: Secondary | ICD-10-CM | POA: Diagnosis not present

## 2024-06-15 DIAGNOSIS — R195 Other fecal abnormalities: Secondary | ICD-10-CM

## 2024-06-15 DIAGNOSIS — N1831 Chronic kidney disease, stage 3a: Secondary | ICD-10-CM

## 2024-06-15 DIAGNOSIS — E1165 Type 2 diabetes mellitus with hyperglycemia: Secondary | ICD-10-CM | POA: Diagnosis not present

## 2024-06-15 LAB — MICROALBUMIN / CREATININE URINE RATIO
Creatinine,U: 112.1 mg/dL
Microalb Creat Ratio: 21.7 mg/g (ref 0.0–30.0)
Microalb, Ur: 2.4 mg/dL — ABNORMAL HIGH (ref 0.0–1.9)

## 2024-06-15 LAB — CBC WITH DIFFERENTIAL/PLATELET
Basophils Absolute: 0 K/uL (ref 0.0–0.1)
Basophils Relative: 0.4 % (ref 0.0–3.0)
Eosinophils Absolute: 0.1 K/uL (ref 0.0–0.7)
Eosinophils Relative: 2 % (ref 0.0–5.0)
HCT: 38.9 % — ABNORMAL LOW (ref 39.0–52.0)
Hemoglobin: 13.1 g/dL (ref 13.0–17.0)
Lymphocytes Relative: 33.6 % (ref 12.0–46.0)
Lymphs Abs: 1.6 K/uL (ref 0.7–4.0)
MCHC: 33.6 g/dL (ref 30.0–36.0)
MCV: 87.4 fl (ref 78.0–100.0)
Monocytes Absolute: 0.2 K/uL (ref 0.1–1.0)
Monocytes Relative: 5.1 % (ref 3.0–12.0)
Neutro Abs: 2.9 K/uL (ref 1.4–7.7)
Neutrophils Relative %: 58.9 % (ref 43.0–77.0)
Platelets: 115 K/uL — ABNORMAL LOW (ref 150.0–400.0)
RBC: 4.45 Mil/uL (ref 4.22–5.81)
RDW: 13.5 % (ref 11.5–15.5)
WBC: 4.9 K/uL (ref 4.0–10.5)

## 2024-06-15 LAB — COMPREHENSIVE METABOLIC PANEL WITH GFR
ALT: 19 U/L (ref 0–53)
AST: 19 U/L (ref 0–37)
Albumin: 4.7 g/dL (ref 3.5–5.2)
Alkaline Phosphatase: 67 U/L (ref 39–117)
BUN: 17 mg/dL (ref 6–23)
CO2: 28 meq/L (ref 19–32)
Calcium: 9.3 mg/dL (ref 8.4–10.5)
Chloride: 110 meq/L (ref 96–112)
Creatinine, Ser: 1.63 mg/dL — ABNORMAL HIGH (ref 0.40–1.50)
GFR: 43.35 mL/min — ABNORMAL LOW (ref >60.00)
Glucose, Bld: 98 mg/dL (ref 70–99)
Potassium: 3.3 meq/L — ABNORMAL LOW (ref 3.5–5.1)
Sodium: 146 meq/L — ABNORMAL HIGH (ref 135–145)
Total Bilirubin: 0.6 mg/dL (ref 0.2–1.2)
Total Protein: 7.4 g/dL (ref 6.0–8.3)

## 2024-06-15 LAB — POCT GLYCOSYLATED HEMOGLOBIN (HGB A1C): HbA1c POC (<> result, manual entry): 5.7 % (ref 4.0–5.6)

## 2024-06-15 LAB — LIPID PANEL
Cholesterol: 124 mg/dL (ref 0–200)
HDL: 33.5 mg/dL — ABNORMAL LOW (ref >39.00)
LDL Cholesterol: 35 mg/dL (ref 0–99)
NonHDL: 90.72
Total CHOL/HDL Ratio: 4
Triglycerides: 278 mg/dL — ABNORMAL HIGH (ref 0.0–149.0)
VLDL: 55.6 mg/dL — ABNORMAL HIGH (ref 0.0–40.0)

## 2024-06-15 MED ORDER — VALSARTAN 160 MG PO TABS: 160.0000 mg | ORAL_TABLET | Freq: Every day | ORAL | 3 refills | Status: AC

## 2024-06-15 MED ORDER — OZEMPIC (0.25 OR 0.5 MG/DOSE) 2 MG/3ML ~~LOC~~ SOPN: 0.2500 mg | PEN_INJECTOR | SUBCUTANEOUS | 3 refills | Status: AC

## 2024-06-15 MED ORDER — DAPAGLIFLOZIN PROPANEDIOL 10 MG PO TABS: 10.0000 mg | ORAL_TABLET | Freq: Every day | ORAL | 3 refills | Status: AC

## 2024-06-15 NOTE — Assessment & Plan Note (Signed)
 Diet and nutrition discussed Continue simvastatin  40 mg daily Well-controlled diabetes with hemoglobin A1c of 5.7 Cardiovascular risks associated with dyslipidemia and diabetes discussed

## 2024-06-15 NOTE — Assessment & Plan Note (Signed)
 Wt Readings from Last 3 Encounters:  06/15/24 242 lb (109.8 kg)  12/14/23 244 lb (110.7 kg)  06/16/23 244 lb 3.2 oz (110.8 kg)  Diet and nutrition discussed Advised to decrease amount of daily carbohydrate intake and daily calories and increase amount of plant-based protein in his diet. Will benefit from GLP-1 agonist Recommend weekly Ozempic

## 2024-06-15 NOTE — Patient Instructions (Signed)
 Hypertension, Adult High blood pressure (hypertension) is when the force of blood pumping through the arteries is too strong. The arteries are the blood vessels that carry blood from the heart throughout the body. Hypertension forces the heart to work harder to pump blood and may cause arteries to become narrow or stiff. Untreated or uncontrolled hypertension can lead to a heart attack, heart failure, a stroke, kidney disease, and other problems. A blood pressure reading consists of a higher number over a lower number. Ideally, your blood pressure should be below 120/80. The first ("top") number is called the systolic pressure. It is a measure of the pressure in your arteries as your heart beats. The second ("bottom") number is called the diastolic pressure. It is a measure of the pressure in your arteries as the heart relaxes. What are the causes? The exact cause of this condition is not known. There are some conditions that result in high blood pressure. What increases the risk? Certain factors may make you more likely to develop high blood pressure. Some of these risk factors are under your control, including: Smoking. Not getting enough exercise or physical activity. Being overweight. Having too much fat, sugar, calories, or salt (sodium) in your diet. Drinking too much alcohol. Other risk factors include: Having a personal history of heart disease, diabetes, high cholesterol, or kidney disease. Stress. Having a family history of high blood pressure and high cholesterol. Having obstructive sleep apnea. Age. The risk increases with age. What are the signs or symptoms? High blood pressure may not cause symptoms. Very high blood pressure (hypertensive crisis) may cause: Headache. Fast or irregular heartbeats (palpitations). Shortness of breath. Nosebleed. Nausea and vomiting. Vision changes. Severe chest pain, dizziness, and seizures. How is this diagnosed? This condition is diagnosed by  measuring your blood pressure while you are seated, with your arm resting on a flat surface, your legs uncrossed, and your feet flat on the floor. The cuff of the blood pressure monitor will be placed directly against the skin of your upper arm at the level of your heart. Blood pressure should be measured at least twice using the same arm. Certain conditions can cause a difference in blood pressure between your right and left arms. If you have a high blood pressure reading during one visit or you have normal blood pressure with other risk factors, you may be asked to: Return on a different day to have your blood pressure checked again. Monitor your blood pressure at home for 1 week or longer. If you are diagnosed with hypertension, you may have other blood or imaging tests to help your health care provider understand your overall risk for other conditions. How is this treated? This condition is treated by making healthy lifestyle changes, such as eating healthy foods, exercising more, and reducing your alcohol intake. You may be referred for counseling on a healthy diet and physical activity. Your health care provider may prescribe medicine if lifestyle changes are not enough to get your blood pressure under control and if: Your systolic blood pressure is above 130. Your diastolic blood pressure is above 80. Your personal target blood pressure may vary depending on your medical conditions, your age, and other factors. Follow these instructions at home: Eating and drinking  Eat a diet that is high in fiber and potassium, and low in sodium, added sugar, and fat. An example of this eating plan is called the DASH diet. DASH stands for Dietary Approaches to Stop Hypertension. To eat this way: Eat  plenty of fresh fruits and vegetables. Try to fill one half of your plate at each meal with fruits and vegetables. Eat whole grains, such as whole-wheat pasta, brown rice, or whole-grain bread. Fill about one  fourth of your plate with whole grains. Eat or drink low-fat dairy products, such as skim milk or low-fat yogurt. Avoid fatty cuts of meat, processed or cured meats, and poultry with skin. Fill about one fourth of your plate with lean proteins, such as fish, chicken without skin, beans, eggs, or tofu. Avoid pre-made and processed foods. These tend to be higher in sodium, added sugar, and fat. Reduce your daily sodium intake. Many people with hypertension should eat less than 1,500 mg of sodium a day. Do not drink alcohol if: Your health care provider tells you not to drink. You are pregnant, may be pregnant, or are planning to become pregnant. If you drink alcohol: Limit how much you have to: 0-1 drink a day for women. 0-2 drinks a day for men. Know how much alcohol is in your drink. In the [place] one drink equals one 12 oz bottle of beer (355 mL), one 5 oz glass of wine (148 mL), or one 1 oz glass of hard liquor (44 mL). Lifestyle  Work with your health care provider to maintain a healthy body weight or to lose weight. Ask what an ideal weight is for you. Get at least 30 minutes of exercise that causes your heart to beat faster (aerobic exercise) most days of the week. Activities may include walking, swimming, or biking. Include exercise to strengthen your muscles (resistance exercise), such as Pilates or lifting weights, as part of your weekly exercise routine. Try to do these types of exercises for 30 minutes at least 3 days a week. Do not use any products that contain nicotine or tobacco. These products include cigarettes, chewing tobacco, and vaping devices, such as e-cigarettes. If you need help quitting, ask your health care provider. Monitor your blood pressure at home as told by your health care provider. Keep all follow-up visits. This is important. Medicines Take over-the-counter and prescription medicines only as told by your health care provider. Follow directions carefully. Blood  pressure medicines must be taken as prescribed. Do not skip doses of blood pressure medicine. Doing this puts you at risk for problems and can make the medicine less effective. Ask your health care provider about side effects or reactions to medicines that you should watch for. Contact a health care provider if you: Think you are having a reaction to a medicine you are taking. Have headaches that keep coming back (recurring). Feel dizzy. Have swelling in your ankles. Have trouble with your vision. Get help right away if you: Develop a severe headache or confusion. Have unusual weakness or numbness. Feel faint. Have severe pain in your chest or abdomen. Vomit repeatedly. Have trouble breathing. These symptoms may be an emergency. Get help right away. Call 911. Do not wait to see if the symptoms will go away. Do not drive yourself to the hospital. Summary Hypertension is when the force of blood pumping through your arteries is too strong. If this condition is not controlled, it may put you at risk for serious complications. Your personal target blood pressure may vary depending on your medical conditions, your age, and other factors. For most people, a normal blood pressure is less than 120/80. Hypertension is treated with lifestyle changes, medicines, or a combination of both. Lifestyle changes include losing weight, eating a healthy,  low-sodium diet, exercising more, and limiting alcohol. This information is not intended to replace advice given to you by your health care provider. Make sure you discuss any questions you have with your health care provider. Document Revised: 05/21/2021 Document Reviewed: 05/21/2021 Elsevier Patient Education  2024 [employer].

## 2024-06-15 NOTE — Progress Notes (Signed)
 Alexander Cochran 68 y.o.   Chief Complaint  Patient presents with   Follow-up    Pt states that the amlodipine  is still making him have headaches     HISTORY OF PRESENT ILLNESS: This is a 68 y.o. male here for follow-up of chronic medical conditions including hypertension and diabetes #1 hypertension: Amlodipine  causing headaches and lightheadedness Has been off blood pressure medication for the last 3 weeks #2 diabetes: Not on any medication.  Inquiring about GLP-1 agonists and weight loss  HPI   Prior to Admission medications   Medication Sig Start Date End Date Taking? Authorizing Provider  amLODipine  (NORVASC ) 5 MG tablet Take 5 mg by mouth daily. Patient not taking: Reported on 06/15/2024    [provider]  dapagliflozin  propanediol (FARXIGA ) 10 MG TABS tablet Take 1 tablet (10 mg total) by mouth daily before breakfast. 06/16/23   Purcell Emil Schanz, MD  ketoconazole  (NIZORAL ) 2 % cream Apply 1gm to both feet and between toes once daily 08/20/23   McCaughan, Dia D, DPM  simvastatin  (ZOCOR ) 40 MG tablet Take 1 tablet (40 mg total) by mouth daily. 12/24/23   Purcell Emil Schanz, MD    Allergies  Allergen Reactions   Gabapentin  Other (See Comments)    Hallucinations     Patient Active Problem List   Diagnosis Date Noted   Stage 3b chronic kidney disease (HCC) 06/15/2024   Positive colorectal cancer screening using Cologuard test 05/12/2024   Bilateral tinnitus 09/09/2022   Multinodular goiter 08/01/2021   Hypertension associated with type 2 diabetes mellitus (HCC) 12/26/2020   Type 1 diabetes (HCC) 03/13/2020   Diabetic polyneuropathy associated with type 2 diabetes mellitus (HCC) 09/26/2019   Type 2 diabetes mellitus with hyperglycemia (HCC) 09/26/2019   Dyslipidemia associated with type 2 diabetes mellitus (HCC) 09/26/2019   Stage 3a chronic kidney disease (HCC) 09/26/2019   Left renal mass 08/30/2018   Type 2 MI (myocardial infarction) (HCC) 08/30/2018    Morbid obesity (HCC) 08/28/2018   BMI 38.0-38.9,adult 10/21/2011   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   Hyperlipemia 10/21/2011    Past Medical History:  Diagnosis Date   Diabetes mellitus without complication (HCC)    Hyperlipidemia    Hypertension     Past Surgical History:  Procedure Laterality Date   CYST EXCISION PERINEAL  1984    Social History   Socioeconomic History   Marital status: Married    Spouse name: Not on file   Number of children: Not on file   Years of education: Not on file   Highest education level: Not on file  Occupational History   Not on file  Tobacco Use   Smoking status: Former    Current packs/day: 0.00    Average packs/day: 0.3 packs/day for 2.0 years (0.5 ttl pk-yrs)    Types: Cigarettes    Start date: 12/29/1971    Quit date: 12/28/1973    Years since quitting: 50.4   Smokeless tobacco: Never  Substance and Sexual Activity   Alcohol use: Yes   Drug use: No   Sexual activity: Never  Other Topics Concern   Not on file  Social History Narrative   Not on file   Social Drivers of Health   Financial Resource Strain: Low Risk (08/28/2018)   Received [facility]   Overall Financial Resource Strain (CARDIA)    Difficulty of Paying Living Expenses: Not very hard  Food Insecurity: No Food Insecurity (08/28/2018)   Received [facility]  Hunger Vital Sign    Worried About Running Out of Food in the Last Year: Never true    Ran Out of Food in the Last Year: Never true  Transportation Needs: No Transportation Needs (08/28/2018)   Received from [facility] - Transportation    Lack of Transportation (Medical): No    Lack of Transportation (Non-Medical): No  Physical Activity: Inactive (08/28/2018)   Received from [facility]   Exercise Vital Sign    Days of Exercise per Week: 0 days    Minutes of Exercise per Session: 0 min  Stress: No Stress Concern Present (08/28/2018)   Received from [facility] - Occupational Stress Questionnaire    Feeling of Stress : Only a little  Social Connections: Unknown (08/28/2018)   Received from [facility]   Social Connection and Isolation Panel    Frequency of Communication with Friends and Family: More than three times a week    Frequency of Social Gatherings with Friends and Family: More than three times a week    Attends Religious Services: Not on file    Active Member of Clubs or Organizations: Not on file    Attends Banker Meetings: Not on file    Marital Status: Not on file  Intimate Partner Violence: Not At Risk (08/28/2018)   Received from [facility]   Humiliation, Afraid, Rape, and Kick questionnaire    Fear of Current or Ex-Partner: No    Emotionally Abused: No    Physically Abused: No    Sexually Abused: No    Family History  Problem Relation Age of Onset   Diabetes Mother    Hypertension Sister    Hypertension Brother      Review of Systems  Constitutional: Negative.  Negative for chills and fever.  HENT: Negative.  Negative for congestion and sore throat.   Respiratory: Negative.  Negative for cough and shortness of breath.   Cardiovascular: Negative.  Negative for chest pain and palpitations.  Gastrointestinal:  Negative for abdominal pain, diarrhea, nausea and vomiting.  Genitourinary: Negative.  Negative for dysuria and hematuria.  Skin: Negative.   Neurological: Negative.  Negative for dizziness and headaches.  All other systems reviewed and are negative.   Vitals:   06/15/24 0949  BP: (!) 160/100  Pulse: 94  Temp: 98.5 F (36.9 C)  SpO2: 98%    Physical Exam Vitals reviewed.  Constitutional:      Appearance: Normal appearance.  HENT:     Head: Normocephalic.     Mouth/Throat:     Mouth: Mucous membranes are moist.     Pharynx: Oropharynx is clear.  Eyes:     Extraocular Movements: Extraocular movements intact.     Conjunctiva/sclera:  Conjunctivae normal.     Pupils: Pupils are equal, round, and reactive to light.  Cardiovascular:     Rate and Rhythm: Normal rate and regular rhythm.     Pulses: Normal pulses.     Heart sounds: Normal heart sounds.  Pulmonary:     Effort: Pulmonary effort is normal.     Breath sounds: Normal breath sounds.  Abdominal:     Palpations: Abdomen is soft.     Tenderness: There is no abdominal tenderness.  Musculoskeletal:     Cervical back: No tenderness.  Lymphadenopathy:     Cervical: No cervical adenopathy.  Skin:    General: Skin is warm and dry.  Capillary Refill: Capillary refill takes less than 2 seconds.  Neurological:     General: No focal deficit present.     Mental Status: He is alert and oriented to person, place, and time.  Psychiatric:        Mood and Affect: Mood normal.        Behavior: Behavior normal.     Results for orders placed or performed in visit on 06/15/24 (from the past 24 hours)  POCT HgB A1C     Status: Normal   Collection Time: 06/15/24 10:06 AM  Result Value Ref Range   Hemoglobin A1C     HbA1c POC (<> result, manual entry) 5.7 4.0 - 5.6 %   HbA1c, POC (prediabetic range)     HbA1c, POC (controlled diabetic range)      ASSESSMENT & PLAN: A total of 44 minutes was spent with the patient and counseling/coordination of care regarding preparing for this visit, review of most recent office visit notes, review of multiple chronic medical conditions and their management, cardiovascular risks associated with hypertension and diabetes, review of all medications and changes made, review of most recent bloodwork results including interpretation of today's hemoglobin A1c, review of health maintenance items, education on nutrition, prognosis, documentation, and need for follow up.   Problem List Items Addressed This Visit       Cardiovascular and Mediastinum   Hypertension associated with type 2 diabetes mellitus (HCC) - Primary   BP Readings from Last  3 Encounters:  06/15/24 (!) 160/100  12/14/23 (!) 184/104  06/16/23 (!) 140/88   Lab Results  Component Value Date   HGBA1C 5.7 06/15/2024  Uncontrolled hypertension.  Has been off blood pressure medication for 3 weeks. Intolerant to amlodipine  Recommend to start valsartan  160 mg daily Well-controlled diabetes.  Continue Farxiga  10 mg daily Recommend weekly Ozempic Cardiovascular risks associated with hypertension and diabetes discussed Diet and nutrition discussed Follow-up in 3 months       Relevant Medications   Semaglutide ,0.25 or 0.5MG /DOS, (OZEMPIC, 0.25 OR 0.5 MG/DOSE,) 2 MG/3ML SOPN   dapagliflozin  propanediol (FARXIGA ) 10 MG TABS tablet   valsartan  (DIOVAN ) 160 MG tablet   Other Relevant Orders   Microalbumin / creatinine urine ratio   Comprehensive metabolic panel with GFR   CBC with Differential/Platelet   Lipid panel     Endocrine   Type 2 diabetes mellitus with hyperglycemia (HCC)   Relevant Medications   Semaglutide ,0.25 or 0.5MG /DOS, (OZEMPIC, 0.25 OR 0.5 MG/DOSE,) 2 MG/3ML SOPN   dapagliflozin  propanediol (FARXIGA ) 10 MG TABS tablet   valsartan  (DIOVAN ) 160 MG tablet   Other Relevant Orders   POCT HgB A1C (Completed)   Microalbumin / creatinine urine ratio   Dyslipidemia associated with type 2 diabetes mellitus (HCC)   Diet and nutrition discussed Continue simvastatin  40 mg daily Well-controlled diabetes with hemoglobin A1c of 5.7 Cardiovascular risks associated with dyslipidemia and diabetes discussed      Relevant Medications   Semaglutide ,0.25 or 0.5MG /DOS, (OZEMPIC, 0.25 OR 0.5 MG/DOSE,) 2 MG/3ML SOPN   dapagliflozin  propanediol (FARXIGA ) 10 MG TABS tablet   valsartan  (DIOVAN ) 160 MG tablet   Other Relevant Orders   Comprehensive metabolic panel with GFR   CBC with Differential/Platelet   Lipid panel     Genitourinary   Stage 3a chronic kidney disease (HCC)   Stage 3b chronic kidney disease (HCC)   Advised to stay well-hydrated and avoid  NSAIDs Continue Farxiga  10 mg daily Recommend blood work today  Relevant Medications   dapagliflozin  propanediol (FARXIGA ) 10 MG TABS tablet     Other   Morbid obesity (HCC)   Wt Readings from Last 3 Encounters:  06/15/24 242 lb (109.8 kg)  12/14/23 244 lb (110.7 kg)  06/16/23 244 lb 3.2 oz (110.8 kg)  Diet and nutrition discussed Advised to decrease amount of daily carbohydrate intake and daily calories and increase amount of plant-based protein in his diet. Will benefit from GLP-1 agonist Recommend weekly Ozempic        Relevant Medications   Semaglutide ,0.25 or 0.5MG /DOS, (OZEMPIC , 0.25 OR 0.5 MG/DOSE,) 2 MG/3ML SOPN   dapagliflozin  propanediol (FARXIGA ) 10 MG TABS tablet   Positive colorectal cancer screening using Cologuard test   Aware of positive test Awaiting for GI call for colonoscopy schedule      Patient Instructions  Hypertension, Adult High blood pressure (hypertension) is when the force of blood pumping through the arteries is too strong. The arteries are the blood vessels that carry blood from the heart throughout the body. Hypertension forces the heart to work harder to pump blood and may cause arteries to become narrow or stiff. Untreated or uncontrolled hypertension can lead to a heart attack, heart failure, a stroke, kidney disease, and other problems. A blood pressure reading consists of a higher number over a lower number. Ideally, your blood pressure should be below 120/80. The first (top) number is called the systolic pressure. It is a measure of the pressure in your arteries as your heart beats. The second (bottom) number is called the diastolic pressure. It is a measure of the pressure in your arteries as the heart relaxes. What are the causes? The exact cause of this condition is not known. There are some conditions that result in high blood pressure. What increases the risk? Certain factors may make you more likely to develop high blood  pressure. Some of these risk factors are under your control, including: Smoking. Not getting enough exercise or physical activity. Being overweight. Having too much fat, sugar, calories, or salt (sodium) in your diet. Drinking too much alcohol. Other risk factors include: Having a personal history of heart disease, diabetes, high cholesterol, or kidney disease. Stress. Having a family history of high blood pressure and high cholesterol. Having obstructive sleep apnea. Age. The risk increases with age. What are the signs or symptoms? High blood pressure may not cause symptoms. Very high blood pressure (hypertensive crisis) may cause: Headache. Fast or irregular heartbeats (palpitations). Shortness of breath. Nosebleed. Nausea and vomiting. Vision changes. Severe chest pain, dizziness, and seizures. How is this diagnosed? This condition is diagnosed by measuring your blood pressure while you are seated, with your arm resting on a flat surface, your legs uncrossed, and your feet flat on the floor. The cuff of the blood pressure monitor will be placed directly against the skin of your upper arm at the level of your heart. Blood pressure should be measured at least twice using the same arm. Certain conditions can cause a difference in blood pressure between your right and left arms. If you have a high blood pressure reading during one visit or you have normal blood pressure with other risk factors, you may be asked to: Return on a different day to have your blood pressure checked again. Monitor your blood pressure at home for 1 week or longer. If you are diagnosed with hypertension, you may have other blood or imaging tests to help your health care provider understand your overall risk for other conditions.  How is this treated? This condition is treated by making healthy lifestyle changes, such as eating healthy foods, exercising more, and reducing your alcohol intake. You may be referred for  counseling on a healthy diet and physical activity. Your health care provider may prescribe medicine if lifestyle changes are not enough to get your blood pressure under control and if: Your systolic blood pressure is above 130. Your diastolic blood pressure is above 80. Your personal target blood pressure may vary depending on your medical conditions, your age, and other factors. Follow these instructions at home: Eating and drinking  Eat a diet that is high in fiber and potassium, and low in sodium, added sugar, and fat. An example of this eating plan is called the DASH diet. DASH stands for Dietary Approaches to Stop Hypertension. To eat this way: Eat plenty of fresh fruits and vegetables. Try to fill one half of your plate at each meal with fruits and vegetables. Eat whole grains, such as whole-wheat pasta, brown rice, or whole-grain bread. Fill about one fourth of your plate with whole grains. Eat or drink low-fat dairy products, such as skim milk or low-fat yogurt. Avoid fatty cuts of meat, processed or cured meats, and poultry with skin. Fill about one fourth of your plate with lean proteins, such as fish, chicken without skin, beans, eggs, or tofu. Avoid pre-made and processed foods. These tend to be higher in sodium, added sugar, and fat. Reduce your daily sodium intake. Many people with hypertension should eat less than 1,500 mg of sodium a day. Do not drink alcohol if: Your health care provider tells you not to drink. You are pregnant, may be pregnant, or are planning to become pregnant. If you drink alcohol: Limit how much you have to: 0-1 drink a day for women. 0-2 drinks a day for men. Know how much alcohol is in your drink. In the [place] one drink equals one 12 oz bottle of beer (355 mL), one 5 oz glass of wine (148 mL), or one 1 oz glass of hard liquor (44 mL). Lifestyle  Work with your health care provider to maintain a healthy body weight or to lose weight. Ask what an  ideal weight is for you. Get at least 30 minutes of exercise that causes your heart to beat faster (aerobic exercise) most days of the week. Activities may include walking, swimming, or biking. Include exercise to strengthen your muscles (resistance exercise), such as Pilates or lifting weights, as part of your weekly exercise routine. Try to do these types of exercises for 30 minutes at least 3 days a week. Do not use any products that contain nicotine or tobacco. These products include cigarettes, chewing tobacco, and vaping devices, such as e-cigarettes. If you need help quitting, ask your health care provider. Monitor your blood pressure at home as told by your health care provider. Keep all follow-up visits. This is important. Medicines Take over-the-counter and prescription medicines only as told by your health care provider. Follow directions carefully. Blood pressure medicines must be taken as prescribed. Do not skip doses of blood pressure medicine. Doing this puts you at risk for problems and can make the medicine less effective. Ask your health care provider about side effects or reactions to medicines that you should watch for. Contact a health care provider if you: Think you are having a reaction to a medicine you are taking. Have headaches that keep coming back (recurring). Feel dizzy. Have swelling in your ankles. Have trouble with  your vision. Get help right away if you: Develop a severe headache or confusion. Have unusual weakness or numbness. Feel faint. Have severe pain in your chest or abdomen. Vomit repeatedly. Have trouble breathing. These symptoms may be an emergency. Get help right away. Call 911. Do not wait to see if the symptoms will go away. Do not drive yourself to the hospital. Summary Hypertension is when the force of blood pumping through your arteries is too strong. If this condition is not controlled, it may put you at risk for serious complications. Your  personal target blood pressure may vary depending on your medical conditions, your age, and other factors. For most people, a normal blood pressure is less than 120/80. Hypertension is treated with lifestyle changes, medicines, or a combination of both. Lifestyle changes include losing weight, eating a healthy, low-sodium diet, exercising more, and limiting alcohol. This information is not intended to replace advice given to you by your health care provider. Make sure you discuss any questions you have with your health care provider. Document Revised: 05/21/2021 Document Reviewed: 05/21/2021 Elsevier Patient Education  2024 Elsevier Inc.     Emil Schaumann, MD [facility]

## 2024-06-15 NOTE — Assessment & Plan Note (Signed)
 BP Readings from Last 3 Encounters:  06/15/24 (!) 160/100  12/14/23 (!) 184/104  06/16/23 (!) 140/88   Lab Results  Component Value Date   HGBA1C 5.7 06/15/2024  Uncontrolled hypertension.  Has been off blood pressure medication for 3 weeks. Intolerant to amlodipine  Recommend to start valsartan  160 mg daily Well-controlled diabetes.  Continue Farxiga  10 mg daily Recommend weekly Ozempic Cardiovascular risks associated with hypertension and diabetes discussed Diet and nutrition discussed Follow-up in 3 months

## 2024-06-15 NOTE — Assessment & Plan Note (Signed)
 Aware of positive test Awaiting for GI call for colonoscopy schedule

## 2024-06-15 NOTE — Assessment & Plan Note (Signed)
 Advised to stay well-hydrated and avoid NSAIDs Continue Farxiga  10 mg daily Recommend blood work today

## 2024-06-29 ENCOUNTER — Encounter: Payer: Self-pay | Admitting: Emergency Medicine

## 2024-08-10 ENCOUNTER — Ambulatory Visit: Admitting: Podiatry

## 2024-08-10 DIAGNOSIS — M79674 Pain in right toe(s): Secondary | ICD-10-CM | POA: Diagnosis not present

## 2024-08-10 DIAGNOSIS — M79675 Pain in left toe(s): Secondary | ICD-10-CM | POA: Diagnosis not present

## 2024-08-10 DIAGNOSIS — B351 Tinea unguium: Secondary | ICD-10-CM

## 2024-08-10 NOTE — Progress Notes (Signed)
"   °  °  Subjective:  Patient ID: Alexander Cochran, male    DOB: 1955-08-25,  MRN: [id number]  Alexander Cochran presents to clinic today for:  Chief Complaint  Patient presents with   [facility]    [facility] no callus A1c was 5.7 in Dec.  No Anti coag   Patient notes nails are thick, discolored, elongated and painful in shoegear when trying to ambulate.    PCP is Alexander Cochran.  Past Medical History:  Diagnosis Date   Diabetes mellitus without complication (HCC)    Hyperlipidemia    Hypertension    Past Surgical History:  Procedure Laterality Date   CYST EXCISION PERINEAL  1984   Allergies  Allergen Reactions   Gabapentin  Other (See Comments)    Hallucinations     Review of Systems: Negative except as noted in the HPI.  Objective:  Alexander Cochran is a pleasant 69 y.o. male in NAD. AAO x 3.  Vascular Examination: Capillary refill time is 3-5 seconds to toes bilateral. Palpable pedal pulses b/l LE. Digital hair present b/l.  Skin temperature gradient WNL b/l. No varicosities b/l. No cyanosis noted b/l.   Dermatological Examination: Pedal skin with normal turgor, texture and tone b/l. No open wounds. No interdigital macerations b/l. Toenails x10 are 3mm thick, discolored, dystrophic with subungual debris. There is pain with compression of the nail plates.  They are elongated x10     Latest Ref Rng & Units 06/15/2024   10:06 AM 12/14/2023    1:11 PM  Hemoglobin A1C  Hemoglobin-A1c 4.0 - 5.6 % 5.7  5.5    Assessment/Plan: 1. Pain due to onychomycosis of toenails of both feet    The mycotic toenails were sharply debrided x10 with sterile nail nippers and a power debriding burr to decrease bulk/thickness and length.    Return in about 3 months (around 11/08/2024) for [name].   Alexander Cochran, DPM, FACFAS [facility]     [address] [place], [place] [address]                Office [phone]  Fax [phone] "

## 2024-09-15 ENCOUNTER — Ambulatory Visit: Admitting: Emergency Medicine

## 2024-11-09 ENCOUNTER — Ambulatory Visit: Admitting: Podiatry
